# Patient Record
Sex: Female | Born: 1960 | Race: White | Hispanic: No | Smoking: Never smoker
Health system: Southern US, Community
[De-identification: ages and names within clinical notes are randomized; demographics above are authoritative.]

## PROBLEM LIST (undated history)

## (undated) DIAGNOSIS — R87610 Atypical squamous cells of undetermined significance on cytologic smear of cervix (ASC-US): Secondary | ICD-10-CM

## (undated) DIAGNOSIS — I10 Essential (primary) hypertension: Secondary | ICD-10-CM

## (undated) DIAGNOSIS — J45909 Unspecified asthma, uncomplicated: Secondary | ICD-10-CM

## (undated) DIAGNOSIS — R945 Abnormal results of liver function studies: Secondary | ICD-10-CM

## (undated) DIAGNOSIS — E669 Obesity, unspecified: Secondary | ICD-10-CM

## (undated) DIAGNOSIS — K3184 Gastroparesis: Secondary | ICD-10-CM

## (undated) DIAGNOSIS — E039 Hypothyroidism, unspecified: Secondary | ICD-10-CM

## (undated) DIAGNOSIS — K219 Gastro-esophageal reflux disease without esophagitis: Secondary | ICD-10-CM

## (undated) DIAGNOSIS — K7689 Other specified diseases of liver: Secondary | ICD-10-CM

## (undated) DIAGNOSIS — F329 Major depressive disorder, single episode, unspecified: Secondary | ICD-10-CM

## (undated) DIAGNOSIS — J309 Allergic rhinitis, unspecified: Secondary | ICD-10-CM

## (undated) DIAGNOSIS — F411 Generalized anxiety disorder: Secondary | ICD-10-CM

## (undated) DIAGNOSIS — L650 Telogen effluvium: Secondary | ICD-10-CM

## (undated) DIAGNOSIS — D509 Iron deficiency anemia, unspecified: Secondary | ICD-10-CM

## (undated) DIAGNOSIS — R42 Dizziness and giddiness: Secondary | ICD-10-CM

## (undated) DIAGNOSIS — R740 Nonspecific elevation of levels of transaminase and lactic acid dehydrogenase [LDH]: Secondary | ICD-10-CM

## (undated) DIAGNOSIS — S63509A Unspecified sprain of unspecified wrist, initial encounter: Secondary | ICD-10-CM

## (undated) HISTORY — DX: Gastroparesis: K31.84

## (undated) HISTORY — DX: Other specified diseases of liver: K76.89

## (undated) HISTORY — DX: Obesity, unspecified: E66.9

## (undated) HISTORY — DX: Major depressive disorder, single episode, unspecified: F32.9

## (undated) HISTORY — DX: Hypothyroidism, unspecified: E03.9

## (undated) HISTORY — PX: ANTERIOR FUSION CERVICAL SPINE: SUR626

## (undated) HISTORY — DX: Unspecified sprain of unspecified wrist, initial encounter: S63.509A

## (undated) HISTORY — DX: Telogen effluvium: L65.0

## (undated) HISTORY — PX: CATARACT EXTRACTION: SUR2

## (undated) HISTORY — PX: TUBAL LIGATION: SHX77

## (undated) HISTORY — DX: Unspecified asthma, uncomplicated: J45.909

## (undated) HISTORY — PX: KNEE ARTHROSCOPY: SHX127

## (undated) HISTORY — DX: Essential (primary) hypertension: I10

## (undated) HISTORY — PX: KNEE SURGERY: SHX244

## (undated) HISTORY — DX: Abnormal results of liver function studies: R94.5

## (undated) HISTORY — DX: Gastro-esophageal reflux disease without esophagitis: K21.9

## (undated) HISTORY — DX: Nonspecific elevation of levels of transaminase and lactic acid dehydrogenase (ldh): R74.0

## (undated) HISTORY — PX: POSTERIOR CERVICAL LAMINECTOMY: SHX2248

## (undated) HISTORY — DX: Allergic rhinitis, unspecified: J30.9

## (undated) HISTORY — DX: Iron deficiency anemia, unspecified: D50.9

## (undated) HISTORY — DX: Generalized anxiety disorder: F41.1

## (undated) HISTORY — DX: Atypical squamous cells of undetermined significance on cytologic smear of cervix (ASC-US): R87.610

## (undated) HISTORY — DX: Dizziness and giddiness: R42

---

## 1998-01-21 ENCOUNTER — Encounter: Payer: Self-pay | Admitting: Family Medicine

## 1998-01-21 LAB — CONVERTED CEMR LAB: Pap Smear: NORMAL

## 2005-03-04 ENCOUNTER — Emergency Department (HOSPITAL_COMMUNITY): Admission: EM | Admit: 2005-03-04 | Discharge: 2005-03-05 | Payer: Self-pay | Admitting: Emergency Medicine

## 2005-10-17 ENCOUNTER — Ambulatory Visit: Payer: Self-pay | Admitting: Internal Medicine

## 2005-11-22 ENCOUNTER — Ambulatory Visit: Payer: Self-pay | Admitting: Family Medicine

## 2006-01-22 ENCOUNTER — Ambulatory Visit: Payer: Self-pay | Admitting: Family Medicine

## 2006-02-04 ENCOUNTER — Ambulatory Visit: Payer: Self-pay | Admitting: Family Medicine

## 2006-02-04 LAB — CONVERTED CEMR LAB
AST: 29 units/L (ref 0–37)
Alkaline Phosphatase: 66 units/L (ref 39–117)
Calcium: 9.3 mg/dL (ref 8.4–10.5)
Creatinine, Ser: 1.2 mg/dL (ref 0.4–1.2)
Glucose, Bld: 100 mg/dL — ABNORMAL HIGH (ref 70–99)
HDL: 42 mg/dL (ref >39.0)
TSH: 1.63 microintl units/mL (ref 0.35–5.50)
Total Bilirubin: 1 mg/dL (ref 0.3–1.2)
Total Protein: 7 g/dL (ref 6.0–8.3)
VLDL: 15 mg/dL (ref 0–40)

## 2006-05-02 ENCOUNTER — Ambulatory Visit: Payer: Self-pay | Admitting: Family Medicine

## 2006-09-12 ENCOUNTER — Telehealth: Payer: Self-pay | Admitting: Family Medicine

## 2007-02-06 ENCOUNTER — Encounter: Payer: Self-pay | Admitting: Family Medicine

## 2007-02-06 DIAGNOSIS — E039 Hypothyroidism, unspecified: Secondary | ICD-10-CM

## 2007-02-06 DIAGNOSIS — J309 Allergic rhinitis, unspecified: Secondary | ICD-10-CM

## 2007-02-06 DIAGNOSIS — J45909 Unspecified asthma, uncomplicated: Secondary | ICD-10-CM

## 2007-02-06 DIAGNOSIS — F320 Major depressive disorder, single episode, mild: Secondary | ICD-10-CM | POA: Insufficient documentation

## 2007-02-06 DIAGNOSIS — F329 Major depressive disorder, single episode, unspecified: Secondary | ICD-10-CM

## 2007-02-06 DIAGNOSIS — E669 Obesity, unspecified: Secondary | ICD-10-CM

## 2007-02-06 DIAGNOSIS — K219 Gastro-esophageal reflux disease without esophagitis: Secondary | ICD-10-CM

## 2007-02-06 DIAGNOSIS — F3289 Other specified depressive episodes: Secondary | ICD-10-CM

## 2007-02-06 HISTORY — DX: Major depressive disorder, single episode, unspecified: F32.9

## 2007-02-06 HISTORY — DX: Allergic rhinitis, unspecified: J30.9

## 2007-02-06 HISTORY — DX: Gastro-esophageal reflux disease without esophagitis: K21.9

## 2007-02-06 HISTORY — DX: Unspecified asthma, uncomplicated: J45.909

## 2007-02-06 HISTORY — DX: Other specified depressive episodes: F32.89

## 2007-02-06 HISTORY — DX: Obesity, unspecified: E66.9

## 2007-02-06 HISTORY — DX: Hypothyroidism, unspecified: E03.9

## 2007-02-09 ENCOUNTER — Encounter (INDEPENDENT_AMBULATORY_CARE_PROVIDER_SITE_OTHER): Payer: Self-pay | Admitting: *Deleted

## 2007-02-09 ENCOUNTER — Ambulatory Visit: Payer: Self-pay | Admitting: Family Medicine

## 2007-02-10 DIAGNOSIS — R945 Abnormal results of liver function studies: Secondary | ICD-10-CM

## 2007-02-10 HISTORY — DX: Abnormal results of liver function studies: R94.5

## 2007-02-10 LAB — CONVERTED CEMR LAB
ALT: 52 units/L — ABNORMAL HIGH (ref 0–35)
CO2: 25 meq/L (ref 19–32)
Calcium: 9 mg/dL (ref 8.4–10.5)
GFR calc non Af Amer: 71 mL/min
HDL: 41.5 mg/dL (ref >39.0)
LDL Cholesterol: 91 mg/dL (ref 0–99)
Potassium: 4.1 meq/L (ref 3.5–5.1)
Sodium: 135 meq/L (ref 135–145)
Total CHOL/HDL Ratio: 3.7
Total Protein: 6.8 g/dL (ref 6.0–8.3)
Triglycerides: 109 mg/dL (ref 0–149)

## 2007-02-13 ENCOUNTER — Ambulatory Visit: Payer: Self-pay | Admitting: Family Medicine

## 2007-02-13 DIAGNOSIS — R42 Dizziness and giddiness: Secondary | ICD-10-CM

## 2007-02-13 HISTORY — DX: Dizziness and giddiness: R42

## 2007-02-25 ENCOUNTER — Ambulatory Visit: Payer: Self-pay | Admitting: Family Medicine

## 2007-03-04 LAB — CONVERTED CEMR LAB
ALT: 65 units/L — ABNORMAL HIGH (ref 0–35)
AST: 59 units/L — ABNORMAL HIGH (ref 0–37)
Alkaline Phosphatase: 66 units/L (ref 39–117)
Bilirubin, Direct: 0.1 mg/dL (ref 0.0–0.3)
Eosinophils Relative: 4.9 % (ref 0.0–5.0)
HCT: 36.6 % (ref 36.0–46.0)
Hemoglobin: 12.3 g/dL (ref 12.0–15.0)
Hgb A1c MFr Bld: 5.2 % (ref 4.6–6.0)
Lymphocytes Relative: 26.5 % (ref 12.0–46.0)
MCHC: 33.5 g/dL (ref 30.0–36.0)
MCV: 84.7 fL (ref 78.0–100.0)
Monocytes Relative: 5.5 % (ref 3.0–11.0)
Platelets: 234 10*3/uL (ref 150–400)
Total Bilirubin: 0.8 mg/dL (ref 0.3–1.2)

## 2007-03-09 ENCOUNTER — Encounter: Admission: RE | Admit: 2007-03-09 | Discharge: 2007-03-09 | Payer: Self-pay | Admitting: Family Medicine

## 2007-04-16 ENCOUNTER — Encounter: Payer: Self-pay | Admitting: Family Medicine

## 2007-04-16 ENCOUNTER — Ambulatory Visit: Payer: Self-pay | Admitting: Family Medicine

## 2007-04-16 ENCOUNTER — Other Ambulatory Visit: Admission: RE | Admit: 2007-04-16 | Discharge: 2007-04-16 | Payer: Self-pay | Admitting: Family Medicine

## 2007-04-16 DIAGNOSIS — I10 Essential (primary) hypertension: Secondary | ICD-10-CM

## 2007-04-16 HISTORY — DX: Essential (primary) hypertension: I10

## 2007-04-23 DIAGNOSIS — R87610 Atypical squamous cells of undetermined significance on cytologic smear of cervix (ASC-US): Secondary | ICD-10-CM | POA: Insufficient documentation

## 2007-04-23 HISTORY — DX: Atypical squamous cells of undetermined significance on cytologic smear of cervix (ASC-US): R87.610

## 2007-04-28 ENCOUNTER — Encounter: Admission: RE | Admit: 2007-04-28 | Discharge: 2007-04-28 | Payer: Self-pay | Admitting: Family Medicine

## 2007-05-19 ENCOUNTER — Ambulatory Visit: Payer: Self-pay | Admitting: Family Medicine

## 2007-05-19 DIAGNOSIS — R002 Palpitations: Secondary | ICD-10-CM | POA: Insufficient documentation

## 2007-05-19 DIAGNOSIS — R0789 Other chest pain: Secondary | ICD-10-CM | POA: Insufficient documentation

## 2007-05-19 DIAGNOSIS — R1011 Right upper quadrant pain: Secondary | ICD-10-CM | POA: Insufficient documentation

## 2007-06-02 ENCOUNTER — Ambulatory Visit: Payer: Self-pay | Admitting: Gastroenterology

## 2007-06-02 DIAGNOSIS — F411 Generalized anxiety disorder: Secondary | ICD-10-CM | POA: Insufficient documentation

## 2007-06-02 DIAGNOSIS — R7402 Elevation of levels of lactic acid dehydrogenase (LDH): Secondary | ICD-10-CM

## 2007-06-02 DIAGNOSIS — R7401 Elevation of levels of liver transaminase levels: Secondary | ICD-10-CM

## 2007-06-02 HISTORY — DX: Generalized anxiety disorder: F41.1

## 2007-06-02 HISTORY — DX: Elevation of levels of lactic acid dehydrogenase (LDH): R74.02

## 2007-06-02 HISTORY — DX: Elevation of levels of liver transaminase levels: R74.01

## 2007-06-02 LAB — CONVERTED CEMR LAB
A-1 Antitrypsin, Ser: 209 mg/dL — ABNORMAL HIGH (ref 83–200)
Anti Nuclear Antibody(ANA): NEGATIVE
Ceruloplasmin: 45 mg/dL (ref 21–63)

## 2007-06-03 ENCOUNTER — Ambulatory Visit: Payer: Self-pay | Admitting: Cardiology

## 2007-06-08 ENCOUNTER — Encounter: Payer: Self-pay | Admitting: Gastroenterology

## 2007-06-10 ENCOUNTER — Ambulatory Visit: Payer: Self-pay | Admitting: Gastroenterology

## 2007-06-10 LAB — CONVERTED CEMR LAB
AST: 92 units/L — ABNORMAL HIGH (ref 0–37)
Alkaline Phosphatase: 64 units/L (ref 39–117)
Iron: 31 ug/dL — ABNORMAL LOW (ref 42–145)
Saturation Ratios: 6.3 % — ABNORMAL LOW (ref 20.0–50.0)
Transferrin: 351.2 mg/dL (ref >=212.0)

## 2007-06-11 ENCOUNTER — Telehealth: Payer: Self-pay | Admitting: Family Medicine

## 2007-06-12 ENCOUNTER — Telehealth: Payer: Self-pay | Admitting: Family Medicine

## 2007-06-17 ENCOUNTER — Ambulatory Visit: Payer: Self-pay | Admitting: Family Medicine

## 2007-06-17 DIAGNOSIS — D509 Iron deficiency anemia, unspecified: Secondary | ICD-10-CM

## 2007-06-17 DIAGNOSIS — K7689 Other specified diseases of liver: Secondary | ICD-10-CM

## 2007-06-17 HISTORY — DX: Other specified diseases of liver: K76.89

## 2007-06-17 HISTORY — DX: Iron deficiency anemia, unspecified: D50.9

## 2007-06-25 ENCOUNTER — Encounter (INDEPENDENT_AMBULATORY_CARE_PROVIDER_SITE_OTHER): Payer: Self-pay | Admitting: *Deleted

## 2007-06-25 ENCOUNTER — Telehealth: Payer: Self-pay | Admitting: Family Medicine

## 2007-07-03 ENCOUNTER — Ambulatory Visit: Payer: Self-pay | Admitting: Cardiology

## 2007-07-30 ENCOUNTER — Ambulatory Visit: Payer: Self-pay | Admitting: Family Medicine

## 2007-09-01 ENCOUNTER — Ambulatory Visit: Payer: Self-pay | Admitting: Family Medicine

## 2007-09-08 ENCOUNTER — Encounter (INDEPENDENT_AMBULATORY_CARE_PROVIDER_SITE_OTHER): Payer: Self-pay | Admitting: *Deleted

## 2007-12-24 ENCOUNTER — Ambulatory Visit: Payer: Self-pay | Admitting: Family Medicine

## 2007-12-24 DIAGNOSIS — J069 Acute upper respiratory infection, unspecified: Secondary | ICD-10-CM | POA: Insufficient documentation

## 2007-12-28 ENCOUNTER — Telehealth (INDEPENDENT_AMBULATORY_CARE_PROVIDER_SITE_OTHER): Payer: Self-pay | Admitting: Internal Medicine

## 2008-02-22 ENCOUNTER — Ambulatory Visit: Payer: Self-pay | Admitting: Family Medicine

## 2008-03-08 ENCOUNTER — Encounter (INDEPENDENT_AMBULATORY_CARE_PROVIDER_SITE_OTHER): Payer: Self-pay | Admitting: *Deleted

## 2008-03-09 ENCOUNTER — Telehealth: Payer: Self-pay | Admitting: Family Medicine

## 2008-03-18 ENCOUNTER — Ambulatory Visit: Payer: Self-pay | Admitting: Internal Medicine

## 2008-03-18 DIAGNOSIS — I10 Essential (primary) hypertension: Secondary | ICD-10-CM | POA: Insufficient documentation

## 2008-03-18 HISTORY — DX: Essential (primary) hypertension: I10

## 2008-03-28 ENCOUNTER — Telehealth (INDEPENDENT_AMBULATORY_CARE_PROVIDER_SITE_OTHER): Payer: Self-pay

## 2008-03-28 ENCOUNTER — Telehealth: Payer: Self-pay | Admitting: Internal Medicine

## 2008-04-28 ENCOUNTER — Encounter: Admission: RE | Admit: 2008-04-28 | Discharge: 2008-04-28 | Payer: Self-pay | Admitting: Internal Medicine

## 2008-05-23 ENCOUNTER — Telehealth (INDEPENDENT_AMBULATORY_CARE_PROVIDER_SITE_OTHER): Payer: Self-pay

## 2008-05-23 ENCOUNTER — Ambulatory Visit: Payer: Self-pay | Admitting: Internal Medicine

## 2008-05-23 DIAGNOSIS — S63509A Unspecified sprain of unspecified wrist, initial encounter: Secondary | ICD-10-CM

## 2008-05-23 HISTORY — DX: Unspecified sprain of unspecified wrist, initial encounter: S63.509A

## 2008-06-06 ENCOUNTER — Ambulatory Visit: Payer: Self-pay | Admitting: Internal Medicine

## 2008-07-04 ENCOUNTER — Ambulatory Visit: Payer: Self-pay | Admitting: Internal Medicine

## 2008-09-21 ENCOUNTER — Telehealth: Payer: Self-pay | Admitting: Internal Medicine

## 2008-09-21 ENCOUNTER — Ambulatory Visit: Payer: Self-pay | Admitting: Internal Medicine

## 2008-09-21 DIAGNOSIS — R300 Dysuria: Secondary | ICD-10-CM | POA: Insufficient documentation

## 2008-09-23 ENCOUNTER — Telehealth: Payer: Self-pay | Admitting: Internal Medicine

## 2008-10-20 ENCOUNTER — Telehealth: Payer: Self-pay | Admitting: Family Medicine

## 2008-10-20 ENCOUNTER — Emergency Department (HOSPITAL_COMMUNITY): Admission: EM | Admit: 2008-10-20 | Discharge: 2008-10-21 | Payer: Self-pay | Admitting: Emergency Medicine

## 2008-10-21 ENCOUNTER — Ambulatory Visit: Payer: Self-pay | Admitting: Internal Medicine

## 2008-10-21 DIAGNOSIS — K3184 Gastroparesis: Secondary | ICD-10-CM | POA: Insufficient documentation

## 2008-10-21 DIAGNOSIS — R109 Unspecified abdominal pain: Secondary | ICD-10-CM | POA: Insufficient documentation

## 2008-10-21 HISTORY — DX: Gastroparesis: K31.84

## 2008-10-21 LAB — CONVERTED CEMR LAB
Bilirubin Urine: NEGATIVE
Glucose, Urine, Semiquant: NEGATIVE
Ketones, urine, test strip: NEGATIVE
Protein, U semiquant: NEGATIVE
WBC Urine, dipstick: NEGATIVE

## 2008-10-25 ENCOUNTER — Telehealth: Payer: Self-pay | Admitting: Internal Medicine

## 2008-10-27 ENCOUNTER — Telehealth: Payer: Self-pay | Admitting: Internal Medicine

## 2008-11-24 ENCOUNTER — Ambulatory Visit: Payer: Self-pay | Admitting: Internal Medicine

## 2008-12-01 ENCOUNTER — Ambulatory Visit: Payer: Self-pay | Admitting: Internal Medicine

## 2008-12-02 ENCOUNTER — Telehealth: Payer: Self-pay | Admitting: Family Medicine

## 2008-12-05 LAB — CONVERTED CEMR LAB
AST: 17 units/L (ref 0–37)
Albumin: 4.1 g/dL (ref 3.5–5.2)
Chloride: 106 meq/L (ref 96–112)
Eosinophils Absolute: 0.3 10*3/uL (ref 0.0–0.7)
Eosinophils Relative: 4.4 % (ref 0.0–5.0)
Hemoglobin: 10.8 g/dL — ABNORMAL LOW (ref 12.0–15.0)
Lymphs Abs: 1.4 10*3/uL (ref 0.7–4.0)
Monocytes Relative: 3.9 % (ref 3.0–12.0)
Neutro Abs: 4.4 10*3/uL (ref 1.4–7.7)
Neutrophils Relative %: 68.9 % (ref 43.0–77.0)
Platelets: 298 10*3/uL (ref 150.0–400.0)
RDW: 22.7 % — ABNORMAL HIGH (ref 11.5–14.6)
Total Bilirubin: 0.8 mg/dL (ref 0.3–1.2)
Total Protein: 6.7 g/dL (ref 6.0–8.3)
WBC: 6.3 10*3/uL (ref 4.5–10.5)

## 2008-12-06 ENCOUNTER — Telehealth: Payer: Self-pay | Admitting: Internal Medicine

## 2008-12-07 ENCOUNTER — Ambulatory Visit: Payer: Self-pay | Admitting: Family Medicine

## 2008-12-08 ENCOUNTER — Ambulatory Visit (HOSPITAL_COMMUNITY): Admission: RE | Admit: 2008-12-08 | Discharge: 2008-12-08 | Payer: Self-pay | Admitting: Internal Medicine

## 2009-01-26 ENCOUNTER — Ambulatory Visit: Payer: Self-pay | Admitting: Internal Medicine

## 2009-01-26 DIAGNOSIS — L650 Telogen effluvium: Secondary | ICD-10-CM

## 2009-01-26 HISTORY — DX: Telogen effluvium: L65.0

## 2009-03-06 ENCOUNTER — Ambulatory Visit: Payer: Self-pay | Admitting: Internal Medicine

## 2009-03-21 ENCOUNTER — Encounter: Payer: Self-pay | Admitting: Internal Medicine

## 2009-03-21 ENCOUNTER — Telehealth: Payer: Self-pay | Admitting: Internal Medicine

## 2009-03-21 DIAGNOSIS — M79609 Pain in unspecified limb: Secondary | ICD-10-CM | POA: Insufficient documentation

## 2009-03-22 ENCOUNTER — Ambulatory Visit: Payer: Self-pay | Admitting: Internal Medicine

## 2009-05-01 ENCOUNTER — Encounter: Admission: RE | Admit: 2009-05-01 | Discharge: 2009-05-01 | Payer: Self-pay | Admitting: Internal Medicine

## 2009-05-03 ENCOUNTER — Telehealth: Payer: Self-pay | Admitting: Internal Medicine

## 2009-08-10 ENCOUNTER — Ambulatory Visit: Payer: Self-pay | Admitting: Internal Medicine

## 2009-10-05 ENCOUNTER — Telehealth: Payer: Self-pay | Admitting: Internal Medicine

## 2010-01-16 ENCOUNTER — Ambulatory Visit
Admission: RE | Admit: 2010-01-16 | Discharge: 2010-01-16 | Payer: Self-pay | Source: Home / Self Care | Attending: Internal Medicine | Admitting: Internal Medicine

## 2010-02-11 ENCOUNTER — Encounter: Payer: Self-pay | Admitting: Family Medicine

## 2010-02-20 NOTE — Progress Notes (Signed)
Summary: nerves  Phone Note Call from Patient Call back at Home Phone [phone]   Caller: vm Summary of Call: Request med for nerves due to divorce, stress & problems with taxes.  [facility]. Initial call taken by: Shelbie Hutching, RN,  May 03, 2009 3:15 PM  Follow-up for Phone Call        Left message to call back & make ov with Dr. Raliegh Ip.   Follow-up by: Shelbie Hutching, RN,  May 03, 2009 4:33 PM  Additional Follow-up for Phone Call Additional follow up Details #1::        Phone Call Completed Additional Follow-up by: Shelbie Hutching, RN,  May 04, 2009 8:36 AM    New/Updated Medications: LORAZEPAM 0.5 MG TABS (LORAZEPAM) One by mouth two times a day prn Prescriptions: LORAZEPAM 0.5 MG TABS (LORAZEPAM) One by mouth two times a day prn  #50 x 2   Entered by:   Shelbie Hutching, RN   Authorized by:   Marletta Lor  MD   Signed by:   Shelbie Hutching, RN on 05/04/2009   Method used:   Telephoned to ...       [facility]  [phone]* (retail)       [address]       [place], [place]  [address]       Ph: [phone] or [phone]       Fax: [phone]   RxID:   [id number]  lorazepam 0.5  #50 one twice daily as needed  RF 2  Appended Document: nerves Called back to say she will get the Lorazepam & try it.  She will call back as needed.

## 2010-02-20 NOTE — Progress Notes (Signed)
Summary: refill  Phone Note Call from Patient   Caller: Patient Call For: Marletta Lor  MD Summary of Call: Pt would like a refill on Triamcinolone cream sent to [facility] [employer], [place]) [place]. [phone] Initial call taken by: Deanna Artis CMA,  October 05, 2009 10:29 AM  Follow-up for Phone Call        this was removed from med list 08/10/09 visit - please advise .  Follow-up by: Cay Schillings LPN,  October 06, 4095 12:15 PM  Additional Follow-up for Phone Call Additional follow up Details #1::        60 gm Additional Follow-up by: Marletta Lor  MD,  October 05, 2009 1:00 PM    Additional Follow-up for Phone Call Additional follow up Details #2::    added to med list , [facility] , pt aware. KIK Follow-up by: Cay Schillings LPN,  October 06, 3530 1:27 PM  New/Updated Medications: TRIAMCINOLONE ACETONIDE 0.1 % CREA (TRIAMCINOLONE ACETONIDE) apply to affected area bid Prescriptions: TRIAMCINOLONE ACETONIDE 0.1 % CREA (TRIAMCINOLONE ACETONIDE) apply to affected area bid  #60gm x 1   Entered by:   Cay Schillings LPN   Authorized by:   Marletta Lor  MD   Signed by:   Cay Schillings LPN on 99/24/2683   Method used:   Electronically to        [id number] (retail)       [place], [place], [place]  [address]       Ph: [phone]       Fax: [phone]   RxID:   [id number]

## 2010-02-20 NOTE — Miscellaneous (Signed)
Summary: xray order thumb  Clinical Lists Changes  Orders: Added new Test order of T-Hand Left 3 Views (73130TC) - Signed  Appended Document: xray order thumb pt aware xray ordered - gave hours and address of clinic - pt states will do tomorrow. KIK

## 2010-02-20 NOTE — Progress Notes (Signed)
Summary: wants xray ordered  Phone Note Call from Patient Call back at Home Phone [phone]   Caller: Patient--------voicemail Summary of Call: Was seen last month about her thumb. Still having problems. He had mentioned doing an xray. Please order. Initial call taken by: Despina Arias,  March 21, 2009 12:31 PM  New Problems: THUMB PAIN (ICD-729.5)   New Problems: THUMB PAIN (ICD-729.5) Xray Left thumb
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
## 2010-02-20 NOTE — Assessment & Plan Note (Signed)
Summary: jammed finger/njr   Vital Signs:  Patient profile:   50 year old female Weight:      177 pounds Temp:     98.7 degrees F oral BP sitting:   120 / 74  (right arm) Cuff size:   regular  Vitals Entered By: Cay Schillings LPN (March 07, 7671 2:36 PM) CC: jammed l thumb - swelling the AM notied, no bruising , not sure how she did it   Primary Care Provider:  Eliezer Lofts  CC:  jammed l thumb - swelling the AM notied, no bruising , and not sure how she did it.  History of Present Illness: 50 year old patient, who presents with a two-day history of pain involving the left thumb.  She awoke Saturday morning with pain involving the left first MCP joint.  One week prior, she sustained a cat bite over this site.  There is been no fever, chills, or constitutional complaints.  There has been minimal pain and swelling, but no significant redness she has treated hypertension, which has been stable  Allergies: No Known Drug Allergies  Physical Exam  General:  overweight-appearing.  no distress Msk:  small crusted lesion, consistent with a healing puncture wound over the dorsal aspect of the left first MCP joint; tenderness was noted about this joint.  There is no redness, but mild tenderness and soft tissue swelling   Impression & Recommendations:  Problem # 1:  HYPERTENSION (ICD-401.9)  Her updated medication list for this problem includes:    Hydrochlorothiazide 25 Mg Tabs (Hydrochlorothiazide) .[name] Kitchen... Take 1 tablet by mouth once a day  Problem # 2:  CAT BITE (ICD-E906.3) was treated for a possible infectious complication of the cat bite.  Will treat with 10 days of Augmentin.  She will call if there are any signs of clinical worsening  Complete Medication List: 1)  Alprazolam 0.25 Mg Tabs (Alprazolam) .[name] Kitchen.. 1-2 tab by mouth  once daily as needed anxiety 2)  Triamcinolone Acetonide 0.1 % Crea (Triamcinolone acetonide) .... Apply 1 a small amount to affected area twice a day  as needed 3)  Glucosamine Sulfate Powd (Glucosamine sulfate) .... Takes 2 tablet by mouth daily 4)  Hydrochlorothiazide 25 Mg Tabs (Hydrochlorothiazide) .... Take 1 tablet by mouth once a day 5)  Cinnamon 500 Mg Caps (Cinnamon) .... Once daily 6)  Kelp Tabs (Iodine (kelp)) .... Once daily 7)  Ventolin Hfa 108 (90 Base) Mcg/act Aers (Albuterol sulfate) .... 2 puffs every 4hrs as needed wheezing or chest tightness 8)  Advair Diskus 250-50 Mcg/dose Misc (Fluticasone-salmeterol) .... One inhalation twice a day. 9)  Singulair 10 Mg Tabs (Montelukast sodium) .... One tab by mouth once a day. 10)  Nexium 40 Mg Cpdr (Esomeprazole magnesium) .... Take 1 capsule by mouth once a day 11)  Bupropion Hcl 300 Mg Xr24h-tab (Bupropion hcl) .... One daily 12)  Tramadol Hcl 50 Mg Tabs (Tramadol hcl) .... One by mouth q 6 hours as needed pain. 13)  Ferrous Fumarate 325 Mg Tabs (Ferrous fumarate) .[name] Kitchen.. 1 by mouth daily 14)  Amoxicillin-pot Clavulanate 875-125 Mg Tabs (Amoxicillin-pot clavulanate) .... One twice daily  Patient Instructions: 1)  call if any worsening pain, swelling, or redness or if  you  develop fever 2)  Take your antibiotic as prescribed until ALL of it is gone, but stop if you develop a rash or swelling and contact our office as soon as possible. Prescriptions: AMOXICILLIN-POT CLAVULANATE 875-125 MG TABS (AMOXICILLIN-POT CLAVULANATE) one twice daily  #20 x 0  Entered and Authorized by:   Marletta Lor  MD   Signed by:   Marletta Lor  MD on 03/06/2009   Method used:   Print then Give to Patient   RxID:   [id number]

## 2010-02-22 NOTE — Assessment & Plan Note (Signed)
Summary: ?spider bite on foot/pts toe is black/cjr   Vital Signs:  Patient profile:   50 year old female Weight:      182 pounds Temp:     98.6 degrees F oral BP sitting:   116 / 80  (left arm) Cuff size:   regular  Vitals Entered By: Cay Schillings LPN (January 17, 5915 10:20 AM) CC: ??insect bite to (L) small toe area - infection? Is Patient Diabetic? No   Primary Care Provider:  Eliezer Lofts  CC:  ??insect bite to (L) small toe area - infection?.  History of Present Illness: 20 -year-old patient, who is seen today for evaluation of a wound involving her left fifth toe.  She was concerned about a possible insect or spider bite. It began as a small puncture-appearing wound.  there's been no fever or systemic complaints.  She does have treated hypertension, which has been stable and also a history of asthma. she also has some chronic eczema to this dermatitis involving the elbow areas primarily.  This does respond nicely to triamcinolone, but frequently recur  Allergies (verified): No Known Drug Allergies  Review of Systems       The patient complains of suspicious skin lesions.  The patient denies anorexia, fever, weight loss, weight gain, vision loss, decreased hearing, hoarseness, chest pain, syncope, dyspnea on exertion, peripheral edema, prolonged cough, headaches, hemoptysis, abdominal pain, melena, hematochezia, severe indigestion/heartburn, hematuria, incontinence, genital sores, muscle weakness, transient blindness, difficulty walking, depression, unusual weight change, abnormal bleeding, enlarged lymph nodes, angioedema, and breast masses.    Physical Exam  General:  Well-developed,well-nourished,in no acute distress; alert,appropriate and cooperative throughout examination; normal blood pressure Skin:  patient had a scaly, erythematous dermatitis involving the dorsal aspect of her left fifth toe.  There was some denuded skin in the interdigital area of the fourth and  fifth toe   Impression & Recommendations:  Problem # 1:  HYPERTENSION (ICD-401.9)  Her updated medication list for this problem includes:    Hydrochlorothiazide 25 Mg Tabs (Hydrochlorothiazide) .[name] Kitchen... Take 1 tablet by mouth once a day  Complete Medication List: 1)  Alprazolam 0.25 Mg Tabs (Alprazolam) .[name] Kitchen.. 1-2 tab by mouth  once daily as needed anxiety 2)  Glucosamine Sulfate Powd (Glucosamine sulfate) .... Takes 2 tablet by mouth daily 3)  Hydrochlorothiazide 25 Mg Tabs (Hydrochlorothiazide) .... Take 1 tablet by mouth once a day 4)  Ventolin Hfa 108 (90 Base) Mcg/act Aers (Albuterol sulfate) .... 2 puffs every 4hrs as needed wheezing or chest tightness 5)  Advair Diskus 250-50 Mcg/dose Misc (Fluticasone-salmeterol) .... One inhalation twice a day. 6)  Singulair 10 Mg Tabs (Montelukast sodium) .... One tab by mouth once a day. 7)  Nexium 40 Mg Cpdr (Esomeprazole magnesium) .... Take 1 capsule by mouth twice daily 8)  Bupropion Hcl 300 Mg Xr24h-tab (Bupropion hcl) .... One daily 9)  Tramadol Hcl 50 Mg Tabs (Tramadol hcl) .... One by mouth q 6 hours as needed pain. 10)  Ferrous Fumarate 325 Mg Tabs (Ferrous fumarate) .[name] Kitchen.. 1 by mouth daily 11)  Lorazepam 0.5 Mg Tabs (Lorazepam) .... One by mouth two times a day prn 12)  Betamethasone Dipropionate Aug 0.05 % Crea (Betamethasone dipropionate aug) .... Use twice daily 13)  Clotrimazole-betamethasone 1-0.05 % Crea (Clotrimazole-betamethasone) .... Apply twice daily  Patient Instructions: 1)  Please schedule a follow-up appointment as needed. 2)  Please schedule a follow-up appointment in 6 months for annual exam  Prescriptions: CLOTRIMAZOLE-BETAMETHASONE 1-0.05 % CREA (CLOTRIMAZOLE-BETAMETHASONE) apply twice  daily  #30 gm x 0   Entered and Authorized by:   Marletta Lor  MD   Signed by:   Marletta Lor  MD on 01/16/2010   Method used:   Print then Give to Patient   RxID:   [id number] BETAMETHASONE DIPROPIONATE AUG 0.05 %  CREA (BETAMETHASONE DIPROPIONATE AUG) use twice daily  #120 gm x 2   Entered and Authorized by:   Marletta Lor  MD   Signed by:   Marletta Lor  MD on 01/16/2010   Method used:   Print then Give to Patient   RxID:   [id number] BUPROPION HCL 300 MG XR24H-TAB (BUPROPION HCL) one daily  #90 Each x 2   Entered and Authorized by:   Marletta Lor  MD   Signed by:   Marletta Lor  MD on 01/16/2010   Method used:   Print then Give to Patient   RxID:   [id number] [place] 40 MG CPDR (ESOMEPRAZOLE MAGNESIUM) Take 1 capsule by mouth twice daily  #180 x 4   Entered and Authorized by:   Marletta Lor  MD   Signed by:   Marletta Lor  MD on 01/16/2010   Method used:   Print then Give to Patient   RxID:   [id number] SINGULAIR 10 MG TABS (MONTELUKAST SODIUM) one tab by mouth once a day.  #90 x 3   Entered and Authorized by:   Marletta Lor  MD   Signed by:   Marletta Lor  MD on 01/16/2010   Method used:   Print then Give to Patient   RxID:   [id number] ADVAIR DISKUS 250-50 MCG/DOSE MISC (FLUTICASONE-SALMETEROL) one inhalation twice a day.  #3 x 3   Entered and Authorized by:   Marletta Lor  MD   Signed by:   Marletta Lor  MD on 01/16/2010   Method used:   Print then Give to Patient   RxID:   [id number] VENTOLIN HFA 108 (90 BASE) MCG/ACT AERS (ALBUTEROL SULFATE) 2 puffs every 4hrs as needed wheezing or chest tightness  #3 x 3   Entered and Authorized by:   Marletta Lor  MD   Signed by:   Marletta Lor  MD on 01/16/2010   Method used:   Print then Give to Patient   RxID:   [id number] HYDROCHLOROTHIAZIDE 25 MG  TABS (HYDROCHLOROTHIAZIDE) Take 1 tablet by mouth once a day  #90 x 4   Entered and Authorized by:   Marletta Lor  MD   Signed by:   Marletta Lor  MD on 01/16/2010   Method used:   Print then Give to Patient   RxID:   [id number]

## 2010-03-28 ENCOUNTER — Encounter: Payer: Self-pay | Admitting: Internal Medicine

## 2010-03-29 ENCOUNTER — Ambulatory Visit (INDEPENDENT_AMBULATORY_CARE_PROVIDER_SITE_OTHER): Payer: Self-pay | Admitting: Internal Medicine

## 2010-03-29 ENCOUNTER — Encounter: Payer: Self-pay | Admitting: Internal Medicine

## 2010-03-29 VITALS — BP 110/70 | Temp 98.7°F | Wt 184.0 lb

## 2010-03-29 DIAGNOSIS — M549 Dorsalgia, unspecified: Secondary | ICD-10-CM

## 2010-03-29 DIAGNOSIS — F411 Generalized anxiety disorder: Secondary | ICD-10-CM

## 2010-03-29 DIAGNOSIS — I10 Essential (primary) hypertension: Secondary | ICD-10-CM

## 2010-03-29 DIAGNOSIS — J45909 Unspecified asthma, uncomplicated: Secondary | ICD-10-CM

## 2010-03-29 LAB — POCT URINALYSIS DIPSTICK
Ketones, UA: NEGATIVE
Spec Grav, UA: 1.02
pH, UA: 6

## 2010-03-29 MED ORDER — HYDROCODONE-ACETAMINOPHEN 5-500 MG PO TABS: 1.0000 | ORAL_TABLET | ORAL | Status: DC | PRN

## 2010-03-29 MED ORDER — CIPROFLOXACIN HCL 500 MG PO TABS: 500.0000 mg | ORAL_TABLET | Freq: Two times a day (BID) | ORAL | Status: DC

## 2010-03-29 MED ORDER — CIPROFLOXACIN HCL 500 MG PO TABS: 500.0000 mg | ORAL_TABLET | Freq: Two times a day (BID) | ORAL | Status: AC

## 2010-03-29 NOTE — Patient Instructions (Signed)
Most patients with low back pain will improve with time over the next two to 6 weeks.  Keep active but avoid any activities that cause pain.  Apply moist heat to the low back area several times daily.  Call or return to clinic prn if these symptoms worsen or fail to improve as anticipated.  Take your antibiotic as prescribed until ALL of it is gone, but stop if you develop a rash, swelling, or any side effects of the medication.  Contact our office as soon as possible if  there are side effects of the medication.

## 2010-03-29 NOTE — Progress Notes (Signed)
  Subjective:    Patient ID: Sandra Freeman, female    DOB: 12-31-1960, 50 y.o.   MRN: [id number]  HPI   50 year old patient who presents with a number of complaints. Her chief complaint is low back pain this has been a problem intermittently in the past. She also complains of concentrated foul-smelling urine and some occasional dysuria. Due to back pain she has been taking Aleve and has also developed some epigastric discomfort. She is now out of insurance and no longer is on Nexium. Denies any fever chills or flank pain. She has been under considerable situational stress. In addition she has been taking some Vicodin and tramadol.    Review of Systems  Constitutional: Negative.   HENT: Negative for hearing loss, congestion, sore throat, rhinorrhea, dental problem, sinus pressure and tinnitus.   Eyes: Negative for pain, discharge and visual disturbance.  Respiratory: Negative for cough and shortness of breath.   Cardiovascular: Negative for chest pain, palpitations and leg swelling.  Gastrointestinal: Positive for abdominal pain. Negative for nausea, vomiting, diarrhea, constipation, blood in stool and abdominal distention.  Genitourinary: Negative for dysuria, urgency, frequency, hematuria, flank pain, vaginal bleeding, vaginal discharge, difficulty urinating, vaginal pain and pelvic pain.  Musculoskeletal: Positive for back pain. Negative for joint swelling, arthralgias and gait problem.  Skin: Negative for rash.  Neurological: Negative for dizziness, syncope, speech difficulty, weakness, numbness and headaches.  Hematological: Negative for adenopathy.  Psychiatric/Behavioral: Negative for behavioral problems, dysphoric mood and agitation. The patient is not nervous/anxious.        Objective:   Physical Exam  Constitutional: She is oriented to person, place, and time. She appears well-developed and well-nourished. No distress.        Anxious no distress  HENT:  Head: Normocephalic.    Right Ear: External ear normal.  Left Ear: External ear normal.  Mouth/Throat: Oropharynx is clear and moist.  Eyes: Conjunctivae and EOM are normal. Pupils are equal, round, and reactive to light.  Neck: Normal range of motion. Neck supple. No thyromegaly present.  Cardiovascular: Normal rate, regular rhythm, normal heart sounds and intact distal pulses.   Pulmonary/Chest: Effort normal and breath sounds normal.  Abdominal: Soft. Bowel sounds are normal. She exhibits no mass. There is tenderness.        Mild epigastric tenderness no guarding or rebound  Musculoskeletal: Normal range of motion.        Slight tenderness noted in the right lumbar muscular area;  negative straight leg testing  Lymphadenopathy:    She has no cervical adenopathy.  Neurological: She is alert and oriented to person, place, and time.  Skin: Skin is warm and dry. No rash noted.  Psychiatric: She has a normal mood and affect. Her behavior is normal.          Assessment & Plan:   back pain- medicines refilled  Early UTI. We'll treat with short course of Cipro  Abdominal pain probably aggravated by anti-inflammatory drug use. Will place on PPI

## 2010-04-19 ENCOUNTER — Telehealth: Payer: Self-pay | Admitting: *Deleted

## 2010-04-19 NOTE — Telephone Encounter (Signed)
Pt was seen 3 weeks ago for a kidney infection, she thinks its back.  Scheduled appt for 04/20/10 at 8

## 2010-04-20 ENCOUNTER — Ambulatory Visit (INDEPENDENT_AMBULATORY_CARE_PROVIDER_SITE_OTHER): Payer: Self-pay | Admitting: Internal Medicine

## 2010-04-20 ENCOUNTER — Encounter: Payer: Self-pay | Admitting: Internal Medicine

## 2010-04-20 VITALS — BP 100/70 | HR 72 | Temp 98.1°F | Wt 185.0 lb

## 2010-04-20 DIAGNOSIS — I10 Essential (primary) hypertension: Secondary | ICD-10-CM

## 2010-04-20 DIAGNOSIS — M549 Dorsalgia, unspecified: Secondary | ICD-10-CM

## 2010-04-20 LAB — POCT URINALYSIS DIPSTICK
Glucose, UA: NEGATIVE
Ketones, UA: NEGATIVE
Protein, UA: NEGATIVE
pH, UA: 5

## 2010-04-20 MED ORDER — HYDROCODONE-ACETAMINOPHEN 5-500 MG PO TABS: 1.0000 | ORAL_TABLET | ORAL | Status: DC | PRN

## 2010-04-20 NOTE — Progress Notes (Signed)
  Subjective:    Patient ID: Sandra Freeman, female    DOB: 01-16-1961, 50 y.o.   MRN: [id number]  HPI 50 year old patient who was seen 3 weeks ago with lumbar pain. She also has some dysuria and urinalysis did confirm some pyuria. She was treated with Cipro for a UTI. She presently has no further dysuria but now has right flank discomfort. Pain waxes and wanes and at times becomes quite painful. No prior history of kidney stones. Urinalysis today revealed small amount of blood otherwise was normal. The patient has no health insurance    Review of Systems  Constitutional: Negative.   HENT: Negative for hearing loss, congestion, sore throat, rhinorrhea, dental problem, sinus pressure and tinnitus.   Eyes: Negative for pain, discharge and visual disturbance.  Respiratory: Negative for cough and shortness of breath.   Cardiovascular: Negative for chest pain, palpitations and leg swelling.  Gastrointestinal: Negative for nausea, vomiting, abdominal pain, diarrhea, constipation, blood in stool and abdominal distention.  Genitourinary: Positive for flank pain. Negative for dysuria, urgency, frequency, hematuria, vaginal bleeding, vaginal discharge, difficulty urinating, vaginal pain and pelvic pain.  Musculoskeletal: Positive for back pain. Negative for joint swelling, arthralgias and gait problem.  Skin: Negative for rash.  Neurological: Negative for dizziness, syncope, speech difficulty, weakness, numbness and headaches.  Hematological: Negative for adenopathy.  Psychiatric/Behavioral: Negative for behavioral problems, dysphoric mood and agitation. The patient is not nervous/anxious.        Objective:   Physical Exam  Constitutional: She appears well-developed and well-nourished. No distress.       Overweight no acute distress  Abdominal: Soft. Bowel sounds are normal.       Suggestion of some mild right CVA tenderness  Musculoskeletal: Normal range of motion. She exhibits no edema and no  tenderness.       No lumbar tenderness straight leg test was negative range of motion of the hip area intact          Assessment & Plan:  Right flank pain with hematuria. Suspect symptomatic nephrolithiasis. Options were discussed including KUB CT urogram urology referral. She wishes to force fluids and continue symptomatic treatment at least over the weekend due to cost considerations we will pursue these other options if she fails to improve or worsens. A new prescription for Vicodin dispense

## 2010-04-20 NOTE — Patient Instructions (Signed)
Force fluids Hold hydrochlorothiazide  Call if symptoms worsen or fail to improve for urological referral

## 2010-04-27 LAB — URINE MICROSCOPIC-ADD ON

## 2010-04-27 LAB — URINE CULTURE

## 2010-04-27 LAB — COMPREHENSIVE METABOLIC PANEL
AST: 15 U/L (ref 0–37)
Albumin: 4.1 g/dL (ref 3.5–5.2)
Alkaline Phosphatase: 59 U/L (ref 39–117)
BUN: 10 mg/dL (ref 6–23)
GFR calc Af Amer: >60 mL/min (ref >60)
Potassium: 3.6 mEq/L (ref 3.5–5.1)
Sodium: 135 mEq/L (ref 135–145)
Total Protein: 7.5 g/dL (ref 6.0–8.3)

## 2010-04-27 LAB — URINALYSIS, ROUTINE W REFLEX MICROSCOPIC
Glucose, UA: NEGATIVE mg/dL
Hgb urine dipstick: NEGATIVE
Protein, ur: 30 mg/dL — AB
Specific Gravity, Urine: 1.028 (ref 1.005–1.030)

## 2010-04-27 LAB — DIFFERENTIAL
Basophils Relative: 0 % (ref 0–1)
Monocytes Absolute: 0.5 10*3/uL (ref 0.1–1.0)
Monocytes Relative: 4 % (ref 3–12)
Neutro Abs: 9.3 10*3/uL — ABNORMAL HIGH (ref 1.7–7.7)

## 2010-04-27 LAB — CBC
Platelets: 241 10*3/uL (ref 150–400)
RDW: 20.8 % — ABNORMAL HIGH (ref 11.5–15.5)
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
## 2010-12-21 ENCOUNTER — Other Ambulatory Visit: Payer: Self-pay | Admitting: Internal Medicine

## 2011-03-04 ENCOUNTER — Other Ambulatory Visit: Payer: Self-pay | Admitting: Internal Medicine

## 2011-03-07 ENCOUNTER — Encounter: Payer: Self-pay | Admitting: Internal Medicine

## 2011-03-15 ENCOUNTER — Ambulatory Visit (INDEPENDENT_AMBULATORY_CARE_PROVIDER_SITE_OTHER): Payer: 59 | Admitting: Internal Medicine

## 2011-03-15 ENCOUNTER — Encounter: Payer: Self-pay | Admitting: Internal Medicine

## 2011-03-15 DIAGNOSIS — E039 Hypothyroidism, unspecified: Secondary | ICD-10-CM

## 2011-03-15 DIAGNOSIS — F329 Major depressive disorder, single episode, unspecified: Secondary | ICD-10-CM

## 2011-03-15 DIAGNOSIS — E669 Obesity, unspecified: Secondary | ICD-10-CM

## 2011-03-15 DIAGNOSIS — Z0000 Encounter for general adult medical examination without abnormal findings: Secondary | ICD-10-CM

## 2011-03-15 DIAGNOSIS — R300 Dysuria: Secondary | ICD-10-CM

## 2011-03-15 DIAGNOSIS — J45909 Unspecified asthma, uncomplicated: Secondary | ICD-10-CM

## 2011-03-15 DIAGNOSIS — R109 Unspecified abdominal pain: Secondary | ICD-10-CM

## 2011-03-15 LAB — CBC WITH DIFFERENTIAL/PLATELET
Basophils Absolute: 0.1 10*3/uL (ref 0.0–0.1)
Basophils Relative: 0.8 % (ref 0.0–3.0)
Eosinophils Relative: 3.3 % (ref 0.0–5.0)
HCT: 40.9 % (ref 36.0–46.0)
Hemoglobin: 13.6 g/dL (ref 12.0–15.0)
Lymphocytes Relative: 23.7 % (ref 12.0–46.0)
Lymphs Abs: 1.5 10*3/uL (ref 0.7–4.0)
Monocytes Relative: 5.3 % (ref 3.0–12.0)
Neutro Abs: 4.3 10*3/uL (ref 1.4–7.7)
RBC: 4.66 Mil/uL (ref 3.87–5.11)
RDW: 13.6 % (ref 11.5–14.6)
WBC: 6.4 10*3/uL (ref 4.5–10.5)

## 2011-03-15 LAB — POCT URINALYSIS DIPSTICK
Bilirubin, UA: NEGATIVE
Blood, UA: NEGATIVE
Glucose, UA: NEGATIVE
Ketones, UA: NEGATIVE
Nitrite, UA: NEGATIVE

## 2011-03-15 LAB — COMPREHENSIVE METABOLIC PANEL
BUN: 11 mg/dL (ref 6–23)
CO2: 27 mEq/L (ref 19–32)
Calcium: 10.2 mg/dL (ref 8.4–10.5)
Chloride: 104 mEq/L (ref 96–112)
Creatinine, Ser: 1 mg/dL (ref 0.4–1.2)
GFR: 60.25 mL/min (ref >60.00)
Glucose, Bld: 83 mg/dL (ref 70–99)
Total Bilirubin: 0.7 mg/dL (ref 0.3–1.2)

## 2011-03-15 LAB — LIPID PANEL
Cholesterol: 186 mg/dL (ref 0–200)
HDL: 53.7 mg/dL (ref >39.00)
Total CHOL/HDL Ratio: 3
Triglycerides: 102 mg/dL (ref 0.0–149.0)

## 2011-03-15 MED ORDER — BUPROPION HCL ER (XL) 300 MG PO TB24: 300.0000 mg | ORAL_TABLET | ORAL | Status: DC

## 2011-03-15 MED ORDER — ALBUTEROL SULFATE HFA 108 (90 BASE) MCG/ACT IN AERS: 2.0000 | INHALATION_SPRAY | RESPIRATORY_TRACT | Status: DC | PRN

## 2011-03-15 MED ORDER — ALPRAZOLAM 0.25 MG PO TABS: 0.2500 mg | ORAL_TABLET | Freq: Three times a day (TID) | ORAL | Status: DC | PRN

## 2011-03-15 NOTE — Patient Instructions (Signed)
Limit your sodium (Salt) intake    It is important that you exercise regularly, at least 20 minutes 3 to 4 times per week.  If you develop chest pain or shortness of breath seek  medical attention.  You need to lose weight.  Consider a lower calorie diet and regular exercise.  Return in 6 months for follow-up   

## 2011-03-15 NOTE — Progress Notes (Signed)
  Subjective:    Patient ID: Sandra Freeman, female    DOB: [birth year], 51 y.o.   MRN: [id number]  HPI   51 year old patient who has not been seen here in approximately one year she has multiple medical problems that includes mild hypertension. This has been controlled on diuretic therapy in the past but she has been off this medication. She states that she was on this medication as much for fluid retention as for blood pressure control. Her asthma has been stable off maintenance drugs. Complaints today include some bowel discomfort and also dark discolored urine. She has a strong family history of diabetes. She has a history of anxiety depression and Wellbutrin has been resumed. She feels this medication is helpful. She has gastroesophageal reflux disease but has done well off PPI therapy.  She was seen by gynecology a couple of years ago and did have an abnormal Pap.    Review of Systems  Constitutional: Negative.   HENT: Negative for hearing loss, congestion, sore throat, rhinorrhea, dental problem, sinus pressure and tinnitus.   Eyes: Negative for pain, discharge and visual disturbance.  Respiratory: Negative for cough and shortness of breath.   Cardiovascular: Negative for chest pain, palpitations and leg swelling.  Gastrointestinal: Positive for abdominal pain. Negative for nausea, vomiting, diarrhea, constipation, blood in stool and abdominal distention.  Genitourinary: Positive for dysuria. Negative for urgency, frequency, hematuria, flank pain, vaginal bleeding, vaginal discharge, difficulty urinating, vaginal pain and pelvic pain.  Musculoskeletal: Negative for joint swelling, arthralgias and gait problem.  Skin: Negative for rash.  Neurological: Negative for dizziness, syncope, speech difficulty, weakness, numbness and headaches.  Hematological: Negative for adenopathy.  Psychiatric/Behavioral: Negative for behavioral problems, dysphoric mood and agitation. The patient is not  nervous/anxious.        Objective:   Physical Exam  Constitutional: She is oriented to person, place, and time. She appears well-developed and well-nourished.       Mildly overweight. Normal blood pressure  HENT:  Head: Normocephalic.  Right Ear: External ear normal.  Left Ear: External ear normal.  Mouth/Throat: Oropharynx is clear and moist.  Eyes: Conjunctivae and EOM are normal. Pupils are equal, round, and reactive to light.  Neck: Normal range of motion. Neck supple. No thyromegaly present.  Cardiovascular: Normal rate, regular rhythm, normal heart sounds and intact distal pulses.   Pulmonary/Chest: Effort normal and breath sounds normal.  Abdominal: Soft. Bowel sounds are normal. She exhibits no mass. There is no tenderness.       Mild abdominal pain in both upper quadrants  Musculoskeletal: Normal range of motion.  Lymphadenopathy:    She has no cervical adenopathy.  Neurological: She is alert and oriented to person, place, and time.  Skin: Skin is warm and dry. No rash noted.  Psychiatric: She has a normal mood and affect. Her behavior is normal.          Assessment & Plan:   Preventive health exam. We'll check some updated lab. Medications were refilled She has asked to follow up with gynecology due to her abnormal Pap Hypertension stable off medications Gastroesophageal reflux disease stable off medication Asthma. Will resume albuterol rescue only  Laboratory  update  reviewed Recheck 6 months

## 2011-03-21 NOTE — Progress Notes (Signed)
This encounter was created in error - please disregard.

## 2011-04-05 ENCOUNTER — Other Ambulatory Visit: Payer: Self-pay | Admitting: *Deleted

## 2011-04-05 MED ORDER — CLOTRIMAZOLE-BETAMETHASONE 1-0.05 % EX CREA: TOPICAL_CREAM | Freq: Two times a day (BID) | CUTANEOUS | Status: DC

## 2011-04-05 NOTE — Telephone Encounter (Signed)
OK  30 gm

## 2011-04-05 NOTE — Telephone Encounter (Signed)
Please advise 

## 2011-04-05 NOTE — Telephone Encounter (Signed)
Pt would like to have Lotrisone called in for a rash on her elbow.  Dr. Raliegh Ip gave her this before,and it had helped a lot.

## 2011-06-30 ENCOUNTER — Other Ambulatory Visit: Payer: Self-pay | Admitting: Internal Medicine

## 2011-08-08 ENCOUNTER — Encounter: Payer: Self-pay | Admitting: Internal Medicine

## 2011-08-08 ENCOUNTER — Ambulatory Visit (INDEPENDENT_AMBULATORY_CARE_PROVIDER_SITE_OTHER)
Admission: RE | Admit: 2011-08-08 | Discharge: 2011-08-08 | Disposition: A | Discharge status: Home / Self Care | Payer: 59 | Source: Ambulatory Visit | Attending: Internal Medicine | Admitting: Internal Medicine

## 2011-08-08 ENCOUNTER — Ambulatory Visit (INDEPENDENT_AMBULATORY_CARE_PROVIDER_SITE_OTHER): Payer: 59 | Admitting: Internal Medicine

## 2011-08-08 VITALS — BP 122/64 | Temp 103.2°F | Wt 179.0 lb

## 2011-08-08 DIAGNOSIS — R091 Pleurisy: Secondary | ICD-10-CM

## 2011-08-08 DIAGNOSIS — R509 Fever, unspecified: Secondary | ICD-10-CM

## 2011-08-08 DIAGNOSIS — I10 Essential (primary) hypertension: Secondary | ICD-10-CM

## 2011-08-08 DIAGNOSIS — R109 Unspecified abdominal pain: Secondary | ICD-10-CM

## 2011-08-08 LAB — POCT URINALYSIS DIPSTICK
Bilirubin, UA: NEGATIVE
Glucose, UA: NEGATIVE
Nitrite, UA: NEGATIVE
Spec Grav, UA: 1.01
Urobilinogen, UA: 1

## 2011-08-08 MED ORDER — LIDOCAINE HCL 1 % IJ SOLN
1.0000 g | Freq: Once | INTRAMUSCULAR | Status: AC
  Administered 2011-08-08: 1 g via INTRAMUSCULAR

## 2011-08-08 MED ORDER — CIPROFLOXACIN HCL 500 MG PO TABS: 500.0000 mg | ORAL_TABLET | Freq: Two times a day (BID) | ORAL | Status: AC

## 2011-08-08 NOTE — Patient Instructions (Addendum)
Take your antibiotic as prescribed until ALL of it is gone, but stop if you develop a rash, swelling, or any side effects of the medication.  Contact our office as soon as possible if  there are side effects of the medication.  Take [phone]  mg of Tylenol every 6 hours as needed for pain relief or fever.  Avoid taking more than 3000 mg in a 24-hour period (  This may cause liver damage).  Call or return to clinic prn if these symptoms worsen or fail to improve as anticipated.

## 2011-08-08 NOTE — Progress Notes (Signed)
Subjective:    Patient ID: Sandra Freeman, female    DOB: July 08, 1960, 51 y.o.   MRN: [id number]  HPI  51 year old patient who presents with a four-day history of fever chills and left posterior pleurisy. Denies cough or any other URI symptoms. No shortness of breath. No GU symptoms. She does have a history of asthma which has been stable. She is treated hypertension. No pulmonary complaints. Associated symptoms include fatigue and some myalgias  Past Medical History  Diagnosis Date  . ALLERGIC RHINITIS 02/06/2007  . ANEMIA, IRON DEFICIENCY 06/17/2007  . ANXIETY DISORDER 06/02/2007  . ASCUS PAP 04/23/2007  . ASTHMA 02/06/2007  . DEPRESSION 02/06/2007  . DIZZINESS 02/13/2007  . Essential hypertension, benign 04/16/2007  . FATTY LIVER DISEASE 06/17/2007  . Gastroparesis 10/21/2008  . GERD 02/06/2007  . HYPERTENSION 03/18/2008  . HYPOTHYROIDISM 02/06/2007  . LIVER FUNCTION TESTS, ABNORMAL 02/10/2007  . NONSPEC ELEVATION OF LEVELS OF TRANSAMINASE/LDH 06/02/2007  . OBESITY 02/06/2007  . 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 WRIST SPRAIN, RIGHT 05/23/2008    History   Social History  . Marital Status: Divorced    Spouse Name: N/A    Number of Children: N/A  . Years of Education: N/A   Occupational History  . Not on file.   Social History Main Topics  . Smoking status: Never Smoker   . Smokeless tobacco: Never Used  . Alcohol Use: Not on file  . Drug Use: Not on file  . Sexually Active: Not on file   Other Topics Concern  . Not on file   Social History Narrative  . No narrative on file    Past Surgical History  Procedure Date  . Tubal ligation   . Knee surgery   . Cataract extraction   . Back surgery   . Knee arthroscopy     left    Family History  Problem Relation Age of Onset  . Cancer Mother     breast  . Heart disease Mother   . COPD Mother   . Diabetes Father   . Cancer Father     lung  . Diabetes Sister   . Diabetes Brother   . Diabetes Brother     No Known  Allergies  Current Outpatient Prescriptions on File Prior to Visit  Medication Sig Dispense Refill  . albuterol (VENTOLIN HFA) 108 (90 BASE) MCG/ACT inhaler Inhale 2 puffs into the lungs every 4 (four) hours as needed.  1 Inhaler  3  . ALPRAZolam (XANAX) 0.25 MG tablet TAKE ONE TO TWO TABLETS BY MOUTH EVERY DAY AS NEEDED  60 tablet  2    BP 122/64  Temp 103.2 F (39.6 C) (Oral)  Wt 179 lb (81.194 kg)     Review of Systems  Constitutional: Positive for fever, chills and fatigue.  HENT: Negative for hearing loss, congestion, sore throat, rhinorrhea, dental problem, sinus pressure and tinnitus.   Eyes: Negative for pain, discharge and visual disturbance.  Respiratory: Negative for cough and shortness of breath.   Cardiovascular: Positive for chest pain. Negative for palpitations and leg swelling.  Gastrointestinal: Negative for nausea, vomiting, abdominal pain, diarrhea, constipation, blood in stool and abdominal distention.  Genitourinary: Negative for dysuria, urgency, frequency, hematuria, flank pain, vaginal bleeding, vaginal discharge, difficulty urinating, vaginal pain and pelvic pain.  Musculoskeletal: Negative for joint swelling, arthralgias and gait problem.  Skin: Negative for rash.  Neurological: Negative for dizziness, syncope, speech difficulty, weakness, numbness and headaches.  Hematological: Negative for adenopathy.  Psychiatric/Behavioral:  Negative for behavioral problems, dysphoric mood and agitation. The patient is not nervous/anxious.        Objective:   Physical Exam  Constitutional: She is oriented to person, place, and time. She appears well-developed and well-nourished. No distress.       Appears unwell but in no acute distress. Blood pressure stable. Temperature 103.2 degrees  HENT:  Head: Normocephalic.  Right Ear: External ear normal.  Left Ear: External ear normal.  Mouth/Throat: Oropharynx is clear and moist.  Eyes: Conjunctivae and EOM are normal.  Pupils are equal, round, and reactive to light.  Neck: Normal range of motion. Neck supple. No thyromegaly present.  Cardiovascular: Normal rate, regular rhythm, normal heart sounds and intact distal pulses.   Pulmonary/Chest: Effort normal and breath sounds normal. No respiratory distress. She has no wheezes. She has no rales.       O2 saturation 96% Pulse rate 104  Abdominal: Soft. Bowel sounds are normal. She exhibits no mass. There is no tenderness.  Musculoskeletal: Normal range of motion. She exhibits no edema.       No edema or calf tenderness  Lymphadenopathy:    She has no cervical adenopathy.  Neurological: She is alert and oriented to person, place, and time.  Skin: Skin is warm and dry. No rash noted.  Psychiatric: She has a normal mood and affect. Her behavior is normal.          Assessment & Plan:   Fever chills pleurisy. Rule out pneumonia. We'll check a UA if this is normal will send for a chest x-ray. Will treat with Rocephin 1 g IM Will treat with Tylenol for fever control If pneumonia is confirmed we'll treat with azithromycin  Urinalysis did reveal moderate pyuria and hematuria. Will treat with Cipro. We'll also check a chest x-ray to exclude pneumonia. In view of suspected pyelonephritis we'll check a urine C&S

## 2011-08-09 ENCOUNTER — Telehealth: Payer: Self-pay

## 2011-08-09 MED ORDER — HYDROCODONE-ACETAMINOPHEN 5-500 MG PO TABS: 1.0000 | ORAL_TABLET | Freq: Four times a day (QID) | ORAL | Status: DC | PRN

## 2011-08-09 NOTE — Telephone Encounter (Signed)
Called in.

## 2011-08-09 NOTE — Telephone Encounter (Signed)
Generic Vicodin 5/500 #30 one every 4-6 hours as needed for pain

## 2011-08-09 NOTE — Telephone Encounter (Signed)
Ave cxr results - states fever better but still had pain in side - would like something for pain , to help her sleep - call to [facility] Please advise

## 2011-08-09 NOTE — Progress Notes (Signed)
Quick Note:  Spoke with pt- informed cxr normal - states fever better but still with pain in side. (see additional phone note) ______

## 2011-08-11 LAB — URINE CULTURE: Colony Count: >=100000

## 2011-08-14 ENCOUNTER — Telehealth: Payer: Self-pay | Admitting: Internal Medicine

## 2011-08-14 NOTE — Telephone Encounter (Signed)
Patient was calling to make sure she was still taking the correct antibiotic for her infection. Informed her that the test results that are finalized show that the antibiotic that she is taking should be the correct medication. Please call her if Cipro is not an appropriate medication per her test results from 08/08/11. Thanks.

## 2011-08-15 NOTE — Telephone Encounter (Signed)
Attempt to call pt- ans mach - LMTCB if questions - culture complete and yes cipro was/is the correct drug for her to be on for UTI - please complete rx

## 2011-09-27 ENCOUNTER — Other Ambulatory Visit: Payer: Self-pay | Admitting: Internal Medicine

## 2011-10-24 ENCOUNTER — Telehealth: Payer: Self-pay | Admitting: Internal Medicine

## 2011-10-24 NOTE — Telephone Encounter (Signed)
Caller: [name]/Patient; Patient Name: Sandra Freeman; PCP: Bluford Kaufmann [facility]); Best Callback Phone Number: [phone] Treated in the past for vaginal discharge after being on an antibiotic and she is having symptoms again: small amount of discharge with odor, no itching or irritation. Afebrile. No urinary sx. She has not been taking antibiotics. Requesting med be called in. Triage per Vaginal Discharge Protocol and advised to be checked within 3 days for "foul smelling or unusual vaginal discharge (such as change in color, odor, or amount)". Advised to try over the Counter Antifungal Cream like Metrogel and to call back if sx not improving or with any other concerns.

## 2011-12-24 ENCOUNTER — Encounter: Payer: Self-pay | Admitting: Internal Medicine

## 2011-12-24 ENCOUNTER — Ambulatory Visit (INDEPENDENT_AMBULATORY_CARE_PROVIDER_SITE_OTHER): Payer: 59 | Admitting: Internal Medicine

## 2011-12-24 VITALS — BP 140/76 | HR 100 | Temp 98.6°F | Resp 20 | Wt 193.0 lb

## 2011-12-24 DIAGNOSIS — J45909 Unspecified asthma, uncomplicated: Secondary | ICD-10-CM

## 2011-12-24 DIAGNOSIS — J069 Acute upper respiratory infection, unspecified: Secondary | ICD-10-CM

## 2011-12-24 NOTE — Progress Notes (Signed)
Subjective:    Patient ID: Sandra Freeman, female    DOB: Aug 22, 1960, 51 y.o.   MRN: [id number]  HPI  51 year old patient who has a history of asthma. She presents with a five-day history of sore throat sinus congestion and large and nonproductive cough. There has been no wheezing. No fever or chills. No chest pain or significant shortness of breath. Past Medical History  Diagnosis Date  . ALLERGIC RHINITIS 02/06/2007  . ANEMIA, IRON DEFICIENCY 06/17/2007  . ANXIETY DISORDER 06/02/2007  . ASCUS PAP 04/23/2007  . ASTHMA 02/06/2007  . DEPRESSION 02/06/2007  . DIZZINESS 02/13/2007  . Essential hypertension, benign 04/16/2007  . FATTY LIVER DISEASE 06/17/2007  . Gastroparesis 10/21/2008  . GERD 02/06/2007  . HYPERTENSION 03/18/2008  . HYPOTHYROIDISM 02/06/2007  . LIVER FUNCTION TESTS, ABNORMAL 02/10/2007  . NONSPEC ELEVATION OF LEVELS OF TRANSAMINASE/LDH 06/02/2007  . OBESITY 02/06/2007  . 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 WRIST SPRAIN, RIGHT 05/23/2008    History   Social History  . Marital Status: Divorced    Spouse Name: N/A    Number of Children: N/A  . Years of Education: N/A   Occupational History  . Not on file.   Social History Main Topics  . Smoking status: Never Smoker   . Smokeless tobacco: Never Used  . Alcohol Use: Not on file  . Drug Use: Not on file  . Sexually Active: Not on file   Other Topics Concern  . Not on file   Social History Narrative  . No narrative on file    Past Surgical History  Procedure Date  . Tubal ligation   . Knee surgery   . Cataract extraction   . Back surgery   . Knee arthroscopy     left    Family History  Problem Relation Age of Onset  . Cancer Mother     breast  . Heart disease Mother   . COPD Mother   . Diabetes Father   . Cancer Father     lung  . Diabetes Sister   . Diabetes Brother   . Diabetes Brother     No Known Allergies  Current Outpatient Prescriptions on File Prior to Visit  Medication Sig Dispense Refill  .  albuterol (VENTOLIN HFA) 108 (90 BASE) MCG/ACT inhaler Inhale 2 puffs into the lungs every 4 (four) hours as needed.  1 Inhaler  3  . ALPRAZolam (XANAX) 0.25 MG tablet TAKE ONE TO TWO TABLETS BY MOUTH EVERY DAY AS NEEDED  60 tablet  2  . Biotin 5000 MCG CAPS Take 1 capsule by mouth daily.      [name] Kitchen buPROPion (WELLBUTRIN XL) 300 MG 24 hr tablet Take 300 mg by mouth daily.      . clotrimazole-betamethasone (LOTRISONE) cream APPLY  TWICE DAILY TOPICALLY  30 g  5  . Copper Gluconate (COPPER CAPS) 2 MG CAPS Take 2 mg by mouth daily.      [name] Kitchen HYDROcodone-acetaminophen (VICODIN) 5-500 MG per tablet Take 1 tablet by mouth every 6 (six) hours as needed for pain.  30 tablet  0  . magnesium gluconate (MAGONATE) 500 MG tablet Take 500 mg by mouth daily.        BP 140/76  Pulse 100  Temp 98.6 F (37 C) (Oral)  Resp 20  Wt 193 lb (87.544 kg)  SpO2 97%  LMP 01/04/2010       Review of Systems  Constitutional: Negative.   HENT: Negative for hearing loss, congestion, sore throat,  rhinorrhea, dental problem, sinus pressure and tinnitus.   Eyes: Negative for pain, discharge and visual disturbance.  Respiratory: Negative for cough and shortness of breath.   Cardiovascular: Negative for chest pain, palpitations and leg swelling.  Gastrointestinal: Negative for nausea, vomiting, abdominal pain, diarrhea, constipation, blood in stool and abdominal distention.  Genitourinary: Negative for dysuria, urgency, frequency, hematuria, flank pain, vaginal bleeding, vaginal discharge, difficulty urinating, vaginal pain and pelvic pain.  Musculoskeletal: Negative for joint swelling, arthralgias and gait problem.  Skin: Negative for rash.  Neurological: Negative for dizziness, syncope, speech difficulty, weakness, numbness and headaches.  Hematological: Negative for adenopathy.  Psychiatric/Behavioral: Negative for behavioral problems, dysphoric mood and agitation. The patient is not nervous/anxious.         Objective:   Physical Exam  Constitutional: She is oriented to person, place, and time. She appears well-developed and well-nourished.  HENT:  Head: Normocephalic.  Right Ear: External ear normal.  Left Ear: External ear normal.  Mouth/Throat: Oropharynx is clear and moist.  Eyes: Conjunctivae normal and EOM are normal. Pupils are equal, round, and reactive to light.  Neck: Normal range of motion. Neck supple. No thyromegaly present.  Cardiovascular: Normal rate, regular rhythm, normal heart sounds and intact distal pulses.   Pulmonary/Chest: Effort normal and breath sounds normal.  Abdominal: Soft. Bowel sounds are normal. She exhibits no mass. There is no tenderness.  Musculoskeletal: Normal range of motion.  Lymphadenopathy:    She has no cervical adenopathy.  Neurological: She is alert and oriented to person, place, and time.  Skin: Skin is warm and dry. No rash noted.  Psychiatric: She has a normal mood and affect. Her behavior is normal.          Assessment & Plan:  Viral URI with cough Asthma stable  We'll treat symptomatically with the Mucinex DM as Cepacol lozengers;  she was told she could take Vicodin every 6 hours as needed for severe cough

## 2011-12-24 NOTE — Patient Instructions (Signed)
Acute bronchitis symptoms for less than 10 days are generally not helped by antibiotics.  Take over-the-counter expectorants and cough medications such as  Mucinex DM.  Call if there is no improvement in 5 to 7 days or if he developed worsening cough, fever, or new symptoms, such as shortness of breath or chest pain.

## 2012-01-27 ENCOUNTER — Encounter: Payer: Self-pay | Admitting: Internal Medicine

## 2012-01-27 ENCOUNTER — Ambulatory Visit (INDEPENDENT_AMBULATORY_CARE_PROVIDER_SITE_OTHER): Payer: 59 | Admitting: Internal Medicine

## 2012-01-27 VITALS — BP 130/78 | HR 77 | Temp 98.4°F | Resp 18 | Wt 190.0 lb

## 2012-01-27 DIAGNOSIS — I10 Essential (primary) hypertension: Secondary | ICD-10-CM

## 2012-01-27 DIAGNOSIS — I889 Nonspecific lymphadenitis, unspecified: Secondary | ICD-10-CM

## 2012-01-27 NOTE — Patient Instructions (Addendum)
Lymphadenopathy Lymphadenopathy means "disease of the lymph glands." But the term is usually used to describe swollen or enlarged lymph glands, also called lymph nodes. These are the bean-shaped organs found in many locations including the neck, underarm, and groin. Lymph glands are part of the immune system, which fights infections in your body. Lymphadenopathy can occur in just one area of the body, such as the neck, or it can be generalized, with lymph node enlargement in several areas. The nodes found in the neck are the most common sites of lymphadenopathy. CAUSES   When your immune system responds to germs (such as viruses or bacteria ), infection-fighting cells and fluid build up. This causes the glands to grow in size. This is usually not something to worry about. Sometimes, the glands themselves can become infected and inflamed. This is called lymphadenitis. Enlarged lymph nodes can be caused by many diseases:  Bacterial disease, such as strep throat or a skin infection.   Viral disease, such as a common cold.   Other germs, such as lyme disease, tuberculosis, or sexually transmitted diseases.   Cancers, such as lymphoma (cancer of the lymphatic system) or leukemia (cancer of the white blood cells).   Inflammatory diseases such as lupus or rheumatoid arthritis.   Reactions to medications.  Many of the diseases above are rare, but important. This is why you should see your caregiver if you have lymphadenopathy. SYMPTOMS    Swollen, enlarged lumps in the neck, back of the head or other locations.   Tenderness.   Warmth or redness of the skin over the lymph nodes.   Fever.  DIAGNOSIS  Enlarged lymph nodes are often near the source of infection. They can help healthcare providers diagnose your illness. For instance:    Swollen lymph nodes around the jaw might be caused by an infection in the mouth.   Enlarged glands in the neck often signal a throat infection.   Lymph nodes  that are swollen in more than one area often indicate an illness caused by a virus.  Your caregiver most likely will know what is causing your lymphadenopathy after listening to your history and examining you. Blood tests, x-rays or other tests may be needed. If the cause of the enlarged lymph node cannot be found, and it does not go away by itself, then a biopsy may be needed. Your caregiver will discuss this with you. TREATMENT   Treatment for your enlarged lymph nodes will depend on the cause. Many times the nodes will shrink to normal size by themselves, with no treatment. Antibiotics or other medicines may be needed for infection. Only take over-the-counter or prescription medicines for pain, discomfort or fever as directed by your caregiver. HOME CARE INSTRUCTIONS   Swollen lymph glands usually return to normal when the underlying medical condition goes away. If they persist, contact your health-care provider. He/she might prescribe antibiotics or other treatments, depending on the diagnosis. Take any medications exactly as prescribed. Keep any follow-up appointments made to check on the condition of your enlarged nodes.   SEEK MEDICAL CARE IF:    Swelling lasts for more than two weeks.   You have symptoms such as weight loss, night sweats, fatigue or fever that does not go away.   The lymph nodes are hard, seem fixed to the skin or are growing rapidly.   Skin over the lymph nodes is red and inflamed. This could mean there is an infection.  SEEK IMMEDIATE MEDICAL CARE IF:  Fluid starts leaking from the area of the enlarged lymph node.   You develop a fever of 102 F (38.9 C) or greater.   Severe pain develops (not necessarily at the site of a large lymph node).   You develop chest pain or shortness of breath.   You develop worsening abdominal pain.  MAKE SURE YOU:    Understand these instructions.   Will watch your condition.   Will get help right away if you are not doing  well or get worse.  Document Released: 10/17/2007 Document Revised: 04/01/2011 Document Reviewed: 10/17/2007 [facility] Patient Information 2013 [place].     Celebrex 200 mg twice daily

## 2012-01-27 NOTE — Progress Notes (Signed)
Subjective:    Patient ID: Sandra Freeman, female    DOB: [birth year], 52 y.o.   MRN: [id number]  HPI  52 year old patient describes the abrupt onset of swollen glands involving her right facial area while driving yesterday. Otherwise she has felt well she describes some mild nausea yesterday but in general no fever or flulike symptoms. She has treated hypertension which has been stable and a history of allergic rhinitis.  Past Medical History  Diagnosis Date  . ALLERGIC RHINITIS 02/06/2007  . ANEMIA, IRON DEFICIENCY 06/17/2007  . ANXIETY DISORDER 06/02/2007  . ASCUS PAP 04/23/2007  . ASTHMA 02/06/2007  . DEPRESSION 02/06/2007  . DIZZINESS 02/13/2007  . Essential hypertension, benign 04/16/2007  . FATTY LIVER DISEASE 06/17/2007  . Gastroparesis 10/21/2008  . GERD 02/06/2007  . HYPERTENSION 03/18/2008  . HYPOTHYROIDISM 02/06/2007  . LIVER FUNCTION TESTS, ABNORMAL 02/10/2007  . NONSPEC ELEVATION OF LEVELS OF TRANSAMINASE/LDH 06/02/2007  . OBESITY 02/06/2007  . 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 WRIST SPRAIN, RIGHT 05/23/2008    History   Social History  . Marital Status: Divorced    Spouse Name: N/A    Number of Children: N/A  . Years of Education: N/A   Occupational History  . Not on file.   Social History Main Topics  . Smoking status: Never Smoker   . Smokeless tobacco: Never Used  . Alcohol Use: Not on file  . Drug Use: Not on file  . Sexually Active: Not on file   Other Topics Concern  . Not on file   Social History Narrative  . No narrative on file    Past Surgical History  Procedure Date  . Tubal ligation   . Knee surgery   . Cataract extraction   . Back surgery   . Knee arthroscopy     left    Family History  Problem Relation Age of Onset  . Cancer Mother     breast  . Heart disease Mother   . COPD Mother   . Diabetes Father   . Cancer Father     lung  . Diabetes Sister   . Diabetes Brother   . Diabetes Brother     No Known Allergies  Current Outpatient  Prescriptions on File Prior to Visit  Medication Sig Dispense Refill  . albuterol (VENTOLIN HFA) 108 (90 BASE) MCG/ACT inhaler Inhale 2 puffs into the lungs every 4 (four) hours as needed.  1 Inhaler  3  . ALPRAZolam (XANAX) 0.25 MG tablet TAKE ONE TO TWO TABLETS BY MOUTH EVERY DAY AS NEEDED  60 tablet  2  . Biotin 5000 MCG CAPS Take 1 capsule by mouth daily.      [name] Kitchen buPROPion (WELLBUTRIN XL) 300 MG 24 hr tablet Take 300 mg by mouth daily.      . clotrimazole-betamethasone (LOTRISONE) cream APPLY  TWICE DAILY TOPICALLY  30 g  5  . Copper Gluconate (COPPER CAPS) 2 MG CAPS Take 2 mg by mouth daily.      . magnesium gluconate (MAGONATE) 500 MG tablet Take 500 mg by mouth daily.      [name] Kitchen HYDROcodone-acetaminophen (VICODIN) 5-500 MG per tablet Take 1 tablet by mouth every 6 (six) hours as needed for pain.  30 tablet  0    BP 130/78  Pulse 77  Temp 98.4 F (36.9 C) (Oral)  Resp 18  Wt 190 lb (86.183 kg)  SpO2 98%  LMP 01/04/2010       Review of Systems  Constitutional: Negative.  HENT: Positive for facial swelling. Negative for hearing loss, ear pain, congestion, sore throat, rhinorrhea, dental problem, voice change, sinus pressure and tinnitus.   Eyes: Negative for pain, discharge and visual disturbance.  Respiratory: Negative for cough and shortness of breath.   Cardiovascular: Negative for chest pain, palpitations and leg swelling.  Gastrointestinal: Positive for nausea. Negative for vomiting, abdominal pain, diarrhea, constipation, blood in stool and abdominal distention.  Genitourinary: Negative for dysuria, urgency, frequency, hematuria, flank pain, vaginal bleeding, vaginal discharge, difficulty urinating, vaginal pain and pelvic pain.  Musculoskeletal: Negative for joint swelling, arthralgias and gait problem.  Skin: Negative for rash.  Neurological: Negative for dizziness, syncope, speech difficulty, weakness, numbness and headaches.  Hematological: Negative for adenopathy.    Psychiatric/Behavioral: Negative for behavioral problems, dysphoric mood and agitation. The patient is not nervous/anxious.        Objective:   Physical Exam  Constitutional: She is oriented to person, place, and time. She appears well-developed and well-nourished. No distress.  HENT:  Head: Normocephalic.  Right Ear: External ear normal.  Left Ear: External ear normal.  Mouth/Throat: Oropharynx is clear and moist.  Eyes: Conjunctivae normal and EOM are normal. Pupils are equal, round, and reactive to light.  Neck: Normal range of motion. Neck supple. No thyromegaly present.       Patient had some small slightly tender  High  submandibular  lymphadenopathy there is some slight soft tissue swelling in the right preauricular area. Doubt  patient had parotid gland enlargement  Cardiovascular: Normal rate, regular rhythm, normal heart sounds and intact distal pulses.   Pulmonary/Chest: Effort normal and breath sounds normal.  Abdominal: Soft. Bowel sounds are normal. She exhibits no mass. There is no tenderness.  Musculoskeletal: Normal range of motion.  Lymphadenopathy:    She has no cervical adenopathy.  Neurological: She is alert and oriented to person, place, and time.  Skin: Skin is warm and dry. No rash noted.  Psychiatric: She has a normal mood and affect. Her behavior is normal.          Assessment & Plan:   Patient appears to have slightly tender inflammatory high cervical submandibular nodes. Tympanic membranes and oral pharynx clear. Will treat with anti-inflammatory drugs and follow closely clinically. She develops fever erythema will consider antibiotic therapy for possible cellulitis. Hypertension stable

## 2012-01-29 ENCOUNTER — Telehealth: Payer: Self-pay | Admitting: Internal Medicine

## 2012-01-29 MED ORDER — AMOXICILLIN-POT CLAVULANATE 875-125 MG PO TABS: 1.0000 | ORAL_TABLET | Freq: Two times a day (BID) | ORAL | Status: DC

## 2012-01-29 MED ORDER — HYDROCODONE-ACETAMINOPHEN 5-325 MG PO TABS: 1.0000 | ORAL_TABLET | Freq: Four times a day (QID) | ORAL | Status: DC | PRN

## 2012-01-29 NOTE — Telephone Encounter (Signed)
Left message on voicemail.Rx's called into pharmacy.

## 2012-01-29 NOTE — Addendum Note (Signed)
Addended by: Marian Sorrow on: 01/29/2012 12:27 PM   Modules accepted: Orders

## 2012-01-29 NOTE — Telephone Encounter (Signed)
Pt states she was in the office on 01/27/12 for swelling lymph nodes on right side.  Pt states she still has the swelling lymph nodes but now she has a fever (pt not taken temp but feels like she has a fever), ear pain and teeth pain (like when she had a sinus infection). Pt states the right side of her face is warm to touch and it is red.  Pt is requesting an antibiotic and pain medication.  Pt is taking Ibuprofen right now for the pain and it is helping some but not much.  Pt requests medications to be sent to the pharmacy on [facility] [address])

## 2012-01-29 NOTE — Telephone Encounter (Signed)
Augmentin generic 875 #20 one twice a day Vicodin 5  #30 one every 6 hours as needed for pain or fever

## 2012-01-30 NOTE — Telephone Encounter (Signed)
Left message on voicemail. Rx's called into pharmacy yesterday.

## 2012-01-30 NOTE — Telephone Encounter (Signed)
Pt returned your call. She is aware and will pick up rx. Thank you.

## 2012-04-02 ENCOUNTER — Ambulatory Visit (INDEPENDENT_AMBULATORY_CARE_PROVIDER_SITE_OTHER): Payer: 59 | Admitting: Internal Medicine

## 2012-04-02 ENCOUNTER — Encounter: Payer: Self-pay | Admitting: Internal Medicine

## 2012-04-02 ENCOUNTER — Telehealth: Payer: Self-pay | Admitting: Internal Medicine

## 2012-04-02 VITALS — BP 124/70 | Temp 98.3°F | Wt 187.0 lb

## 2012-04-02 DIAGNOSIS — R0789 Other chest pain: Secondary | ICD-10-CM

## 2012-04-02 DIAGNOSIS — R519 Headache, unspecified: Secondary | ICD-10-CM

## 2012-04-02 DIAGNOSIS — R51 Headache: Secondary | ICD-10-CM

## 2012-04-02 DIAGNOSIS — R079 Chest pain, unspecified: Secondary | ICD-10-CM

## 2012-04-02 MED ORDER — ONDANSETRON HCL 4 MG PO TABS: 4.0000 mg | ORAL_TABLET | Freq: Three times a day (TID) | ORAL | Status: DC | PRN

## 2012-04-02 NOTE — Telephone Encounter (Signed)
She had called regarding headache  When I called her back got her voice mail  Left message to call back if she still needed to speak to [place].

## 2012-04-02 NOTE — Telephone Encounter (Signed)
Patient Information:  Caller Name: Eldoris  Phone: [phone]  Patient: Sandra Freeman, Sandra Freeman  Gender: Female  DOB: 05-10-1960  Age: 52 Years  PCP: N/A  Pregnant: No  Office Follow Up:  Does the office need to follow up with this patient?: No  Instructions For The Office: N/A  RN Note:  Headache started about 2 days ago - with OTC helping some but not taking it away.  Woke up today about 04:00 with it very bad pounding making her nauseated with vomiting.  Took a pain pill but vomited it up.  States headache is better now but rates headache as 6-7/10- located from forehead  to top of head. Describes as a throbbing pain.  Triaged with care advice given. Appointment scheduled today with Dr. Shawna Orleans at 16:00.  Her PCP was booked for today  Caller demonstrated her understanding.  Symptoms  Reason For Call & Symptoms: Headache since 04/01/12.  Has taken something which helped but returned at 04:00 this morning with nausea and vomiting.  Reviewed Health History In EMR: Yes  Reviewed Medications In EMR: Yes  Reviewed Allergies In EMR: Yes  Reviewed Surgeries / Procedures: Yes  Date of Onset of Symptoms: 04/01/2012  Treatments Tried: pain relievers helped some  Treatments Tried Worked: Yes OB / GYN:  LMP: Unknown  Guideline(s) Used:  Headache  Disposition Per Guideline:   See Today or Tomorrow in Office  Reason For Disposition Reached:   Unexplained headache that is present > 24 hours  Advice Given:  Rest:   Lie down in a dark, quiet place and try to relax. Close your eyes and imagine your entire body relaxing.  Apply Cold to the Area:   Apply a cold wet washcloth or cold pack to the forehead for 20 minutes.  Stretching:   Stretch and massage any tight neck muscles.  Call Back If:  Headache lasts longer than 24 hours  You become worse.  Pain Medicines:  Acetaminophen (e.g., Tylenol):  Regular Strength Tylenol: Take 650 mg (two 325 mg pills) by mouth every 4-6 hours as needed. Each  Regular Strength Tylenol pill has 325 mg of acetaminophen.  Extra Strength Tylenol: Take 1,000 mg (two 500 mg pills) every 8 hours as needed. Each Extra Strength Tylenol pill has 500 mg of acetaminophen.  The most you should take each day is 3,000 mg (10 Regular Strength or 6 Extra Strength pills a day).  Ibuprofen (e.g., Motrin, Advil):  Take 400 mg (two 200 mg pills) by mouth every 6 hours.  Appointment Scheduled:  04/02/2012 16:00:00 Appointment Scheduled Provider:  Shawna Orleans, [facility] [place]) (Adults only)

## 2012-04-02 NOTE — Patient Instructions (Addendum)
Use ibuprofen 600 mg every 12 hrs as needed Please contact our office if your symptoms do not improve or gets worse.

## 2012-04-02 NOTE — Assessment & Plan Note (Signed)
Patient experiencing intermittent nonexertional chest pain. She reports history of anxiety attacks in the past. Her chest symptoms may be secondary to reactive anxiety from severe headache. EKG is reassuring.

## 2012-04-02 NOTE — Assessment & Plan Note (Addendum)
52 year old white female with new onset severe headache. She does not have any history of migraines. This is "worst headache of her life". Symptoms started 4 AM yesterday. She has associated nausea. Rule out intracranial lesion and also rule out aneurysm. Obtain MRI / MRA of brain.  Use ibuprofen 600 mg bid for now.  Use zofran 4 mg for nausea.  Follow up in 1 week.  Patient advised to call office if symptoms persist or worsen.

## 2012-04-02 NOTE — Progress Notes (Signed)
Subjective:    Patient ID: Sandra Freeman, female    DOB: January 06, 1961, 52 y.o.   MRN: [id number]  HPI  52 year old white female presents with severe new onset headache. Her symptoms started 48 hours ago. She woke up 4 AM with pounding headache. She describes severity as 12 out of 10. Later that day she took a Vicodin. It did not help her headache and she experienced nausea and vomiting. Headache is localized to top of head and behind her eyes. She denies any aggravating or alleviating factors. She's also been somewhat sensitive to light and noise but does not have any history of prior headaches or migraines.  She continues to have nausea. She has had no further episodes of vomiting. Her headache severity is now 7/10.  She also complains of chest tightness and intermittent left upper arm pain. This has occurred over last 3 or 4 days.  Review of Systems Negative for fever or chills, no recent illness.  No unusual stress    Past Medical History  Diagnosis Date  . ALLERGIC RHINITIS 02/06/2007  . ANEMIA, IRON DEFICIENCY 06/17/2007  . ANXIETY DISORDER 06/02/2007  . ASCUS PAP 04/23/2007  . ASTHMA 02/06/2007  . DEPRESSION 02/06/2007  . DIZZINESS 02/13/2007  . Essential hypertension, benign 04/16/2007  . FATTY LIVER DISEASE 06/17/2007  . Gastroparesis 10/21/2008  . GERD 02/06/2007  . HYPERTENSION 03/18/2008  . HYPOTHYROIDISM 02/06/2007  . LIVER FUNCTION TESTS, ABNORMAL 02/10/2007  . NONSPEC ELEVATION OF LEVELS OF TRANSAMINASE/LDH 06/02/2007  . OBESITY 02/06/2007  . 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 WRIST SPRAIN, RIGHT 05/23/2008    History   Social History  . Marital Status: Divorced    Spouse Name: N/A    Number of Children: N/A  . Years of Education: N/A   Occupational History  . Not on file.   Social History Main Topics  . Smoking status: Never Smoker   . Smokeless tobacco: Never Used  . Alcohol Use: Not on file  . Drug Use: Not on file  . Sexually Active: Not on file   Other Topics Concern   . Not on file   Social History Narrative  . No narrative on file    Past Surgical History  Procedure Laterality Date  . Tubal ligation    . Knee surgery    . Cataract extraction    . Back surgery    . Knee arthroscopy      left    Family History  Problem Relation Age of Onset  . Cancer Mother     breast  . Heart disease Mother   . COPD Mother   . Diabetes Father   . Cancer Father     lung  . Diabetes Sister   . Diabetes Brother   . Diabetes Brother     No Known Allergies  Current Outpatient Prescriptions on File Prior to Visit  Medication Sig Dispense Refill  . albuterol (VENTOLIN HFA) 108 (90 BASE) MCG/ACT inhaler Inhale 2 puffs into the lungs every 4 (four) hours as needed.  1 Inhaler  3  . ALPRAZolam (XANAX) 0.25 MG tablet TAKE ONE TO TWO TABLETS BY MOUTH EVERY DAY AS NEEDED  60 tablet  2  . amoxicillin-clavulanate (AUGMENTIN) 875-125 MG per tablet Take 1 tablet by mouth 2 (two) times daily.  20 tablet  0  . Biotin 5000 MCG CAPS Take 1 capsule by mouth daily.      [name] Kitchen buPROPion (WELLBUTRIN XL) 300 MG 24 hr tablet Take 300 mg by  mouth daily.      . clotrimazole-betamethasone (LOTRISONE) cream APPLY  TWICE DAILY TOPICALLY  30 g  5  . Copper Gluconate (COPPER CAPS) 2 MG CAPS Take 2 mg by mouth daily.      [name] Kitchen HYDROcodone-acetaminophen (NORCO/VICODIN) 5-325 MG per tablet Take 1 tablet by mouth every 6 (six) hours as needed for pain.  30 tablet  0  . HYDROcodone-acetaminophen (VICODIN) 5-500 MG per tablet Take 1 tablet by mouth every 6 (six) hours as needed for pain.  30 tablet  0  . magnesium gluconate (MAGONATE) 500 MG tablet Take 500 mg by mouth daily.       No current facility-administered medications on file prior to visit.    BP 124/70  Temp(Src) 98.3 F (36.8 C) (Oral)  Wt 187 lb (84.823 kg)  BMI 34.77 kg/m2  LMP 01/04/2010  EKG shows normal sinus rhythm at 63 beats per minute. No ST changes.   Objective:   Physical Exam  Constitutional: She is oriented  to person, place, and time. She appears well-developed and well-nourished.  HENT:  Head: Normocephalic and atraumatic.  Right Ear: External ear normal.  Left cerumen impaction  Eyes: EOM are normal. Pupils are equal, round, and reactive to light.  Bilateral iridectomy  Neck: Neck supple.  Negative for neck pain or stiffness  Cardiovascular: Normal rate, regular rhythm and normal heart sounds.   Pulmonary/Chest: Effort normal and breath sounds normal. She has no wheezes.  Lymphadenopathy:    She has no cervical adenopathy.  Neurological: She is alert and oriented to person, place, and time. She displays normal reflexes. No cranial nerve deficit. She exhibits normal muscle tone. Coordination normal.  Psychiatric: She has a normal mood and affect. Her behavior is normal.          Assessment & Plan:

## 2012-04-03 ENCOUNTER — Ambulatory Visit
Admission: RE | Admit: 2012-04-03 | Discharge: 2012-04-03 | Disposition: A | Discharge status: Home / Self Care | Payer: 59 | Source: Ambulatory Visit | Attending: Internal Medicine | Admitting: Internal Medicine

## 2012-04-03 ENCOUNTER — Ambulatory Visit: Payer: 59 | Admitting: Internal Medicine

## 2012-04-03 DIAGNOSIS — R519 Headache, unspecified: Secondary | ICD-10-CM

## 2012-04-21 ENCOUNTER — Other Ambulatory Visit: Payer: Self-pay | Admitting: Internal Medicine

## 2012-09-01 ENCOUNTER — Ambulatory Visit: Payer: 59 | Admitting: Internal Medicine

## 2012-09-01 ENCOUNTER — Telehealth: Payer: Self-pay | Admitting: Internal Medicine

## 2012-09-01 NOTE — Telephone Encounter (Signed)
Spoke to the pt.  She is doing better.  Does not feel 100% but is better.  She believes this to be a panic attack but described it as "different"  She has not taken the Xanax since she is doing better.  Currently in her recliner at home resting per CAN instructions.  Pt rescheduled her appt for Thursday morning at 11am.  Will call if sx worsen or will go to ED.

## 2012-09-01 NOTE — Telephone Encounter (Signed)
FYI

## 2012-09-01 NOTE — Telephone Encounter (Signed)
Noted  

## 2012-09-01 NOTE — Telephone Encounter (Signed)
Patient Information:  Caller Name: Tamiko  Phone: [phone]  Patient: Sandra Freeman, Sandra Freeman  Gender: Female  DOB: 23-Jan-1960  Age: 52 Years  PCP: Bluford Kaufmann [facility])  Pregnant: No  Office Follow Up:  Does the office need to follow up with this patient?: Yes  Instructions For The Office: Please follow up with patient today.  Patient had appointment but cancelled it for today-yet still calls for triage.  Severity of symptoms described last night is concerning although to day episodes have progressively ended and were much milder at this point.  RN Note:  Dizziness during the night was severe with bounding pulse that felt rapid.  Today, feels more like a panic attack, but with on and off dizziness and strong heart beat.  Is hydrated but dizziness is making her nauseated at times.  Checked blood pressure at local stores noting it as 154/82 and 144/81 with pulse at 86.  Not sure if panic attack or not.  Triaged with care advice given.  RN is concerned with the severity described and continuance even though it has significantly decreased at this point.  Currently denies symptoms and wants to go home and try a Xanax yet still called for triage.  Patient had an appointment at 16:00 today 08/12 with Dr. Raliegh Ip, but cancelled.  She can't really tell RN why.  RN is sending a note to office for a follow up call and work in today if possible based on patient's history, symptoms etc.  Symptoms  Reason For Call & Symptoms: Has not been resting well for last several nights.  Awoke during the night and felt that room dropped and felt dizzy.  Rested for about 45 minutes, but got up to go to the bathroom with onset of a severe episode.  Has had dizziness  off and on all day.  Reviewed Health History In EMR: Yes  Reviewed Medications In EMR: Yes  Reviewed Allergies In EMR: Yes  Reviewed Surgeries / Procedures: Yes  Date of Onset of Symptoms: 08/31/2012 OB / GYN:  LMP: Unknown  Guideline(s) Used:  Dizziness  Disposition Per Guideline:   See Today in Office  Reason For Disposition Reached:   Patient wants to be seen  Advice Given:  Some Causes of Temporary Dizziness:  Poor Fluid Intake - Not drinking enough fluids and being a little dehydrated is a common cause of temporary dizziness. This is always worse during hot weather.  Standing Up Suddenly - Standing up suddenly (especially getting out of bed) or prolonged standing in one place are common causes of temporary dizziness. Not drinking enough fluids always makes it worse. Certain medications can cause or increase this type of dizziness (e.g., blood pressure medications).  Heat Exposure - Hot weather, hot tubs, or too much sun exposure are common causes of temporary dizziness. Not drinking enough fluids always makes it worse.  Drink Fluids:  Drink several glasses of fruit juice, other clear fluids, or water. This will improve hydration and blood glucose. If you have a fever or have had heat exposure, make sure the fluids are cold.  Cool Off:  If the weather is hot, apply a cold compress to the forehead or take a cool shower or bath.  Rest for 1-2 Hours:  Lie down with feet elevated for 1 hour. This will improve blood flow and increase blood flow to the brain.  Call Back If:  Still feel dizzy after 2 hours of rest and fluids  Passes out (faints)  You become  worse.  RN Overrode Recommendation:  Make Appointment  Unable to make an appointment - forwarding a note to office staff.

## 2012-09-03 ENCOUNTER — Ambulatory Visit (INDEPENDENT_AMBULATORY_CARE_PROVIDER_SITE_OTHER): Payer: 59 | Admitting: Internal Medicine

## 2012-09-03 ENCOUNTER — Encounter: Payer: Self-pay | Admitting: Internal Medicine

## 2012-09-03 VITALS — BP 140/90 | HR 70 | Temp 97.9°F | Resp 20 | Wt 190.0 lb

## 2012-09-03 DIAGNOSIS — J309 Allergic rhinitis, unspecified: Secondary | ICD-10-CM

## 2012-09-03 DIAGNOSIS — R42 Dizziness and giddiness: Secondary | ICD-10-CM

## 2012-09-03 DIAGNOSIS — H8110 Benign paroxysmal vertigo, unspecified ear: Secondary | ICD-10-CM

## 2012-09-03 DIAGNOSIS — I10 Essential (primary) hypertension: Secondary | ICD-10-CM

## 2012-09-03 MED ORDER — MECLIZINE HCL 25 MG PO TABS: 25.0000 mg | ORAL_TABLET | Freq: Three times a day (TID) | ORAL | Status: DC | PRN

## 2012-09-03 NOTE — Patient Instructions (Addendum)
Benign Positional Vertigo Vertigo means you feel like you or your surroundings are moving when they are not. Benign positional vertigo is the most common form of vertigo. Benign means that the cause of your condition is not serious. Benign positional vertigo is more common in older adults. CAUSES  Benign positional vertigo is the result of an upset in the labyrinth system. This is an area in the middle ear that helps control your balance. This may be caused by a viral infection, head injury, or repetitive motion. However, often no specific cause is found. SYMPTOMS  Symptoms of benign positional vertigo occur when you move your head or eyes in different directions. Some of the symptoms may include:  Loss of balance and falls.  Vomiting.  Blurred vision.  Dizziness.  Nausea.  Involuntary eye movements (nystagmus). DIAGNOSIS  Benign positional vertigo is usually diagnosed by physical exam. If the specific cause of your benign positional vertigo is unknown, your caregiver may perform imaging tests, such as magnetic resonance imaging (MRI) or computed tomography (CT). TREATMENT  Your caregiver may recommend movements or procedures to correct the benign positional vertigo. Medicines such as meclizine, benzodiazepines, and medicines for nausea may be used to treat your symptoms. In rare cases, if your symptoms are caused by certain conditions that affect the inner ear, you may need surgery. HOME CARE INSTRUCTIONS   Follow your caregiver's instructions.  Move slowly. Do not make sudden body or head movements.  Avoid driving.  Avoid operating heavy machinery.  Avoid performing any tasks that would be dangerous to you or others during a vertigo episode.  Drink enough fluids to keep your urine clear or pale yellow. SEEK IMMEDIATE MEDICAL CARE IF:   You develop problems with walking, weakness, numbness, or using your arms, hands, or legs.  You have difficulty speaking.  You develop  severe headaches.  Your nausea or vomiting continues or gets worse.  You develop visual changes.  Your family or friends notice any behavioral changes.  Your condition gets worse.  You have a fever.  You develop a stiff neck or sensitivity to light. MAKE SURE YOU:   Understand these instructions.  Will watch your condition.  Will get help right away if you are not doing well or get worse. Document Released: 10/15/2005 Document Revised: 04/01/2011 Document Reviewed: 09/27/2010 [facility] Patient Information 2014 [place].

## 2012-09-03 NOTE — Progress Notes (Signed)
Subjective:    Patient ID: Sandra Freeman, female    DOB: 01-29-1960, 52 y.o.   MRN: [id number]  HPI  52 year old patient who has a history of treated hypertension allergic rhinitis and history of dizziness in the past. 2 days ago she awoke with significant vertigo which has improved. She still has a mild sense of disequilibrium and mild headache but no severe vertiginous symptoms. She has had some mild associated nausea.  BP Readings from Last 3 Encounters:  09/03/12 140/90  04/02/12 124/70  01/27/12 130/78   Past Medical History  Diagnosis Date  . ALLERGIC RHINITIS 02/06/2007  . ANEMIA, IRON DEFICIENCY 06/17/2007  . ANXIETY DISORDER 06/02/2007  . ASCUS PAP 04/23/2007  . ASTHMA 02/06/2007  . DEPRESSION 02/06/2007  . DIZZINESS 02/13/2007  . Essential hypertension, benign 04/16/2007  . FATTY LIVER DISEASE 06/17/2007  . Gastroparesis 10/21/2008  . GERD 02/06/2007  . HYPERTENSION 03/18/2008  . HYPOTHYROIDISM 02/06/2007  . LIVER FUNCTION TESTS, ABNORMAL 02/10/2007  . NONSPEC ELEVATION OF LEVELS OF TRANSAMINASE/LDH 06/02/2007  . OBESITY 02/06/2007  . 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 WRIST SPRAIN, RIGHT 05/23/2008    History   Social History  . Marital Status: Divorced    Spouse Name: N/A    Number of Children: N/A  . Years of Education: N/A   Occupational History  . Not on file.   Social History Main Topics  . Smoking status: Never Smoker   . Smokeless tobacco: Never Used  . Alcohol Use: Not on file  . Drug Use: Not on file  . Sexual Activity: Not on file   Other Topics Concern  . Not on file   Social History Narrative  . No narrative on file    Past Surgical History  Procedure Laterality Date  . Tubal ligation    . Knee surgery    . Cataract extraction    . Back surgery    . Knee arthroscopy      left    Family History  Problem Relation Age of Onset  . Cancer Mother     breast  . Heart disease Mother   . COPD Mother   . Diabetes Father   . Cancer Father     lung  .  Diabetes Sister   . Diabetes Brother   . Diabetes Brother     No Known Allergies  Current Outpatient Prescriptions on File Prior to Visit  Medication Sig Dispense Refill  . albuterol (VENTOLIN HFA) 108 (90 BASE) MCG/ACT inhaler Inhale 2 puffs into the lungs every 4 (four) hours as needed.  1 Inhaler  3  . ALPRAZolam (XANAX) 0.25 MG tablet TAKE ONE TO TWO TABLETS BY MOUTH EVERY DAY AS NEEDED  60 tablet  2  . clotrimazole-betamethasone (LOTRISONE) cream APPLY  TWICE DAILY TOPICALLY  30 g  5  . buPROPion (WELLBUTRIN XL) 300 MG 24 hr tablet TAKE ONE TABLET BY MOUTH IN THE MORNING  90 tablet  0   No current facility-administered medications on file prior to visit.    BP 140/90  Pulse 70  Temp(Src) 97.9 F (36.6 C) (Oral)  Resp 20  Wt 190 lb (86.183 kg)  BMI 35.32 kg/m2  SpO2 98%  LMP 01/04/2010    Review of Systems  Constitutional: Negative.   HENT: Negative for hearing loss, congestion, sore throat, rhinorrhea, dental problem, sinus pressure and tinnitus.   Eyes: Negative for pain, discharge and visual disturbance.  Respiratory: Negative for cough and shortness of breath.   Cardiovascular:  Negative for chest pain, palpitations and leg swelling.  Gastrointestinal: Negative for nausea, vomiting, abdominal pain, diarrhea, constipation, blood in stool and abdominal distention.  Genitourinary: Negative for dysuria, urgency, frequency, hematuria, flank pain, vaginal bleeding, vaginal discharge, difficulty urinating, vaginal pain and pelvic pain.  Musculoskeletal: Negative for joint swelling, arthralgias and gait problem.  Skin: Negative for rash.  Neurological: Positive for light-headedness. Negative for dizziness, syncope, speech difficulty, weakness, numbness and headaches.  Hematological: Negative for adenopathy.  Psychiatric/Behavioral: Negative for behavioral problems, dysphoric mood and agitation. The patient is not nervous/anxious.        Objective:   Physical Exam   Constitutional: She is oriented to person, place, and time. She appears well-developed and well-nourished.  HENT:  Head: Normocephalic.  Right Ear: External ear normal.  Left Ear: External ear normal.  Mouth/Throat: Oropharynx is clear and moist.  Eyes: Conjunctivae and EOM are normal. Pupils are equal, round, and reactive to light.  Neck: Normal range of motion. Neck supple. No thyromegaly present.  Cardiovascular: Normal rate, regular rhythm, normal heart sounds and intact distal pulses.   Pulmonary/Chest: Effort normal and breath sounds normal.  Abdominal: Soft. Bowel sounds are normal. She exhibits no mass. There is no tenderness.  Musculoskeletal: Normal range of motion.  Lymphadenopathy:    She has no cervical adenopathy.  Neurological: She is alert and oriented to person, place, and time. No cranial nerve deficit. Coordination normal.  Skin: Skin is warm and dry. No rash noted.  Psychiatric: She has a normal mood and affect. Her behavior is normal.          Assessment & Plan:   Benign paroxysmal vertigo. Will treat with meclizine. Information concerning repositioning maneuvers dispensed. Will call if unimproved. Hypertension stable

## 2012-10-06 ENCOUNTER — Other Ambulatory Visit: Payer: Self-pay | Admitting: Internal Medicine

## 2012-10-08 ENCOUNTER — Other Ambulatory Visit: Payer: Self-pay | Admitting: *Deleted

## 2012-10-08 MED ORDER — BUPROPION HCL ER (XL) 300 MG PO TB24: ORAL_TABLET | ORAL | Status: DC

## 2012-11-26 ENCOUNTER — Other Ambulatory Visit: Payer: Self-pay

## 2013-03-16 ENCOUNTER — Emergency Department (HOSPITAL_COMMUNITY): Payer: PRIVATE HEALTH INSURANCE

## 2013-03-16 ENCOUNTER — Emergency Department (HOSPITAL_COMMUNITY)
Admission: EM | Admit: 2013-03-16 | Discharge: 2013-03-16 | Disposition: A | Discharge status: Home / Self Care | Payer: PRIVATE HEALTH INSURANCE | Attending: Emergency Medicine | Admitting: Emergency Medicine

## 2013-03-16 ENCOUNTER — Encounter (HOSPITAL_COMMUNITY): Payer: Self-pay | Admitting: Emergency Medicine

## 2013-03-16 DIAGNOSIS — R519 Headache, unspecified: Secondary | ICD-10-CM

## 2013-03-16 DIAGNOSIS — Z862 Personal history of diseases of the blood and blood-forming organs and certain disorders involving the immune mechanism: Secondary | ICD-10-CM | POA: Insufficient documentation

## 2013-03-16 DIAGNOSIS — S0990XA Unspecified injury of head, initial encounter: Secondary | ICD-10-CM | POA: Insufficient documentation

## 2013-03-16 DIAGNOSIS — M545 Low back pain, unspecified: Secondary | ICD-10-CM

## 2013-03-16 DIAGNOSIS — Z79899 Other long term (current) drug therapy: Secondary | ICD-10-CM | POA: Insufficient documentation

## 2013-03-16 DIAGNOSIS — I10 Essential (primary) hypertension: Secondary | ICD-10-CM | POA: Insufficient documentation

## 2013-03-16 DIAGNOSIS — R51 Headache: Secondary | ICD-10-CM

## 2013-03-16 DIAGNOSIS — J45909 Unspecified asthma, uncomplicated: Secondary | ICD-10-CM | POA: Insufficient documentation

## 2013-03-16 DIAGNOSIS — Z8719 Personal history of other diseases of the digestive system: Secondary | ICD-10-CM | POA: Insufficient documentation

## 2013-03-16 DIAGNOSIS — E669 Obesity, unspecified: Secondary | ICD-10-CM | POA: Insufficient documentation

## 2013-03-16 DIAGNOSIS — Z872 Personal history of diseases of the skin and subcutaneous tissue: Secondary | ICD-10-CM | POA: Insufficient documentation

## 2013-03-16 DIAGNOSIS — Z8659 Personal history of other mental and behavioral disorders: Secondary | ICD-10-CM | POA: Insufficient documentation

## 2013-03-16 DIAGNOSIS — Y9389 Activity, other specified: Secondary | ICD-10-CM | POA: Insufficient documentation

## 2013-03-16 DIAGNOSIS — Y92410 Unspecified street and highway as the place of occurrence of the external cause: Secondary | ICD-10-CM | POA: Insufficient documentation

## 2013-03-16 MED ORDER — NAPROXEN 500 MG PO TABS: 500.0000 mg | ORAL_TABLET | Freq: Two times a day (BID) | ORAL | Status: DC

## 2013-03-16 MED ORDER — KETOROLAC TROMETHAMINE 60 MG/2ML IM SOLN
60.0000 mg | Freq: Once | INTRAMUSCULAR | Status: AC
  Administered 2013-03-16: 60 mg via INTRAMUSCULAR
  Filled 2013-03-16: qty 2

## 2013-03-16 MED ORDER — METHOCARBAMOL 500 MG PO TABS: 500.0000 mg | ORAL_TABLET | Freq: Two times a day (BID) | ORAL | Status: DC
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
## 2013-03-16 NOTE — Discharge Instructions (Signed)
Take medications as directed. Follow up with your doctor in 1 week. Return to ED should you develop worsening Headache, Nausea, Vomiting, numbness/tingling, weakness or difficulty urinating.

## 2013-03-19 NOTE — ED Provider Notes (Signed)
See prior note   Janice Norrie, MD 03/19/13 1311

## 2013-03-25 ENCOUNTER — Encounter: Payer: Self-pay | Admitting: Family Medicine

## 2013-03-25 ENCOUNTER — Ambulatory Visit (INDEPENDENT_AMBULATORY_CARE_PROVIDER_SITE_OTHER): Payer: Self-pay | Admitting: Family Medicine

## 2013-03-25 VITALS — BP 124/80 | HR 57 | Ht 61.5 in | Wt 188.0 lb

## 2013-03-25 DIAGNOSIS — M545 Low back pain, unspecified: Secondary | ICD-10-CM

## 2013-03-25 MED ORDER — HYDROCODONE-ACETAMINOPHEN 5-325 MG PO TABS: 1.0000 | ORAL_TABLET | Freq: Four times a day (QID) | ORAL | Status: DC | PRN

## 2013-03-25 NOTE — Progress Notes (Signed)
Pre visit review using our clinic review tool, if applicable. No additional management support is needed unless otherwise documented below in the visit note. 
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
## 2013-03-30 ENCOUNTER — Ambulatory Visit: Payer: PRIVATE HEALTH INSURANCE | Attending: Family Medicine | Admitting: Physical Therapy

## 2013-03-30 DIAGNOSIS — M545 Low back pain, unspecified: Secondary | ICD-10-CM | POA: Insufficient documentation

## 2013-03-30 DIAGNOSIS — IMO0001 Reserved for inherently not codable concepts without codable children: Secondary | ICD-10-CM | POA: Insufficient documentation

## 2013-03-30 DIAGNOSIS — M6281 Muscle weakness (generalized): Secondary | ICD-10-CM | POA: Insufficient documentation

## 2013-03-30 DIAGNOSIS — M546 Pain in thoracic spine: Secondary | ICD-10-CM | POA: Insufficient documentation

## 2013-03-30 DIAGNOSIS — R5381 Other malaise: Secondary | ICD-10-CM | POA: Insufficient documentation

## 2013-04-05 ENCOUNTER — Ambulatory Visit: Payer: PRIVATE HEALTH INSURANCE | Admitting: Rehabilitation

## 2013-04-07 ENCOUNTER — Encounter: Payer: Self-pay | Admitting: Rehabilitation

## 2013-04-14 ENCOUNTER — Ambulatory Visit: Payer: No Typology Code available for payment source | Admitting: Physical Therapy

## 2013-04-15 ENCOUNTER — Encounter: Payer: Self-pay | Admitting: Rehabilitation

## 2013-04-17 ENCOUNTER — Other Ambulatory Visit: Payer: Self-pay | Admitting: Internal Medicine

## 2013-04-19 ENCOUNTER — Other Ambulatory Visit: Payer: Self-pay | Admitting: Internal Medicine

## 2013-04-20 ENCOUNTER — Encounter: Payer: Self-pay | Admitting: Rehabilitation

## 2013-04-22 ENCOUNTER — Encounter: Payer: Self-pay | Admitting: Rehabilitation

## 2013-05-18 ENCOUNTER — Other Ambulatory Visit: Payer: Self-pay | Admitting: Internal Medicine

## 2013-07-15 ENCOUNTER — Other Ambulatory Visit: Payer: Self-pay | Admitting: Internal Medicine

## 2013-10-12 ENCOUNTER — Telehealth: Payer: Self-pay | Admitting: Internal Medicine

## 2013-10-12 NOTE — Telephone Encounter (Signed)
Patient Information:  Caller Name: [name]  Phone: [phone]  Patient: Sandra Freeman, Sandra Freeman  Gender: Female  DOB: 07/31/1960  Age: 53 Years  PCP: Bluford Kaufmann (Family Practice > 70yr old)  Pregnant: No  Office Follow Up:  Does the office need to follow up with this patient?: No  Instructions For The Office: N/A  RN Note:  Pt already had an appointment scheduled for 10/13/2013 at 08:00 with Dr. KBurnice Logan  Symptoms  Reason For Call & Symptoms: Onset 1 week ago of fever, then fever blister on lip and 4 boil-type cyst on abdomen at umbilicus.  One is approximately the size of a quarter and intermittently painful to touch (rates 4?/10).  Reviewed Health History In EMR: Yes  Reviewed Medications In EMR: Yes  Reviewed Allergies In EMR: Yes  Reviewed Surgeries / Procedures: Yes  Date of Onset of Symptoms: 10/05/2013  Treatments Tried: Epsom salts and witch hazel daily with bath Neosporin with bandage change TID Ibuprofen 400 mg at 06:30  Treatments Tried Worked: No  Any Fever: Yes  Fever Taken: Tactile  Fever Time Of Reading: 12:05:00  Fever Last Reading: N/A OB / GYN:  LMP: Unknown  Guideline(s) Used:  Skin Lesion - Moles or Growths  Disposition Per Guideline:   Go to Office Now  Reason For Disposition Reached:   Fever and bump is tender to touch  Advice Given:  Monthly Skin Self-examination:  Some doctors recommend that you perform a skin self-examination once a month.  Stand in front of a mirror. Examine every inch of your skin for new moles or changes in old ones.  Skin Health:  Avoid sun exposure. Use sunscreen or wear protective clothing: long-sleeve shirts.  Stay in the shade during the middle of the day. Wear a wide-brimmed hat to keep the sun off your face and neck.  Do not go to tanning salons. Tanning booths also cause skin damage.  Patient Refused Recommendation:  Patient Refused Appt, Patient Requests Appt At Later Date  Has appointment for 10/13/2013  at 8:00 due to work.

## 2013-10-12 NOTE — Telephone Encounter (Signed)
noted 

## 2013-10-13 ENCOUNTER — Ambulatory Visit (INDEPENDENT_AMBULATORY_CARE_PROVIDER_SITE_OTHER): Payer: PRIVATE HEALTH INSURANCE | Admitting: Internal Medicine

## 2013-10-13 ENCOUNTER — Encounter: Payer: Self-pay | Admitting: Internal Medicine

## 2013-10-13 VITALS — BP 150/90 | HR 90 | Temp 98.9°F | Resp 20 | Ht 61.5 in | Wt 180.0 lb

## 2013-10-13 DIAGNOSIS — L723 Sebaceous cyst: Secondary | ICD-10-CM

## 2013-10-13 DIAGNOSIS — E039 Hypothyroidism, unspecified: Secondary | ICD-10-CM

## 2013-10-13 DIAGNOSIS — L089 Local infection of the skin and subcutaneous tissue, unspecified: Secondary | ICD-10-CM

## 2013-10-13 DIAGNOSIS — I10 Essential (primary) hypertension: Secondary | ICD-10-CM

## 2013-10-13 MED ORDER — AMOXICILLIN-POT CLAVULANATE 875-125 MG PO TABS: 1.0000 | ORAL_TABLET | Freq: Two times a day (BID) | ORAL | Status: DC

## 2013-10-13 NOTE — Progress Notes (Signed)
Pre visit review using our clinic review tool, if applicable. No additional management support is needed unless otherwise documented below in the visit note. 

## 2013-10-13 NOTE — Progress Notes (Signed)
Subjective:    Patient ID: Sandra Freeman, female    DOB: [birth year], 53 y.o.   MRN: [id number]  HPI 53 year old patient, who presents complaining of a painful nodule involving the right mid abdominal wall.  This has become more red, painful, and has been draining spontaneously.  She has a history of hypertension in the past.  Presently untreated.  She has mild asthma and a history of anxiety disorder.  Past Medical History  Diagnosis Date  . ALLERGIC RHINITIS 02/06/2007  . ANEMIA, IRON DEFICIENCY 06/17/2007  . ANXIETY DISORDER 06/02/2007  . ASCUS PAP 04/23/2007  . ASTHMA 02/06/2007  . DEPRESSION 02/06/2007  . DIZZINESS 02/13/2007  . Essential hypertension, benign 04/16/2007  . FATTY LIVER DISEASE 06/17/2007  . Gastroparesis 10/21/2008  . GERD 02/06/2007  . HYPERTENSION 03/18/2008  . HYPOTHYROIDISM 02/06/2007  . LIVER FUNCTION TESTS, ABNORMAL 02/10/2007  . NONSPEC ELEVATION OF LEVELS OF TRANSAMINASE/LDH 06/02/2007  . OBESITY 02/06/2007  . 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 WRIST SPRAIN, RIGHT 05/23/2008    History   Social History  . Marital Status: Divorced    Spouse Name: N/A    Number of Children: N/A  . Years of Education: N/A   Occupational History  . Not on file.   Social History Main Topics  . Smoking status: Never Smoker   . Smokeless tobacco: Never Used  . Alcohol Use: Yes     Comment: social   . Drug Use: No  . Sexual Activity: Not on file   Other Topics Concern  . Not on file   Social History Narrative  . No narrative on file    Past Surgical History  Procedure Laterality Date  . Tubal ligation    . Knee surgery    . Cataract extraction    . Posterior cervical laminectomy    . Knee arthroscopy      left  . Anterior fusion cervical spine      Family History  Problem Relation Age of Onset  . Cancer Mother     breast  . Heart disease Mother   . COPD Mother   . Diabetes Father   . Cancer Father     lung  . Diabetes Sister   . Diabetes Brother   .  Diabetes Brother     No Known Allergies  Current Outpatient Prescriptions on File Prior to Visit  Medication Sig Dispense Refill  . albuterol (PROVENTIL HFA;VENTOLIN HFA) 108 (90 BASE) MCG/ACT inhaler Inhale 2 puffs into the lungs every 4 (four) hours as needed (wheezing).      . ALPRAZolam (XANAX) 0.25 MG tablet TAKE ONE TO TWO TABLETS BY MOUTH ONCE DAILY AS NEEDED  60 tablet  0  . clotrimazole-betamethasone (LOTRISONE) cream APPLY  TWICE DAILY TOPICALLY  30 g  2  . ibuprofen (ADVIL,MOTRIN) 200 MG tablet Take 400 mg by mouth every 6 (six) hours as needed (pain).       No current facility-administered medications on file prior to visit.    BP 150/90  Pulse 90  Temp(Src) 98.9 F (37.2 C) (Oral)  Resp 20  Ht 5' 1.5" (1.562 m)  Wt 180 lb (81.647 kg)  BMI 33.46 kg/m2  SpO2 98%  LMP 01/04/2010      Review of Systems  Constitutional: Negative.   HENT: Negative for congestion, dental problem, hearing loss, rhinorrhea, sinus pressure, sore throat and tinnitus.   Eyes: Negative for pain, discharge and visual disturbance.  Respiratory: Negative for cough and shortness of breath.  Cardiovascular: Negative for chest pain, palpitations and leg swelling.  Gastrointestinal: Negative for nausea, vomiting, abdominal pain, diarrhea, constipation, blood in stool and abdominal distention.  Genitourinary: Negative for dysuria, urgency, frequency, hematuria, flank pain, vaginal bleeding, vaginal discharge, difficulty urinating, vaginal pain and pelvic pain.  Musculoskeletal: Negative for arthralgias, gait problem and joint swelling.  Skin: Positive for wound. Negative for rash.  Neurological: Negative for dizziness, syncope, speech difficulty, weakness, numbness and headaches.  Hematological: Negative for adenopathy.  Psychiatric/Behavioral: Negative for behavioral problems, dysphoric mood and agitation. The patient is not nervous/anxious.        Objective:   Physical Exam  Constitutional:  She appears well-developed and well-nourished. No distress.  Repeat blood pressure 130/80  Skin:  2 x 3 cm, erythematous nodule, right mid abdominal wall with spontaneous drainage of exudate          Assessment & Plan:   Furuncle , right mid abdominal wall.  I&D was suggested, but patient declines;  she wishes to try antibiotic therapy initially.  Since the wound is improving and spontaneously draining, will place on Augmentin and observe.  Patient is aware that this likely will require I&D if no improvement

## 2013-10-13 NOTE — Patient Instructions (Signed)
Call or return to clinic prn if these symptoms worsen or fail to improve as anticipated. Epidermal Cyst An epidermal cyst is sometimes called a sebaceous cyst, epidermal inclusion cyst, or infundibular cyst. These cysts usually contain a substance that looks "pasty" or "cheesy" and may have a bad smell. This substance is a protein called keratin. Epidermal cysts are usually found on the face, neck, or trunk. They may also occur in the vaginal area or other parts of the genitalia of both men and women. Epidermal cysts are usually small, painless, slow-growing bumps or lumps that move freely under the skin. It is important not to try to pop them. This may cause an infection and lead to tenderness and swelling. CAUSES  Epidermal cysts may be caused by a deep penetrating injury to the skin or a plugged hair follicle, often associated with acne. SYMPTOMS  Epidermal cysts can become inflamed and cause:  Redness.  Tenderness.  Increased temperature of the skin over the bumps or lumps.  Grayish-white, bad smelling material that drains from the bump or lump. DIAGNOSIS  Epidermal cysts are easily diagnosed by your caregiver during an exam. Rarely, a tissue sample (biopsy) may be taken to rule out other conditions that may resemble epidermal cysts. TREATMENT   Epidermal cysts often get better and disappear on their own. They are rarely ever cancerous.  If a cyst becomes infected, it may become inflamed and tender. This may require opening and draining the cyst. Treatment with antibiotics may be necessary. When the infection is gone, the cyst may be removed with minor surgery.  Small, inflamed cysts can often be treated with antibiotics or by injecting steroid medicines.  Sometimes, epidermal cysts become large and bothersome. If this happens, surgical removal in your caregiver's office may be necessary. HOME CARE INSTRUCTIONS  Only take over-the-counter or prescription medicines as directed by your  caregiver.  Take your antibiotics as directed. Finish them even if you start to feel better. SEEK MEDICAL CARE IF:   Your cyst becomes tender, red, or swollen.  Your condition is not improving or is getting worse.  You have any other questions or concerns. MAKE SURE YOU:  Understand these instructions.  Will watch your condition.  Will get help right away if you are not doing well or get worse. Document Released: 12/09/2003 Document Revised: 04/01/2011 Document Reviewed: 07/16/2010 [employer] Patient Information 2015 [place], [place]. This information is not intended to replace advice given to you by your health care provider. Make sure you discuss any questions you have with your health care provider.

## 2013-10-18 ENCOUNTER — Other Ambulatory Visit: Payer: Self-pay | Admitting: Internal Medicine

## 2013-11-23 ENCOUNTER — Telehealth: Payer: Self-pay | Admitting: Internal Medicine

## 2013-11-23 NOTE — Telephone Encounter (Signed)
[facility] [place], [place] - [address] Is requesting re-fill on ALPRAZolam (XANAX) 0.25 MG tablet

## 2013-11-24 MED ORDER — ALPRAZOLAM 0.25 MG PO TABS: ORAL_TABLET | ORAL | Status: DC

## 2013-11-24 NOTE — Telephone Encounter (Signed)
Rx called in to pharmacy. 

## 2014-06-15 ENCOUNTER — Telehealth: Payer: Self-pay | Admitting: Family Medicine

## 2014-06-15 DIAGNOSIS — Z1239 Encounter for other screening for malignant neoplasm of breast: Secondary | ICD-10-CM

## 2014-06-15 NOTE — Telephone Encounter (Signed)
Spoke to the pt.  She has agreed to schedule a mammogram.  Needs it after June 1st and after 5 PM.

## 2014-06-21 ENCOUNTER — Encounter: Payer: Self-pay | Admitting: Internal Medicine

## 2014-06-21 NOTE — Telephone Encounter (Signed)
Please call pt and schedule Mammogram.

## 2014-06-21 NOTE — Telephone Encounter (Signed)
We don't schedule mammogram appointments.

## 2014-06-28 ENCOUNTER — Other Ambulatory Visit: Payer: Self-pay | Admitting: Internal Medicine

## 2014-06-28 ENCOUNTER — Encounter: Payer: Self-pay | Admitting: Internal Medicine

## 2014-06-28 ENCOUNTER — Ambulatory Visit (INDEPENDENT_AMBULATORY_CARE_PROVIDER_SITE_OTHER): Payer: Self-pay | Admitting: Internal Medicine

## 2014-06-28 VITALS — BP 110/70 | HR 66 | Temp 98.4°F | Resp 20 | Ht 61.5 in | Wt 195.0 lb

## 2014-06-28 DIAGNOSIS — E039 Hypothyroidism, unspecified: Secondary | ICD-10-CM

## 2014-06-28 DIAGNOSIS — I10 Essential (primary) hypertension: Secondary | ICD-10-CM

## 2014-06-28 LAB — COMPREHENSIVE METABOLIC PANEL
ALT: 22 U/L (ref 0–35)
AST: 18 U/L (ref 0–37)
Albumin: 4.1 g/dL (ref 3.5–5.2)
Alkaline Phosphatase: 64 U/L (ref 39–117)
BUN: 13 mg/dL (ref 6–23)
CALCIUM: 9.6 mg/dL (ref 8.4–10.5)
CO2: 29 meq/L (ref 19–32)
Chloride: 107 mEq/L (ref 96–112)
Creatinine, Ser: 0.86 mg/dL (ref 0.40–1.20)
GFR: 72.47 mL/min (ref >60.00)
GLUCOSE: 97 mg/dL (ref 70–99)
Potassium: 4.5 mEq/L (ref 3.5–5.1)
Sodium: 143 mEq/L (ref 135–145)
TOTAL PROTEIN: 7.1 g/dL (ref 6.0–8.3)
Total Bilirubin: 0.5 mg/dL (ref 0.2–1.2)

## 2014-06-28 LAB — CBC WITH DIFFERENTIAL/PLATELET
BASOS ABS: 0 10*3/uL (ref 0.0–0.1)
BASOS PCT: 0.6 % (ref 0.0–3.0)
EOS ABS: 0.3 10*3/uL (ref 0.0–0.7)
Eosinophils Relative: 5 % (ref 0.0–5.0)
HEMATOCRIT: 41.3 % (ref 36.0–46.0)
Hemoglobin: 14.1 g/dL (ref 12.0–15.0)
LYMPHS ABS: 1.7 10*3/uL (ref 0.7–4.0)
Lymphocytes Relative: 29.1 % (ref 12.0–46.0)
MCHC: 34 g/dL (ref 30.0–36.0)
MCV: 88.6 fl (ref 78.0–100.0)
MONO ABS: 0.4 10*3/uL (ref 0.1–1.0)
Monocytes Relative: 6.8 % (ref 3.0–12.0)
Neutro Abs: 3.5 10*3/uL (ref 1.4–7.7)
Neutrophils Relative %: 58.5 % (ref 43.0–77.0)
PLATELETS: 169 10*3/uL (ref 150.0–400.0)
RBC: 4.66 Mil/uL (ref 3.87–5.11)
RDW: 12.9 % (ref 11.5–15.5)
WBC: 6 10*3/uL (ref 4.0–10.5)

## 2014-06-28 LAB — TSH: TSH: 1.87 u[IU]/mL (ref 0.35–4.50)

## 2014-06-28 LAB — LIPID PANEL
Cholesterol: 178 mg/dL (ref 0–200)
HDL: 54.9 mg/dL (ref >39.00)
LDL CALC: 100 mg/dL — AB (ref 0–99)
NONHDL: 123.1
Total CHOL/HDL Ratio: 3
Triglycerides: 114 mg/dL (ref 0.0–149.0)
VLDL: 22.8 mg/dL (ref 0.0–40.0)

## 2014-06-28 MED ORDER — BUPROPION HCL ER (XL) 300 MG PO TB24: 300.0000 mg | ORAL_TABLET | Freq: Every day | ORAL | Status: DC

## 2014-06-28 MED ORDER — ALPRAZOLAM 0.25 MG PO TABS: ORAL_TABLET | ORAL | Status: DC

## 2014-06-28 MED ORDER — PHENTERMINE HCL 30 MG PO CAPS: 30.0000 mg | ORAL_CAPSULE | ORAL | Status: DC

## 2014-06-28 NOTE — Telephone Encounter (Signed)
Please see message and advise 

## 2014-06-28 NOTE — Progress Notes (Signed)
   Subjective:    Patient ID: Sandra Freeman, female    DOB: [birth year], 54 y.o.   MRN: [id number]  HPI  54 year old patient who is seen today for follow-up.  She has a history of anxiety, depression and has done quite well.  She has not been seen in over 6 months.  She has history of hypertension but has been controlled off medications for some time.  She has asthma, allergic rhinitis which has been stable.  She has mild obesity and asked about phentermine She does have a history of GERD and gastroparesis.  She is having some mild reflux symptoms Complaints today include intermittent fatigue, occasional swelling of the hands and feet and easy bruisability.  She also describes some occasional cramping mainly in her toes and feet.  Wt Readings from Last 3 Encounters:  06/28/14 195 lb (88.451 kg)  10/13/13 180 lb (81.647 kg)  03/25/13 188 lb (85.276 kg)    Review of Systems  Constitutional: Positive for fatigue.  HENT: Negative for congestion, dental problem, hearing loss, rhinorrhea, sinus pressure, sore throat and tinnitus.   Eyes: Negative for pain, discharge and visual disturbance.  Respiratory: Negative for cough and shortness of breath.   Cardiovascular: Negative for chest pain, palpitations and leg swelling.  Gastrointestinal: Negative for nausea, vomiting, abdominal pain, diarrhea, constipation, blood in stool and abdominal distention.  Genitourinary: Negative for dysuria, urgency, frequency, hematuria, flank pain, vaginal bleeding, vaginal discharge, difficulty urinating, vaginal pain and pelvic pain.  Musculoskeletal: Positive for myalgias. Negative for joint swelling, arthralgias and gait problem.  Skin: Negative for rash.  Neurological: Negative for dizziness, syncope, speech difficulty, weakness, numbness and headaches.  Hematological: Negative for adenopathy.  Psychiatric/Behavioral: Negative for behavioral problems, dysphoric mood and agitation. The patient is not  nervous/anxious.        Objective:   Physical Exam  Constitutional: She is oriented to person, place, and time. She appears well-developed and well-nourished.  HENT:  Head: Normocephalic.  Right Ear: External ear normal.  Left Ear: External ear normal.  Mouth/Throat: Oropharynx is clear and moist.  Eyes: Conjunctivae and EOM are normal. Pupils are equal, round, and reactive to light.  Neck: Normal range of motion. Neck supple. No thyromegaly present.  Cardiovascular: Normal rate, regular rhythm, normal heart sounds and intact distal pulses.   Pulmonary/Chest: Effort normal and breath sounds normal.  Abdominal: Soft. Bowel sounds are normal. She exhibits no mass. There is no tenderness.  Musculoskeletal: Normal range of motion.  Lymphadenopathy:    She has no cervical adenopathy.  Neurological: She is alert and oriented to person, place, and time.  Reflexes brisk  Skin: Skin is warm and dry. No rash noted.  A few scattered fading bruises over her arms  Psychiatric: She has a normal mood and affect. Her behavior is normal.          Assessment & Plan:   Obesity.  Weight loss encouraged History of hypertension.  Remains normotensive off medications Anxiety, depression.  Medications updated History of LFT abnormalities.  Will recheck

## 2014-06-28 NOTE — Progress Notes (Signed)
Pre visit review using our clinic review tool, if applicable. No additional management support is needed unless otherwise documented below in the visit note. 

## 2014-06-28 NOTE — Patient Instructions (Signed)
Limit your sodium (Salt) intake    It is important that you exercise regularly, at least 20 minutes 3 to 4 times per week.  If you develop chest pain or shortness of breath seek  medical attention.  You need to lose weight.  Consider a lower calorie diet and regular exercise.  Return in one year for follow-up

## 2014-06-30 NOTE — Telephone Encounter (Signed)
Sandra Freeman, pt is aware that this would not be taken care of till Monday.

## 2014-07-01 ENCOUNTER — Ambulatory Visit: Payer: PRIVATE HEALTH INSURANCE

## 2014-07-01 NOTE — Telephone Encounter (Signed)
PA has been submitted.

## 2014-07-04 NOTE — Telephone Encounter (Signed)
PA was faxed on 07/01/14.  I haven't received a response back.

## 2014-07-05 NOTE — Telephone Encounter (Signed)
PA has been approved and faxed back to pharmacy.  Patient can follow up with her pharmacy.

## 2014-07-06 ENCOUNTER — Ambulatory Visit
Admission: RE | Admit: 2014-07-06 | Discharge: 2014-07-06 | Disposition: A | Discharge status: Home / Self Care | Payer: BLUE CROSS/BLUE SHIELD | Source: Ambulatory Visit | Attending: Internal Medicine | Admitting: Internal Medicine

## 2014-07-06 DIAGNOSIS — Z1239 Encounter for other screening for malignant neoplasm of breast: Secondary | ICD-10-CM

## 2014-07-28 ENCOUNTER — Encounter: Payer: Self-pay | Admitting: Gastroenterology

## 2014-08-26 ENCOUNTER — Other Ambulatory Visit: Payer: Self-pay | Admitting: Internal Medicine

## 2014-08-26 NOTE — Telephone Encounter (Signed)
ok 

## 2014-09-28 ENCOUNTER — Other Ambulatory Visit: Payer: Self-pay | Admitting: Internal Medicine

## 2014-11-08 ENCOUNTER — Other Ambulatory Visit: Payer: Self-pay | Admitting: Internal Medicine

## 2015-01-12 ENCOUNTER — Other Ambulatory Visit: Payer: Self-pay

## 2015-01-12 NOTE — Telephone Encounter (Signed)
Rx request for phentermine 30 mg capsule- Take 1 capsule by mouth in the morning #90.  Pharmacy:  [facility]

## 2015-01-13 MED ORDER — PHENTERMINE HCL 30 MG PO CAPS: 30.0000 mg | ORAL_CAPSULE | Freq: Every morning | ORAL | Status: DC

## 2015-02-06 ENCOUNTER — Encounter: Payer: Self-pay | Admitting: Internal Medicine

## 2015-02-06 ENCOUNTER — Other Ambulatory Visit: Payer: Self-pay | Admitting: Internal Medicine

## 2015-02-06 MED ORDER — ALBUTEROL SULFATE HFA 108 (90 BASE) MCG/ACT IN AERS: 2.0000 | INHALATION_SPRAY | RESPIRATORY_TRACT | Status: DC | PRN

## 2015-02-06 MED ORDER — FLUTICASONE-SALMETEROL 250-50 MCG/DOSE IN AEPB: 1.0000 | INHALATION_SPRAY | Freq: Two times a day (BID) | RESPIRATORY_TRACT | Status: DC

## 2015-02-06 MED ORDER — PHENTERMINE HCL 30 MG PO CAPS: 30.0000 mg | ORAL_CAPSULE | Freq: Every morning | ORAL | Status: DC

## 2015-02-06 NOTE — Addendum Note (Signed)
Addended by: Marian Sorrow on: 02/06/2015 02:20 PM   Modules accepted: Orders

## 2015-05-18 ENCOUNTER — Other Ambulatory Visit: Payer: Self-pay

## 2015-05-18 MED ORDER — ALPRAZOLAM 0.25 MG PO TABS: ORAL_TABLET | ORAL | Status: DC

## 2015-06-27 ENCOUNTER — Emergency Department
Admission: EM | Admit: 2015-06-27 | Discharge: 2015-06-27 | Disposition: A | Discharge status: Home / Self Care | Payer: BLUE CROSS/BLUE SHIELD | Attending: Emergency Medicine | Admitting: Emergency Medicine

## 2015-06-27 ENCOUNTER — Emergency Department: Payer: BLUE CROSS/BLUE SHIELD

## 2015-06-27 DIAGNOSIS — F329 Major depressive disorder, single episode, unspecified: Secondary | ICD-10-CM | POA: Insufficient documentation

## 2015-06-27 DIAGNOSIS — S60222A Contusion of left hand, initial encounter: Secondary | ICD-10-CM | POA: Diagnosis not present

## 2015-06-27 DIAGNOSIS — Y9389 Activity, other specified: Secondary | ICD-10-CM | POA: Insufficient documentation

## 2015-06-27 DIAGNOSIS — M79642 Pain in left hand: Secondary | ICD-10-CM | POA: Diagnosis not present

## 2015-06-27 DIAGNOSIS — J45909 Unspecified asthma, uncomplicated: Secondary | ICD-10-CM | POA: Diagnosis not present

## 2015-06-27 DIAGNOSIS — Z8719 Personal history of other diseases of the digestive system: Secondary | ICD-10-CM | POA: Insufficient documentation

## 2015-06-27 DIAGNOSIS — S6992XA Unspecified injury of left wrist, hand and finger(s), initial encounter: Secondary | ICD-10-CM | POA: Diagnosis not present

## 2015-06-27 DIAGNOSIS — Y999 Unspecified external cause status: Secondary | ICD-10-CM | POA: Insufficient documentation

## 2015-06-27 DIAGNOSIS — I10 Essential (primary) hypertension: Secondary | ICD-10-CM | POA: Diagnosis not present

## 2015-06-27 DIAGNOSIS — W228XXA Striking against or struck by other objects, initial encounter: Secondary | ICD-10-CM | POA: Diagnosis not present

## 2015-06-27 DIAGNOSIS — Z79899 Other long term (current) drug therapy: Secondary | ICD-10-CM | POA: Diagnosis not present

## 2015-06-27 DIAGNOSIS — E039 Hypothyroidism, unspecified: Secondary | ICD-10-CM | POA: Insufficient documentation

## 2015-06-27 DIAGNOSIS — Y929 Unspecified place or not applicable: Secondary | ICD-10-CM | POA: Insufficient documentation

## 2015-06-27 MED ORDER — NAPROXEN 500 MG PO TABS: 500.0000 mg | ORAL_TABLET | Freq: Two times a day (BID) | ORAL | Status: DC

## 2015-06-27 NOTE — Discharge Instructions (Signed)
Hand Contusion A hand contusion is a deep bruise on your hand area. Contusions are the result of an injury that caused bleeding under the skin. The contusion may turn blue, purple, or yellow. Minor injuries will give you a painless contusion, but more severe contusions may stay painful and swollen for a few weeks. CAUSES  A contusion is usually caused by a blow, trauma, or direct force to an area of the body. SYMPTOMS   Swelling and redness of the injured area.  Discoloration of the injured area.  Tenderness and soreness of the injured area.  Pain. DIAGNOSIS  The diagnosis can be made by taking a history and performing a physical exam. An X-ray, CT scan, or MRI may be needed to determine if there were any associated injuries, such as broken bones (fractures). TREATMENT  Often, the best treatment for a hand contusion is resting, elevating, icing, and applying cold compresses to the injured area. Over-the-counter medicines may also be recommended for pain control. HOME CARE INSTRUCTIONS   Put ice on the injured area.  Put ice in a plastic bag.  Place a towel between your skin and the bag.  Leave the ice on for 15-20 minutes, 03-04 times a day.  Only take over-the-counter or prescription medicines as directed by your caregiver. Your caregiver may recommend avoiding anti-inflammatory medicines (aspirin, ibuprofen, and naproxen) for 48 hours because these medicines may increase bruising.  If told, use an elastic wrap as directed. This can help reduce swelling. You may remove the wrap for sleeping, showering, and bathing. If your fingers become numb, cold, or blue, take the wrap off and reapply it more loosely.  Elevate your hand with pillows to reduce swelling.  Avoid overusing your hand if it is painful. SEEK IMMEDIATE MEDICAL CARE IF:   You have increased redness, swelling, or pain in your hand.  Your swelling or pain is not relieved with medicines.  You have loss of feeling in  your hand or are unable to move your fingers.  Your hand turns cold or blue.  You have pain when you move your fingers.  Your hand becomes warm to the touch.  Your contusion does not improve in 2 days. MAKE SURE YOU:   Understand these instructions.  Will watch your condition.  Will get help right away if you are not doing well or get worse.   This information is not intended to replace advice given to you by your health care provider. Make sure you discuss any questions you have with your health care provider.   Document Released: 06/29/2001 Document Revised: 10/02/2011 Document Reviewed: 07/01/2011 Elsevier Interactive Patient Education 2016 Elsevier [employer]  Cryotherapy Cryotherapy is when you put ice on your injury. Ice helps lessen pain and puffiness (swelling) after an injury. Ice works the best when you start using it in the first 24 to 48 hours after an injury. HOME CARE  Put a dry or damp towel between the ice pack and your skin.  You may press gently on the ice pack.  Leave the ice on for no more than 10 to 20 minutes at a time.  Check your skin after 5 minutes to make sure your skin is okay.  Rest at least 20 minutes between ice pack uses.  Stop using ice when your skin loses feeling (numbness).  Do not use ice on someone who cannot tell you when it hurts. This includes small children and people with memory problems (dementia). GET HELP RIGHT AWAY IF:  You have white spots on your skin.  Your skin turns blue or pale.  Your skin feels waxy or hard.  Your puffiness gets worse. MAKE SURE YOU:   Understand these instructions.  Will watch your condition.  Will get help right away if you are not doing well or get worse.   This information is not intended to replace advice given to you by your health care provider. Make sure you discuss any questions you have with your health care provider.   Document Released: 06/26/2007 Document Revised: 04/01/2011  Document Reviewed: 08/30/2010 Elsevier Interactive Patient Education [employer].

## 2015-06-27 NOTE — ED Notes (Signed)
Pt in with co left hand pain after hitting it against a bike.

## 2015-06-27 NOTE — ED Provider Notes (Signed)
[facility] Emergency Department Provider Note  ____________________________________________  Time seen: Approximately 10:16 PM  I have reviewed the triage vital signs and the nursing notes.   HISTORY  Chief Complaint Hand Pain    HPI Sandra Freeman is a 55 y.o. female, NAD, who presents to the emergency department with 2 hour history of left hand pain. She was disassembling an exercise bicycle so she could return it to [facility] around 8:30pm tonight when she hit the back of her hand, between the thumb and index finger, on a piece of the bike. The area began to swell immediately and she felt tingling in all of her fingers. She states that she now has slight numbness in the tip her left thumb and index finger. Her thumb, index, and middle finger also feel "stiff". She denies any broken skin. Has not taken anything    Past Medical History  Diagnosis Date  . ALLERGIC RHINITIS 02/06/2007  . ANEMIA, IRON DEFICIENCY 06/17/2007  . ANXIETY DISORDER 06/02/2007  . ASCUS PAP 04/23/2007  . ASTHMA 02/06/2007  . DEPRESSION 02/06/2007  . DIZZINESS 02/13/2007  . Essential hypertension, benign 04/16/2007  . FATTY LIVER DISEASE 06/17/2007  . Gastroparesis 10/21/2008  . GERD 02/06/2007  . HYPERTENSION 03/18/2008  . HYPOTHYROIDISM 02/06/2007  . LIVER FUNCTION TESTS, ABNORMAL 02/10/2007  . NONSPEC ELEVATION OF LEVELS OF TRANSAMINASE/LDH 06/02/2007  . OBESITY 02/06/2007  . 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 WRIST SPRAIN, RIGHT 05/23/2008    Patient Active Problem List   Diagnosis Date Noted  . TELOGEN EFFLUVIUM 01/26/2009  . GASTROPARESIS 10/21/2008  . Essential hypertension 03/18/2008  . ANEMIA, IRON DEFICIENCY 06/17/2007  . FATTY LIVER DISEASE 06/17/2007  . ANXIETY DISORDER 06/02/2007  . ASCUS PAP 04/23/2007  . LIVER FUNCTION TESTS, ABNORMAL 02/10/2007  . OBESITY 02/06/2007  . DEPRESSION 02/06/2007  . ALLERGIC RHINITIS 02/06/2007  . ASTHMA 02/06/2007  . GERD 02/06/2007    Past  Surgical History  Procedure Laterality Date  . Tubal ligation    . Knee surgery    . Cataract extraction    . Posterior cervical laminectomy    . Knee arthroscopy      left  . Anterior fusion cervical spine      Current Outpatient Rx  Name  Route  Sig  Dispense  Refill  . albuterol (PROVENTIL HFA;VENTOLIN HFA) 108 (90 Base) MCG/ACT inhaler   Inhalation   Inhale 2 puffs into the lungs every 4 (four) hours as needed (wheezing).   1 Inhaler   2   . ALPRAZolam (XANAX) 0.25 MG tablet      TAKE ONE TO TWO TABLETS BY MOUTH ONCE DAILY AS NEEDED   60 tablet   2   . buPROPion (WELLBUTRIN XL) 300 MG 24 hr tablet   Oral   Take 1 tablet (300 mg total) by mouth daily.   90 tablet   4   . clotrimazole-betamethasone (LOTRISONE) cream      APPLY  CREAM TOPICALLY TWICE DAILY   30 g   2   . Fluticasone-Salmeterol (ADVAIR DISKUS) 250-50 MCG/DOSE AEPB   Inhalation   Inhale 1 puff into the lungs every 12 (twelve) hours.   60 each   1   . ibuprofen (ADVIL,MOTRIN) 200 MG tablet   Oral   Take 400 mg by mouth every 6 (six) hours as needed (pain).         . naproxen (NAPROSYN) 500 MG tablet   Oral   Take 1 tablet (500 mg total)  by mouth 2 (two) times daily with a meal.   14 tablet   0   . phentermine 30 MG capsule   Oral   Take 1 capsule (30 mg total) by mouth every morning.   90 capsule   0     Needs Physical, No further refills till seen.     Allergies Review of patient's allergies indicates no known allergies.  Family History  Problem Relation Age of Onset  . Cancer Mother     breast  . Heart disease Mother   . COPD Mother   . Diabetes Father   . Cancer Father     lung  . Diabetes Sister   . Diabetes Brother   . Diabetes Brother     Social History Social History  Substance Use Topics  . Smoking status: Never Smoker   . Smokeless tobacco: Never Used  . Alcohol Use: Yes     Comment: social      Review of Systems  Constitutional: No  fever/chills Cardiovascular: No chest pain. Respiratory: No shortness of breath.  Gastrointestinal: No abdominal pain.  No nausea, vomiting. Musculoskeletal: Positive for pain of left hand.  Skin: Positive for swelling of back of left hand. Negative for rash. Neurological: Positive tingling in left hand.  Negative for headaches, focal weakness or numbness.  10-point ROS otherwise negative.  ____________________________________________   PHYSICAL EXAM:  VITAL SIGNS: ED Triage Vitals  Enc Vitals Group     BP 06/27/15 2105 147/89 mmHg     Pulse Rate 06/27/15 2104 75     Resp 06/27/15 2104 16     Temp 06/27/15 2104 98.1 F (36.7 C)     Temp Source 06/27/15 2104 Oral     SpO2 06/27/15 2104 98 %     Weight 06/27/15 2104 152 lb (68.947 kg)     Height 06/27/15 2104 5' (1.524 m)     Head Cir --      Peak Flow --      Pain Score 06/27/15 2108 7     Pain Loc --      Pain Edu? --      Excl. in [place]? --      Constitutional: Alert and oriented. Well appearing and in no acute distress. Eyes: Conjunctivae are normal.  Head: Atraumatic. Cardiovascular:Good peripheral circulation with 2+ pulses in the left upper extremity.  Capillary refill is brisk in all digits of the left upper extremity.  Respiratory: Normal respiratory effort without tachypnea or retractions.  Musculoskeletal: Tenderness to palpation of the dorsum of the left hand, left thumb, and left index finger. Limited flexion of thumb, index, and middle fingers of left hand due to pain but can achieve full range of motion. Full active and passive ROM at left wrist.  Neurologic:  Sensation to light touch grossly normal in the left upper extremity. Normal speech and language. No gross focal neurologic deficits are appreciated.  Skin: Soft tissue swelling and bruising between the carpal bones of the left thumb and left index finger on the dorsum of the left hand. Skin is warm, dry and intact. No rash noted. Psychiatric: Mood and affect  are normal. Speech and behavior are normal. Patient exhibits appropriate insight and judgement.   ____________________________________________   LABS  None  ____________________________________________  EKG  None ____________________________________________  RADIOLOGY I have personally viewed and evaluated these images (plain radiographs) as part of my medical decision making, as well as reviewing the written report by the radiologist.  Dg  Hand Complete Left  06/27/2015  CLINICAL DATA:  Left hand pain at the index MCP and thumb after injury on exercise bike. Initial encounter. EXAM: LEFT HAND - COMPLETE 3+ VIEW COMPARISON:  03/22/2009 FINDINGS: There is no evidence of fracture or dislocation. No opaque foreign body. Chronic benign lucent appearance of the thumb distal phalanx along the IP joint. IMPRESSION: Negative for fracture. Electronically Signed   By: Monte Fantasia M.D.   On: 06/27/2015 21:30    ____________________________________________    PROCEDURES  Procedure(s) performed: None   Medications - No data to display   ____________________________________________   INITIAL IMPRESSION / ASSESSMENT AND PLAN / ED COURSE  Pertinent imaging results that were available during my care of the patient were reviewed by me and considered in my medical decision making (see chart for details).  Patient's diagnosis is consistent with contusion of left hand. Patient will be discharged home with prescriptions for naproxen to take as directed. Patient advised to apply ice to the affected area 20 minutes 3-4 times daily and keep it elevated when not using. Patient is to follow up with her primary care provider or [facility] if symptoms persist past this treatment course. Patient is given ED precautions to return to the ED for any worsening or new symptoms.    ____________________________________________  FINAL CLINICAL IMPRESSION(S) / ED DIAGNOSES  Final diagnoses:   Hand contusion, left, initial encounter      NEW MEDICATIONS STARTED DURING THIS VISIT:  Discharge Medication List as of 06/27/2015 10:31 PM    START taking these medications   Details  naproxen (NAPROSYN) 500 MG tablet Take 1 tablet (500 mg total) by mouth 2 (two) times daily with a meal., Starting 06/27/2015, Until Discontinued, [place], PA-C 06/27/15 2307  Nance Pear, MD 07/01/15 [id number]

## 2015-06-29 ENCOUNTER — Encounter: Payer: Self-pay | Admitting: Family Medicine

## 2015-06-29 ENCOUNTER — Ambulatory Visit (INDEPENDENT_AMBULATORY_CARE_PROVIDER_SITE_OTHER): Payer: BLUE CROSS/BLUE SHIELD | Admitting: Family Medicine

## 2015-06-29 ENCOUNTER — Encounter: Payer: Self-pay | Admitting: *Deleted

## 2015-06-29 DIAGNOSIS — S60222D Contusion of left hand, subsequent encounter: Secondary | ICD-10-CM | POA: Diagnosis not present

## 2015-06-29 NOTE — Progress Notes (Signed)
Subjective:    Patient ID: Sandra Freeman, female    DOB: [birth year], 55 y.o.   MRN: [id number]  HPI  Sandra Freeman 55 year old female who presents today for an ED follow up for a hand contusion that occurred on 06/27/2015.  ED notes are as follows:   She was disassembling an exercise bicycle so she could return it to [facility] around 8:30pm tonight when she hit the back of her hand, between the thumb and index finger, on a piece of the bike. The area began to swell immediately and she felt tingling in all of her fingers. She states that she now has slight numbness in the tip her left thumb and index finger. Her thumb, index, and middle finger also feel "stiff". She denies any broken skin. Has not taken anything   Today, pain and swelling have improved per patient with ice and elevation. She reports that she has not taken naproxen 500 mg BID yet due to not filling the medication due to not picking it up yet at the pharmacy. Associated symptom of mild tingling is noted in hand and fingers.   Review of images notes negative for fracture.   Review of Systems  Constitutional: Negative for fever and chills.  Respiratory: Negative for cough and shortness of breath.   Cardiovascular: Negative for chest pain and palpitations.  Gastrointestinal: Negative for nausea, vomiting, abdominal pain and diarrhea.  Skin:       Bruising on left hand  Neurological: Negative for dizziness and headaches.   Past Medical History  Diagnosis Date  . ALLERGIC RHINITIS 02/06/2007  . ANEMIA, IRON DEFICIENCY 06/17/2007  . ANXIETY DISORDER 06/02/2007  . ASCUS PAP 04/23/2007  . ASTHMA 02/06/2007  . DEPRESSION 02/06/2007  . DIZZINESS 02/13/2007  . Essential hypertension, benign 04/16/2007  . FATTY LIVER DISEASE 06/17/2007  . Gastroparesis 10/21/2008  . GERD 02/06/2007  . HYPERTENSION 03/18/2008  . HYPOTHYROIDISM 02/06/2007  . LIVER FUNCTION TESTS, ABNORMAL 02/10/2007  . NONSPEC ELEVATION OF LEVELS OF TRANSAMINASE/LDH 06/02/2007  .  OBESITY 02/06/2007  . 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 WRIST SPRAIN, RIGHT 05/23/2008     Social History   Social History  . Marital Status: Divorced    Spouse Name: N/A  . Number of Children: N/A  . Years of Education: N/A   Occupational History  . Not on file.   Social History Main Topics  . Smoking status: Never Smoker   . Smokeless tobacco: Never Used  . Alcohol Use: Yes     Comment: social   . Drug Use: No  . Sexual Activity: Not on file   Other Topics Concern  . Not on file   Social History Narrative    Past Surgical History  Procedure Laterality Date  . Tubal ligation    . Knee surgery    . Cataract extraction    . Posterior cervical laminectomy    . Knee arthroscopy      left  . Anterior fusion cervical spine      Family History  Problem Relation Age of Onset  . Cancer Mother     breast  . Heart disease Mother   . COPD Mother   . Diabetes Father   . Cancer Father     lung  . Diabetes Sister   . Diabetes Brother   . Diabetes Brother     No Known Allergies  Current Outpatient Prescriptions on File Prior to Visit  Medication Sig Dispense Refill  . albuterol (PROVENTIL  HFA;VENTOLIN HFA) 108 (90 Base) MCG/ACT inhaler Inhale 2 puffs into the lungs every 4 (four) hours as needed (wheezing). 1 Inhaler 2  . ALPRAZolam (XANAX) 0.25 MG tablet TAKE ONE TO TWO TABLETS BY MOUTH ONCE DAILY AS NEEDED 60 tablet 2  . buPROPion (WELLBUTRIN XL) 300 MG 24 hr tablet Take 1 tablet (300 mg total) by mouth daily. 90 tablet 4  . clotrimazole-betamethasone (LOTRISONE) cream APPLY  CREAM TOPICALLY TWICE DAILY 30 g 2  . Fluticasone-Salmeterol (ADVAIR DISKUS) 250-50 MCG/DOSE AEPB Inhale 1 puff into the lungs every 12 (twelve) hours. 60 each 1  . ibuprofen (ADVIL,MOTRIN) 200 MG tablet Take 400 mg by mouth every 6 (six) hours as needed (pain).    . naproxen (NAPROSYN) 500 MG tablet Take 1 tablet (500 mg total) by mouth 2 (two) times daily with a meal. 14 tablet 0  . phentermine 30  MG capsule Take 1 capsule (30 mg total) by mouth every morning. 90 capsule 0   No current facility-administered medications on file prior to visit.    BP 112/74 mmHg  Pulse 78  Temp(Src) 98.4 F (36.9 C) (Oral)  Ht 5' (1.524 m)  Wt 168 lb 4 oz (76.318 kg)  BMI 32.86 kg/m2  SpO2 99%  LMP 01/04/2010      Objective:   Physical Exam  Constitutional: She is oriented to person, place, and time.  Cardiovascular: Normal rate and regular rhythm.   Capillary refill is brisk  Pulmonary/Chest: Effort normal and breath sounds normal. She has no wheezes. She has no rales.  Musculoskeletal:  Minimal soft tissue swelling and bruising between the carpal bones of the left thumb and left index finger on the dorsum of the left hand. Skin is warm, dry and intact. No rash noted. See photo below  Neurological: She is alert and oriented to person, place, and time.  Sensation is normal to light touch in left hand.  Skin: Skin is warm and dry.          Assessment & Plan:  1. Contusion of left hand, subsequent encounter Contusion is improving per patient report and she is able to move her fingers without difficulty. Sensation is WNL and capillary refill is brisk. Advised patient to begin naproxen 500 mg BID as directed by her ED provider. Further advised her to RTC for further evaluation and follow up if she does not improve with treatment. Patient voiced understanding and agreed with plan.  Delano Metz, FNP-C

## 2015-06-29 NOTE — Progress Notes (Signed)
Pre visit review using our clinic review tool, if applicable. No additional management support is needed unless otherwise documented below in the visit note. 

## 2015-06-29 NOTE — Patient Instructions (Signed)
Start naproxen as directed by ED provider. Follow up if area does not continue to improve with treatment with you provider.  Contusion A contusion is a deep bruise. Contusions are the result of a blunt injury to tissues and muscle fibers under the skin. The injury causes bleeding under the skin. The skin overlying the contusion may turn blue, purple, or yellow. Minor injuries will give you a painless contusion, but more severe contusions may stay painful and swollen for a few weeks.  CAUSES  This condition is usually caused by a blow, trauma, or direct force to an area of the body. SYMPTOMS  Symptoms of this condition include:  Swelling of the injured area.  Pain and tenderness in the injured area.  Discoloration. The area may have redness and then turn blue, purple, or yellow. DIAGNOSIS  This condition is diagnosed based on a physical exam and medical history. An X-ray, CT scan, or MRI may be needed to determine if there are any associated injuries, such as broken bones (fractures). TREATMENT  Specific treatment for this condition depends on what area of the body was injured. In general, the best treatment for a contusion is resting, icing, applying pressure to (compression), and elevating the injured area. This is often called the RICE strategy. Over-the-counter anti-inflammatory medicines may also be recommended for pain control.  HOME CARE INSTRUCTIONS   Rest the injured area.  If directed, apply ice to the injured area:  Put ice in a plastic bag.  Place a towel between your skin and the bag.  Leave the ice on for 20 minutes, 2-3 times per day.  If directed, apply light compression to the injured area using an elastic bandage. Make sure the bandage is not wrapped too tightly. Remove and reapply the bandage as directed by your health care provider.  If possible, raise (elevate) the injured area above the level of your heart while you are sitting or lying down.  Take  over-the-counter and prescription medicines only as told by your health care provider. SEEK MEDICAL CARE IF:  Your symptoms do not improve after several days of treatment.  Your symptoms get worse.  You have difficulty moving the injured area. SEEK IMMEDIATE MEDICAL CARE IF:   You have severe pain.  You have numbness in a hand or foot.  Your hand or foot turns pale or cold.   This information is not intended to replace advice given to you by your health care provider. Make sure you discuss any questions you have with your health care provider.   Document Released: 10/17/2004 Document Revised: 09/28/2014 Document Reviewed: 05/25/2014 Elsevier Interactive Patient Education [employer].

## 2015-06-30 ENCOUNTER — Other Ambulatory Visit: Payer: Self-pay | Admitting: *Deleted

## 2015-06-30 MED ORDER — BUPROPION HCL ER (XL) 300 MG PO TB24: 300.0000 mg | ORAL_TABLET | Freq: Every day | ORAL | Status: DC

## 2015-07-09 DIAGNOSIS — T148 Other injury of unspecified body region: Secondary | ICD-10-CM | POA: Diagnosis not present

## 2015-07-09 DIAGNOSIS — R52 Pain, unspecified: Secondary | ICD-10-CM | POA: Diagnosis not present

## 2015-07-09 DIAGNOSIS — M5489 Other dorsalgia: Secondary | ICD-10-CM | POA: Diagnosis not present

## 2015-07-11 ENCOUNTER — Ambulatory Visit: Payer: BLUE CROSS/BLUE SHIELD | Admitting: Internal Medicine

## 2015-07-11 DIAGNOSIS — Z0289 Encounter for other administrative examinations: Secondary | ICD-10-CM

## 2015-07-14 ENCOUNTER — Encounter: Payer: Self-pay | Admitting: Internal Medicine

## 2015-07-14 ENCOUNTER — Ambulatory Visit (INDEPENDENT_AMBULATORY_CARE_PROVIDER_SITE_OTHER): Payer: BLUE CROSS/BLUE SHIELD | Admitting: Internal Medicine

## 2015-07-14 VITALS — BP 130/84 | HR 87 | Temp 98.7°F | Resp 20 | Ht 60.0 in | Wt 172.0 lb

## 2015-07-14 DIAGNOSIS — E669 Obesity, unspecified: Secondary | ICD-10-CM

## 2015-07-14 DIAGNOSIS — I10 Essential (primary) hypertension: Secondary | ICD-10-CM

## 2015-07-14 DIAGNOSIS — S161XXD Strain of muscle, fascia and tendon at neck level, subsequent encounter: Secondary | ICD-10-CM

## 2015-07-14 DIAGNOSIS — J3089 Other allergic rhinitis: Secondary | ICD-10-CM | POA: Diagnosis not present

## 2015-07-14 MED ORDER — CLOTRIMAZOLE-BETAMETHASONE 1-0.05 % EX CREA: TOPICAL_CREAM | CUTANEOUS | Status: DC

## 2015-07-14 MED ORDER — BUPROPION HCL ER (XL) 300 MG PO TB24: 300.0000 mg | ORAL_TABLET | Freq: Every day | ORAL | Status: DC

## 2015-07-14 MED ORDER — ALBUTEROL SULFATE HFA 108 (90 BASE) MCG/ACT IN AERS: 2.0000 | INHALATION_SPRAY | RESPIRATORY_TRACT | Status: DC | PRN

## 2015-07-14 MED ORDER — PHENTERMINE HCL 30 MG PO CAPS: 30.0000 mg | ORAL_CAPSULE | Freq: Every morning | ORAL | Status: DC

## 2015-07-14 MED ORDER — ALPRAZOLAM 0.25 MG PO TABS: ORAL_TABLET | ORAL | Status: DC

## 2015-07-14 NOTE — Patient Instructions (Addendum)
You  may move around, but avoid painful motions and activities.   Call or return to clinic prn if these symptoms worsen or fail to improve as anticipated.   Return in 6 months for follow-up   Cervical Sprain A cervical sprain is when the tissues (ligaments) that hold the neck bones in place stretch or tear. HOME CARE   Put ice on the injured area.  Put ice in a plastic bag.  Place a towel between your skin and the bag.  Leave the ice on for 15-20 minutes, 3-4 times a day.  You may have been given a collar to wear. This collar keeps your neck from moving while you heal.  Do not take the collar off unless told by your doctor.  If you have long hair, keep it outside of the collar.  Ask your doctor before changing the position of your collar. You may need to change its position over time to make it more comfortable.  If you are allowed to take off the collar for cleaning or bathing, follow your doctor's instructions on how to do it safely.  Keep your collar clean by wiping it with mild soap and water. Dry it completely. If the collar has removable pads, remove them every 1-2 days to hand wash them with soap and water. Allow them to air dry. They should be dry before you wear them in the collar.  Do not drive while wearing the collar.  Only take medicine as told by your doctor.  Keep all doctor visits as told.  Keep all physical therapy visits as told.  Adjust your work station so that you have good posture while you work.  Avoid positions and activities that make your problems worse.  Warm up and stretch before being active. GET HELP IF:  Your pain is not controlled with medicine.  You cannot take less pain medicine over time as planned.  Your activity level does not improve as expected. GET HELP RIGHT AWAY IF:   You are bleeding.  Your stomach is upset.  You have an allergic reaction to your medicine.  You develop new problems that you cannot explain.  You  lose feeling (become numb) or you cannot move any part of your body (paralysis).  You have tingling or weakness in any part of your body.  Your symptoms get worse. Symptoms include:  Pain, soreness, stiffness, puffiness (swelling), or a burning feeling in your neck.  Pain when your neck is touched.  Shoulder or upper back pain.  Limited ability to move your neck.  Headache.  Dizziness.  Your hands or arms feel week, lose feeling, or tingle.  Muscle spasms.  Difficulty swallowing or chewing. MAKE SURE YOU:   Understand these instructions.  Will watch your condition.  Will get help right away if you are not doing well or get worse.   This information is not intended to replace advice given to you by your health care provider. Make sure you discuss any questions you have with your health care provider.   Document Released: 06/26/2007 Document Revised: 09/09/2012 Document Reviewed: 07/15/2012 Elsevier Interactive Patient Education [employer].
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
## 2015-07-14 NOTE — Progress Notes (Signed)
Pre visit review using our clinic review tool, if applicable. No additional management support is needed unless otherwise documented below in the visit note. 

## 2015-07-18 ENCOUNTER — Encounter: Payer: Self-pay | Admitting: Internal Medicine

## 2015-07-19 ENCOUNTER — Other Ambulatory Visit: Payer: Self-pay | Admitting: Internal Medicine

## 2015-07-19 MED ORDER — CYCLOBENZAPRINE HCL 10 MG PO TABS: 10.0000 mg | ORAL_TABLET | Freq: Three times a day (TID) | ORAL | Status: DC

## 2015-07-19 MED ORDER — HYDROCODONE-ACETAMINOPHEN 5-325 MG PO TABS: 1.0000 | ORAL_TABLET | Freq: Four times a day (QID) | ORAL | Status: DC | PRN

## 2015-07-26 ENCOUNTER — Encounter: Payer: Self-pay | Admitting: Internal Medicine

## 2015-07-31 DIAGNOSIS — Z7689 Persons encountering health services in other specified circumstances: Secondary | ICD-10-CM

## 2015-08-03 NOTE — Telephone Encounter (Signed)
Dr. Raliegh Ip, please advise if okay to write extended excuse for work. See message.

## 2015-08-07 DIAGNOSIS — Z7689 Persons encountering health services in other specified circumstances: Secondary | ICD-10-CM

## 2015-08-14 ENCOUNTER — Ambulatory Visit (INDEPENDENT_AMBULATORY_CARE_PROVIDER_SITE_OTHER): Payer: BLUE CROSS/BLUE SHIELD | Admitting: Internal Medicine

## 2015-08-14 ENCOUNTER — Encounter: Payer: Self-pay | Admitting: Internal Medicine

## 2015-08-14 VITALS — BP 120/80 | HR 85 | Temp 98.4°F | Ht 60.0 in | Wt 170.0 lb

## 2015-08-14 DIAGNOSIS — R945 Abnormal results of liver function studies: Secondary | ICD-10-CM | POA: Diagnosis not present

## 2015-08-14 DIAGNOSIS — J4520 Mild intermittent asthma, uncomplicated: Secondary | ICD-10-CM

## 2015-08-14 DIAGNOSIS — I10 Essential (primary) hypertension: Secondary | ICD-10-CM
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
## 2015-08-14 NOTE — Progress Notes (Signed)
Pre visit review using our clinic review tool, if applicable. No additional management support is needed unless otherwise documented below in the visit note. 

## 2015-08-14 NOTE — Patient Instructions (Signed)
Limit your sodium (Salt) intake  Please check your blood pressure on a regular basis.  If it is consistently greater than 150/90, please make an office appointment.  Return in 6 months for follow-up  Call or return to clinic prn if these symptoms worsen or fail to improve as anticipated.

## 2015-08-21 ENCOUNTER — Other Ambulatory Visit: Payer: Self-pay | Admitting: Internal Medicine

## 2015-08-21 DIAGNOSIS — Z1231 Encounter for screening mammogram for malignant neoplasm of breast: Secondary | ICD-10-CM

## 2015-08-29 ENCOUNTER — Ambulatory Visit
Admission: RE | Admit: 2015-08-29 | Discharge: 2015-08-29 | Disposition: A | Discharge status: Home / Self Care | Payer: BLUE CROSS/BLUE SHIELD | Source: Ambulatory Visit | Attending: Internal Medicine | Admitting: Internal Medicine

## 2015-08-29 DIAGNOSIS — Z1231 Encounter for screening mammogram for malignant neoplasm of breast: Secondary | ICD-10-CM | POA: Diagnosis not present

## 2015-08-30 ENCOUNTER — Encounter: Payer: Self-pay | Admitting: Internal Medicine

## 2015-09-12 DIAGNOSIS — S335XXA Sprain of ligaments of lumbar spine, initial encounter: Secondary | ICD-10-CM | POA: Diagnosis not present

## 2015-09-15 ENCOUNTER — Other Ambulatory Visit: Payer: Self-pay | Admitting: Internal Medicine

## 2015-09-18 ENCOUNTER — Other Ambulatory Visit: Payer: Self-pay | Admitting: Sports Medicine

## 2015-09-18 DIAGNOSIS — M545 Low back pain: Secondary | ICD-10-CM

## 2015-09-20 ENCOUNTER — Other Ambulatory Visit: Payer: Self-pay | Admitting: Internal Medicine

## 2015-09-20 NOTE — Telephone Encounter (Signed)
Pt requesting refill on Hydrocodone, last refill 6/28, # 30. Please advise.

## 2015-09-20 NOTE — Telephone Encounter (Signed)
Okay for refill?  

## 2015-09-21 MED ORDER — HYDROCODONE-ACETAMINOPHEN 5-325 MG PO TABS: 1.0000 | ORAL_TABLET | Freq: Four times a day (QID) | ORAL | 0 refills | Status: DC | PRN

## 2015-09-21 NOTE — Telephone Encounter (Signed)
Left message on voicemail  Rx ready for pickup will be at the front desk.[name] Kitchen Rx printed and signed by Dr. Sarajane Jews.

## 2015-09-24 ENCOUNTER — Other Ambulatory Visit: Payer: BLUE CROSS/BLUE SHIELD

## 2015-10-03 ENCOUNTER — Ambulatory Visit
Admission: RE | Admit: 2015-10-03 | Discharge: 2015-10-03 | Disposition: A | Discharge status: Home / Self Care | Payer: BLUE CROSS/BLUE SHIELD | Source: Ambulatory Visit | Attending: Sports Medicine | Admitting: Sports Medicine

## 2015-10-03 DIAGNOSIS — M4806 Spinal stenosis, lumbar region: Secondary | ICD-10-CM | POA: Diagnosis not present

## 2015-10-03 DIAGNOSIS — M545 Low back pain: Secondary | ICD-10-CM

## 2015-10-05 DIAGNOSIS — M4806 Spinal stenosis, lumbar region: Secondary | ICD-10-CM | POA: Diagnosis not present

## 2015-10-09 DIAGNOSIS — F4323 Adjustment disorder with mixed anxiety and depressed mood: Secondary | ICD-10-CM | POA: Diagnosis not present

## 2015-10-19 DIAGNOSIS — M545 Low back pain: Secondary | ICD-10-CM | POA: Diagnosis not present

## 2015-10-19 DIAGNOSIS — M4806 Spinal stenosis, lumbar region: Secondary | ICD-10-CM | POA: Diagnosis not present

## 2015-10-19 DIAGNOSIS — M5416 Radiculopathy, lumbar region: Secondary | ICD-10-CM | POA: Diagnosis not present

## 2015-10-23 DIAGNOSIS — F4323 Adjustment disorder with mixed anxiety and depressed mood: Secondary | ICD-10-CM | POA: Diagnosis not present

## 2015-10-25 DIAGNOSIS — M5126 Other intervertebral disc displacement, lumbar region: Secondary | ICD-10-CM | POA: Diagnosis not present

## 2015-10-25 DIAGNOSIS — M79604 Pain in right leg: Secondary | ICD-10-CM | POA: Diagnosis not present

## 2015-10-25 DIAGNOSIS — M545 Low back pain: Secondary | ICD-10-CM | POA: Diagnosis not present

## 2015-10-25 DIAGNOSIS — M5416 Radiculopathy, lumbar region: Secondary | ICD-10-CM | POA: Diagnosis not present

## 2015-10-31 ENCOUNTER — Other Ambulatory Visit: Payer: Self-pay | Admitting: Internal Medicine

## 2015-10-31 DIAGNOSIS — F4323 Adjustment disorder with mixed anxiety and depressed mood: Secondary | ICD-10-CM | POA: Diagnosis not present

## 2015-11-13 DIAGNOSIS — F4323 Adjustment disorder with mixed anxiety and depressed mood: Secondary | ICD-10-CM | POA: Diagnosis not present

## 2015-11-15 DIAGNOSIS — M5126 Other intervertebral disc displacement, lumbar region: Secondary | ICD-10-CM | POA: Diagnosis not present

## 2015-11-27 DIAGNOSIS — F4323 Adjustment disorder with mixed anxiety and depressed mood: Secondary | ICD-10-CM | POA: Diagnosis not present

## 2015-12-05 ENCOUNTER — Encounter: Payer: Self-pay | Admitting: Neurology

## 2015-12-05 ENCOUNTER — Ambulatory Visit (INDEPENDENT_AMBULATORY_CARE_PROVIDER_SITE_OTHER): Payer: BLUE CROSS/BLUE SHIELD | Admitting: Neurology

## 2015-12-05 DIAGNOSIS — R202 Paresthesia of skin: Secondary | ICD-10-CM | POA: Insufficient documentation
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
## 2015-12-20 ENCOUNTER — Encounter: Payer: Self-pay | Admitting: Internal Medicine

## 2015-12-20 MED ORDER — PREDNISONE 20 MG PO TABS: 20.0000 mg | ORAL_TABLET | Freq: Two times a day (BID) | ORAL | 0 refills | Status: DC

## 2015-12-20 MED ORDER — BENZONATATE 100 MG PO CAPS: 100.0000 mg | ORAL_CAPSULE | Freq: Three times a day (TID) | ORAL | 0 refills | Status: DC | PRN

## 2015-12-20 NOTE — Telephone Encounter (Signed)
Discussed pt with Dr.K, verbal orders given for Prednisone 20 mg BID # 12, Tessalon Capsules 100 mg TID PRN # 20.  Spoke to pt, told her Dr.K said he will prescribe Prednisone 20 mg BID and Tessaon capsules or OTC medication for cough whichever you would like? Pt verbalized understanding and said send Rx for Tessalon capsules. Told her okay both Rx's sent to pharmacy. Pt verbalized understanding.

## 2015-12-27 ENCOUNTER — Encounter: Payer: Self-pay | Admitting: Internal Medicine

## 2016-02-02 ENCOUNTER — Encounter: Payer: BLUE CROSS/BLUE SHIELD | Admitting: Neurology

## 2016-02-13 ENCOUNTER — Encounter: Payer: Self-pay | Admitting: Internal Medicine

## 2016-02-13 ENCOUNTER — Encounter: Payer: Self-pay | Admitting: Family Medicine

## 2016-02-21 DIAGNOSIS — F4323 Adjustment disorder with mixed anxiety and depressed mood: Secondary | ICD-10-CM | POA: Diagnosis not present

## 2016-03-08 ENCOUNTER — Ambulatory Visit (INDEPENDENT_AMBULATORY_CARE_PROVIDER_SITE_OTHER): Payer: BLUE CROSS/BLUE SHIELD | Admitting: Internal Medicine

## 2016-03-08 ENCOUNTER — Encounter: Payer: Self-pay | Admitting: Internal Medicine

## 2016-03-08 VITALS — BP 150/88 | HR 79 | Temp 98.3°F | Ht 60.0 in | Wt 174.4 lb

## 2016-03-08 DIAGNOSIS — F411 Generalized anxiety disorder: Secondary | ICD-10-CM

## 2016-03-08 DIAGNOSIS — I10 Essential (primary) hypertension: Secondary | ICD-10-CM

## 2016-03-08 MED ORDER — HYDROCHLOROTHIAZIDE 25 MG PO TABS: 25.0000 mg | ORAL_TABLET | Freq: Every day | ORAL | 3 refills | Status: DC

## 2016-03-08 NOTE — Progress Notes (Signed)
Pre visit review using our clinic review tool, if applicable. No additional management support is needed unless otherwise documented below in the visit note. 

## 2016-03-08 NOTE — Patient Instructions (Addendum)
Limit your sodium (Salt) intake  Please check your blood pressure on a regular basis.  If it is consistently greater than 150/90, please make an office appointment.    It is important that you exercise regularly, at least 20 minutes 3 to 4 times per week.  If you develop chest pain or shortness of breath seek  medical attention.  You need to lose weight.  Consider a lower calorie diet and regular exercise.  DASH Eating Plan DASH stands for "Dietary Approaches to Stop Hypertension." The DASH eating plan is a healthy eating plan that has been shown to reduce high blood pressure (hypertension). Additional health benefits may include reducing the risk of type 2 diabetes mellitus, heart disease, and stroke. The DASH eating plan may also help with weight loss. What do I need to know about the DASH eating plan? For the DASH eating plan, you will follow these general guidelines:  Choose foods with less than 150 milligrams of sodium per serving (as listed on the food label).  Use salt-free seasonings or herbs instead of table salt or sea salt.  Check with your health care provider or pharmacist before using salt substitutes.  Eat lower-sodium products. These are often labeled as "low-sodium" or "no salt added."  Eat fresh foods. Avoid eating a lot of canned foods.  Eat more vegetables, fruits, and low-fat dairy products.  Choose whole grains. Look for the word "whole" as the first word in the ingredient list.  Choose fish and skinless chicken or [place] more often than red meat. Limit fish, poultry, and meat to 6 oz (170 g) each day.  Limit sweets, desserts, sugars, and sugary drinks.  Choose heart-healthy fats.  Eat more home-cooked food and less restaurant, buffet, and fast food.  Limit fried foods.  Do not fry foods. Cook foods using methods such as baking, boiling, grilling, and broiling instead.  When eating at a restaurant, ask that your food be prepared with less salt, or no salt  if possible. What foods can I eat? Seek help from a dietitian for individual calorie needs. Grains  Whole grain or whole wheat bread. Brown rice. Whole grain or whole wheat pasta. Quinoa, bulgur, and whole grain cereals. Low-sodium cereals. Corn or whole wheat flour tortillas. Whole grain cornbread. Whole grain crackers. Low-sodium crackers. Vegetables  Fresh or frozen vegetables (raw, steamed, roasted, or grilled). Low-sodium or reduced-sodium tomato and vegetable juices. Low-sodium or reduced-sodium tomato sauce and paste. Low-sodium or reduced-sodium canned vegetables. Fruits  All fresh, canned (in natural juice), or frozen fruits. Meat and Other Protein Products  Ground beef (85% or leaner), grass-fed beef, or beef trimmed of fat. Skinless chicken or [place]. Ground chicken or [place]. Pork trimmed of fat. All fish and seafood. Eggs. Dried beans, peas, or lentils. Unsalted nuts and seeds. Unsalted canned beans. Dairy  Low-fat dairy products, such as skim or 1% milk, 2% or reduced-fat cheeses, low-fat ricotta or cottage cheese, or plain low-fat yogurt. Low-sodium or reduced-sodium cheeses. Fats and Oils  Tub margarines without trans fats. Light or reduced-fat mayonnaise and salad dressings (reduced sodium). Avocado. Safflower, olive, or canola oils. Natural peanut or almond butter. Other  Unsalted popcorn and pretzels. The items listed above may not be a complete list of recommended foods or beverages. Contact your dietitian for more options.  What foods are not recommended? Grains  White bread. White pasta. White rice. Refined cornbread. Bagels and croissants. Crackers that contain trans fat. Vegetables  Creamed or fried vegetables. Vegetables in a  cheese sauce. Regular canned vegetables. Regular canned tomato sauce and paste. Regular tomato and vegetable juices. Fruits  Canned fruit in light or heavy syrup. Fruit juice. Meat and Other Protein Products  Fatty cuts of meat. Ribs, chicken  wings, bacon, sausage, bologna, salami, chitterlings, fatback, hot dogs, bratwurst, and packaged luncheon meats. Salted nuts and seeds. Canned beans with salt. Dairy  Whole or 2% milk, cream, half-and-half, and cream cheese. Whole-fat or sweetened yogurt. Full-fat cheeses or blue cheese. Nondairy creamers and whipped toppings. Processed cheese, cheese spreads, or cheese curds. Condiments  Onion and garlic salt, seasoned salt, table salt, and sea salt. Canned and packaged gravies. Worcestershire sauce. Tartar sauce. Barbecue sauce. Teriyaki sauce. Soy sauce, including reduced sodium. Steak sauce. Fish sauce. Oyster sauce. Cocktail sauce. Horseradish. Ketchup and mustard. Meat flavorings and tenderizers. Bouillon cubes. Hot sauce. Tabasco sauce. Marinades. Taco seasonings. Relishes. Fats and Oils  Butter, stick margarine, lard, shortening, ghee, and bacon fat. Coconut, palm kernel, or palm oils. Regular salad dressings. Other  Pickles and olives. Salted popcorn and pretzels. The items listed above may not be a complete list of foods and beverages to avoid. Contact your dietitian for more information.  Where can I find more information? National Heart, Lung, and Blood Institute: travelstabloid.com This information is not intended to replace advice given to you by your health care provider. Make sure you discuss any questions you have with your health care provider. Document Released: 12/27/2010 Document Revised: 06/15/2015 Document Reviewed: 11/11/2012 Elsevier Interactive Patient Education  2017 [employer].

## 2016-03-08 NOTE — Progress Notes (Signed)
Subjective:    Patient ID: Sandra Freeman, female    DOB: 1960-05-29, 56 y.o.   MRN: [id number]  HPI  56 year old patient who has a history of essential hypertension treated with diuretic therapy in the past.  More recently she has been monitored off therapy.  She is been under considerable stress and blood pressure readings have been consistently elevated.  Home monitoring reveals systolic readings between 160 and 235 and diastolic readings from 36-14.  She was seen in neurology in November with a blood pressure 140/80.  Blood pressure on arrival here 150 over 88  Past Medical History:  Diagnosis Date  . ALLERGIC RHINITIS 02/06/2007  . ANEMIA, IRON DEFICIENCY 06/17/2007  . ANXIETY DISORDER 06/02/2007  . ASCUS PAP 04/23/2007  . ASTHMA 02/06/2007  . DEPRESSION 02/06/2007  . DIZZINESS 02/13/2007  . Essential hypertension, benign 04/16/2007  . FATTY LIVER DISEASE 06/17/2007  . Gastroparesis 10/21/2008  . GERD 02/06/2007  . HYPERTENSION 03/18/2008  . HYPOTHYROIDISM 02/06/2007  . LIVER FUNCTION TESTS, ABNORMAL 02/10/2007  . NONSPEC ELEVATION OF LEVELS OF TRANSAMINASE/LDH 06/02/2007  . OBESITY 02/06/2007  . 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 WRIST SPRAIN, RIGHT 05/23/2008     Social History   Social History  . Marital status: Divorced    Spouse name: N/A  . Number of children: 1  . Years of education: 2 years of college   Occupational History  . Occupational hygienist    Social History Main Topics  . Smoking status: Never Smoker  . Smokeless tobacco: Never Used  . Alcohol use Yes     Comment: social   . Drug use: No  . Sexual activity: Not on file   Other Topics Concern  . Not on file   Social History Narrative   Lives at home with finace.   Right-handed.   2-2.5 cups caffeine daily.    Past Surgical History:  Procedure Laterality Date  . ANTERIOR FUSION CERVICAL SPINE    . CATARACT EXTRACTION    . KNEE ARTHROSCOPY     left  . KNEE SURGERY    . POSTERIOR CERVICAL LAMINECTOMY    . TUBAL  LIGATION      Family History  Problem Relation Age of Onset  . Cancer Mother     breast  . Heart disease Mother   . COPD Mother   . Diabetes Father   . Cancer Father     lung  . Diabetes Sister   . Diabetes Brother   . Diabetes Brother     No Known Allergies  Current Outpatient Prescriptions on File Prior to Visit  Medication Sig Dispense Refill  . albuterol (PROVENTIL HFA;VENTOLIN HFA) 108 (90 Base) MCG/ACT inhaler Inhale 2 puffs into the lungs every 4 (four) hours as needed (wheezing). 1 Inhaler 5  . ALPRAZolam (XANAX) 0.25 MG tablet TAKE ONE TO TWO TABLETS BY MOUTH ONCE DAILY AS NEEDED 60 tablet 2  . benzonatate (TESSALON) 100 MG capsule Take 1 capsule (100 mg total) by mouth 3 (three) times daily as needed for cough. 20 capsule 0  . buPROPion (WELLBUTRIN XL) 300 MG 24 hr tablet Take 1 tablet (300 mg total) by mouth daily. 90 tablet 1  . clotrimazole-betamethasone (LOTRISONE) cream APPLY  CREAM TOPICALLY TWICE DAILY 30 g 2  . cyclobenzaprine (FLEXERIL) 10 MG tablet Take 1 tablet (10 mg total) by mouth 3 (three) times daily. 30 tablet 1  . Fluticasone-Salmeterol (ADVAIR DISKUS) 250-50 MCG/DOSE AEPB Inhale 1 puff into the lungs every 12 (  twelve) hours. 60 each 1  . HYDROcodone-acetaminophen (NORCO/VICODIN) 5-325 MG tablet Take 1 tablet by mouth every 6 (six) hours as needed for moderate pain. 30 tablet 0  . ibuprofen (ADVIL,MOTRIN) 200 MG tablet Take 400 mg by mouth every 6 (six) hours as needed (pain).    . phentermine 30 MG capsule TAKE ONE CAPSULE BY MOUTH ONCE DAILY IN THE MORNING 90 capsule 0   No current facility-administered medications on file prior to visit.     BP (!) 150/88 (BP Location: Left Arm, Patient Position: Sitting, Cuff Size: Normal)   Pulse 79   Temp 98.3 F (36.8 C) (Oral)   Ht 5' (1.524 m)   Wt 174 lb 6.4 oz (79.1 kg)   LMP 01/04/2010   SpO2 99%   BMI 34.06 kg/m     Review of Systems  Constitutional: Negative.   HENT: Negative for congestion,  dental problem, hearing loss, rhinorrhea, sinus pressure, sore throat and tinnitus.   Eyes: Negative for pain, discharge and visual disturbance.  Respiratory: Negative for cough and shortness of breath.   Cardiovascular: Negative for chest pain, palpitations and leg swelling.  Gastrointestinal: Negative for abdominal distention, abdominal pain, blood in stool, constipation, diarrhea, nausea and vomiting.  Genitourinary: Negative for difficulty urinating, dysuria, flank pain, frequency, hematuria, pelvic pain, urgency, vaginal bleeding, vaginal discharge and vaginal pain.  Musculoskeletal: Negative for arthralgias, gait problem and joint swelling.  Skin: Negative for rash.  Neurological: Negative for dizziness, syncope, speech difficulty, weakness, numbness and headaches.  Hematological: Negative for adenopathy.  Psychiatric/Behavioral: Positive for sleep disturbance. Negative for agitation, behavioral problems and dysphoric mood. The patient is nervous/anxious.        Objective:   Physical Exam  Constitutional: She is oriented to person, place, and time. She appears well-developed and well-nourished.  Blood pressure 150/90  HENT:  Head: Normocephalic.  Right Ear: External ear normal.  Left Ear: External ear normal.  Mouth/Throat: Oropharynx is clear and moist.  Eyes: Conjunctivae and EOM are normal. Pupils are equal, round, and reactive to light.  Neck: Normal range of motion. Neck supple. No thyromegaly present.  Cardiovascular: Normal rate, regular rhythm, normal heart sounds and intact distal pulses.   Pulmonary/Chest: Effort normal and breath sounds normal.  Abdominal: Soft. Bowel sounds are normal. She exhibits no mass. There is no tenderness.  Musculoskeletal: Normal range of motion.  Lymphadenopathy:    She has no cervical adenopathy.  Neurological: She is alert and oriented to person, place, and time.  Skin: Skin is warm and dry. No rash noted.  Psychiatric: She has a normal  mood and affect. Her behavior is normal.          Assessment & Plan:   Mild hypertension. Situational stress  We'll place on a DASH diet.  Nonpharmacologic measures discussed.  Will place on hydrochlorothiazide 25 mg daily Home blood pressure monitoring.  Encouraged Follow-up 3 months or as needed  Continue counseling with behavioral health  Nyoka Cowden

## 2016-03-13 DIAGNOSIS — F4323 Adjustment disorder with mixed anxiety and depressed mood: Secondary | ICD-10-CM | POA: Diagnosis not present

## 2016-03-20 DIAGNOSIS — F4323 Adjustment disorder with mixed anxiety and depressed mood: Secondary | ICD-10-CM | POA: Diagnosis not present

## 2016-03-21 ENCOUNTER — Other Ambulatory Visit: Payer: Self-pay | Admitting: Internal Medicine

## 2016-03-21 ENCOUNTER — Encounter: Payer: Self-pay | Admitting: Internal Medicine

## 2016-03-21 MED ORDER — DIAZEPAM 5 MG PO TABS: 5.0000 mg | ORAL_TABLET | Freq: Two times a day (BID) | ORAL | 0 refills | Status: DC | PRN

## 2016-04-03 DIAGNOSIS — F4323 Adjustment disorder with mixed anxiety and depressed mood: Secondary | ICD-10-CM | POA: Diagnosis not present

## 2016-04-04 ENCOUNTER — Other Ambulatory Visit: Payer: Self-pay | Admitting: Internal Medicine

## 2016-04-24 DIAGNOSIS — F4323 Adjustment disorder with mixed anxiety and depressed mood: Secondary | ICD-10-CM | POA: Diagnosis not present

## 2016-05-08 DIAGNOSIS — F4323 Adjustment disorder with mixed anxiety and depressed mood: Secondary | ICD-10-CM | POA: Diagnosis not present

## 2016-05-22 DIAGNOSIS — F4323 Adjustment disorder with mixed anxiety and depressed mood: Secondary | ICD-10-CM | POA: Diagnosis not present

## 2016-05-23 ENCOUNTER — Encounter: Payer: Self-pay | Admitting: Internal Medicine

## 2016-05-24 ENCOUNTER — Other Ambulatory Visit: Payer: Self-pay | Admitting: Internal Medicine

## 2016-05-24 MED ORDER — ESCITALOPRAM OXALATE 10 MG PO TABS: 10.0000 mg | ORAL_TABLET | Freq: Every day | ORAL | 2 refills | Status: DC

## 2016-06-19 DIAGNOSIS — F4323 Adjustment disorder with mixed anxiety and depressed mood: Secondary | ICD-10-CM | POA: Diagnosis not present

## 2016-07-02 ENCOUNTER — Other Ambulatory Visit: Payer: Self-pay | Admitting: Internal Medicine

## 2016-07-09 ENCOUNTER — Other Ambulatory Visit: Payer: Self-pay | Admitting: Internal Medicine

## 2016-07-12 ENCOUNTER — Other Ambulatory Visit: Payer: Self-pay | Admitting: Internal Medicine

## 2016-08-02 ENCOUNTER — Other Ambulatory Visit: Payer: Self-pay | Admitting: Internal Medicine

## 2016-08-02 DIAGNOSIS — Z1231 Encounter for screening mammogram for malignant neoplasm of breast: Secondary | ICD-10-CM

## 2016-08-09 ENCOUNTER — Encounter: Payer: Self-pay | Admitting: Internal Medicine

## 2016-08-09 ENCOUNTER — Ambulatory Visit (INDEPENDENT_AMBULATORY_CARE_PROVIDER_SITE_OTHER): Payer: BLUE CROSS/BLUE SHIELD | Admitting: Internal Medicine

## 2016-08-09 VITALS — BP 142/82 | HR 90 | Temp 98.6°F | Ht 60.0 in | Wt 173.8 lb

## 2016-08-09 DIAGNOSIS — H00014 Hordeolum externum left upper eyelid: Secondary | ICD-10-CM

## 2016-08-09 DIAGNOSIS — R300 Dysuria: Secondary | ICD-10-CM

## 2016-08-09 DIAGNOSIS — I10 Essential (primary) hypertension: Secondary | ICD-10-CM

## 2016-08-09 LAB — POC URINALSYSI DIPSTICK (AUTOMATED)
Bilirubin, UA: NEGATIVE
GLUCOSE UA: NEGATIVE
Ketones, UA: NEGATIVE
Leukocytes, UA: NEGATIVE
Nitrite, UA: NEGATIVE
PH UA: 6 (ref 5.0–8.0)
Protein, UA: NEGATIVE
RBC UA: NEGATIVE
SPEC GRAV UA: 1.02 (ref 1.010–1.025)
UROBILINOGEN UA: 0.2 U/dL

## 2016-08-09 MED ORDER — SULFACETAMIDE SODIUM 10 % OP SOLN: 2.0000 [drp] | OPHTHALMIC | 0 refills | Status: DC

## 2016-08-09 NOTE — Patient Instructions (Addendum)

## 2016-08-09 NOTE — Progress Notes (Signed)
Subjective:    Patient ID: Sandra Freeman, female    DOB: [birth year], 58 y.o.   MRN: [id number]  HPI  56 year old patient who has essential hypertension.  She presents with a 2 to three-week history of a painful nodule involving the left upper medial eyelid. She has a history of anxiety, depression, which has improved.  She continues to be followed by a Social worker. She complains of some dark urine with increased odor.  The urinalysis reviewed today.  She does have a history of frequent UTIs  Past Medical History:  Diagnosis Date  . ALLERGIC RHINITIS 02/06/2007  . ANEMIA, IRON DEFICIENCY 06/17/2007  . ANXIETY DISORDER 06/02/2007  . ASCUS PAP 04/23/2007  . ASTHMA 02/06/2007  . DEPRESSION 02/06/2007  . DIZZINESS 02/13/2007  . Essential hypertension, benign 04/16/2007  . FATTY LIVER DISEASE 06/17/2007  . Gastroparesis 10/21/2008  . GERD 02/06/2007  . HYPERTENSION 03/18/2008  . HYPOTHYROIDISM 02/06/2007  . LIVER FUNCTION TESTS, ABNORMAL 02/10/2007  . NONSPEC ELEVATION OF LEVELS OF TRANSAMINASE/LDH 06/02/2007  . OBESITY 02/06/2007  . 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 WRIST SPRAIN, RIGHT 05/23/2008     Social History   Social History  . Marital status: Divorced    Spouse name: N/A  . Number of children: 1  . Years of education: 2 years of college   Occupational History  . Occupational hygienist    Social History Main Topics  . Smoking status: Never Smoker  . Smokeless tobacco: Never Used  . Alcohol use Yes     Comment: social   . Drug use: No  . Sexual activity: Not on file   Other Topics Concern  . Not on file   Social History Narrative   Lives at home with finace.   Right-handed.   2-2.5 cups caffeine daily.    Past Surgical History:  Procedure Laterality Date  . ANTERIOR FUSION CERVICAL SPINE    . CATARACT EXTRACTION    . KNEE ARTHROSCOPY     left  . KNEE SURGERY    . POSTERIOR CERVICAL LAMINECTOMY    . TUBAL LIGATION      Family History  Problem Relation Age of Onset  .  Cancer Mother        breast  . Heart disease Mother   . COPD Mother   . Diabetes Father   . Cancer Father        lung  . Diabetes Sister   . Diabetes Brother   . Diabetes Brother     No Known Allergies  Current Outpatient Prescriptions on File Prior to Visit  Medication Sig Dispense Refill  . albuterol (PROVENTIL HFA;VENTOLIN HFA) 108 (90 Base) MCG/ACT inhaler Inhale 2 puffs into the lungs every 4 (four) hours as needed (wheezing). 1 Inhaler 5  . buPROPion (WELLBUTRIN XL) 300 MG 24 hr tablet Take 1 tablet (300 mg total) by mouth daily. 90 tablet 1  . buPROPion (WELLBUTRIN XL) 300 MG 24 hr tablet TAKE ONE TABLET BY MOUTH ONCE DAILY 90 tablet 1  . clotrimazole-betamethasone (LOTRISONE) cream APPLY  CREAM TOPICALLY TWICE DAILY 30 g 2  . diazepam (VALIUM) 5 MG tablet TAKE 1 TABLET BY MOUTH EVERY 12 HOURS AS NEEDED FOR ANXIETY 60 tablet 0  . escitalopram (LEXAPRO) 10 MG tablet Take 1 tablet (10 mg total) by mouth daily. 60 tablet 2  . Fluticasone-Salmeterol (ADVAIR DISKUS) 250-50 MCG/DOSE AEPB Inhale 1 puff into the lungs every 12 (twelve) hours. 60 each 1  . hydrochlorothiazide (HYDRODIURIL) 25 MG tablet  Take 1 tablet (25 mg total) by mouth daily. 90 tablet 3  . ibuprofen (ADVIL,MOTRIN) 200 MG tablet Take 400 mg by mouth every 6 (six) hours as needed (pain).     No current facility-administered medications on file prior to visit.     BP (!) 142/82 (BP Location: Left Arm, Patient Position: Sitting, Cuff Size: Normal)   Pulse 90   Temp 98.6 F (37 C) (Oral)   Ht 5' (1.524 m)   Wt 173 lb 12.8 oz (78.8 kg)   LMP 01/04/2010   SpO2 98%   BMI 33.94 kg/m     Review of Systems  Constitutional: Negative.   HENT: Negative for congestion, dental problem, hearing loss, rhinorrhea, sinus pressure, sore throat and tinnitus.   Eyes: Positive for pain and redness. Negative for discharge and visual disturbance.  Respiratory: Negative for cough and shortness of breath.   Cardiovascular:  Negative for chest pain, palpitations and leg swelling.  Gastrointestinal: Negative for abdominal distention, abdominal pain, blood in stool, constipation, diarrhea, nausea and vomiting.  Genitourinary: Negative for difficulty urinating, dysuria, flank pain, frequency, hematuria, pelvic pain, urgency, vaginal bleeding, vaginal discharge and vaginal pain.  Musculoskeletal: Negative for arthralgias, gait problem and joint swelling.  Skin: Negative for rash.  Neurological: Negative for dizziness, syncope, speech difficulty, weakness, numbness and headaches.  Hematological: Negative for adenopathy.  Psychiatric/Behavioral: Negative for agitation, behavioral problems and dysphoric mood. The patient is nervous/anxious.        Objective:   Physical Exam  Constitutional: She appears well-developed and well-nourished. No distress.  Eyes:  Small stye palpable involving the left upper medial lid Conjunctivae slightly injected          Assessment & Plan:   Stye left upper lid.  Will treat with antibiotic eyedrops and warm compresses.  Patient information  dispensed Anxiety disorder, improved.  Follow-up.  [facility]

## 2016-08-20 ENCOUNTER — Encounter: Payer: Self-pay | Admitting: Internal Medicine

## 2016-08-27 ENCOUNTER — Encounter: Payer: Self-pay | Admitting: Internal Medicine

## 2016-08-27 ENCOUNTER — Ambulatory Visit (INDEPENDENT_AMBULATORY_CARE_PROVIDER_SITE_OTHER): Payer: BLUE CROSS/BLUE SHIELD | Admitting: Internal Medicine

## 2016-08-27 VITALS — BP 118/72 | HR 94 | Temp 98.5°F | Ht 60.0 in | Wt 176.2 lb

## 2016-08-27 DIAGNOSIS — I10 Essential (primary) hypertension: Secondary | ICD-10-CM | POA: Diagnosis not present

## 2016-08-27 DIAGNOSIS — J4520 Mild intermittent asthma, uncomplicated: Secondary | ICD-10-CM | POA: Diagnosis not present

## 2016-08-27 MED ORDER — BENZONATATE 200 MG PO CAPS: 200.0000 mg | ORAL_CAPSULE | Freq: Two times a day (BID) | ORAL | 0 refills | Status: DC | PRN

## 2016-08-27 NOTE — Progress Notes (Signed)
Subjective:    Patient ID: Sandra Freeman, female    DOB: 02/19/1960, 56 y.o.   MRN: [id number]  HPI  56 year old patient who has a history of mild uncomplicated asthma.  The past few days she has developed increasing cough and congestion, but no wheezing.  She has been using some albuterol rescue medications. She has been on maintenance Advair in the past but has not required any recently.  She was seen earlier first I involving the left upper medial lid that continues to slowly improve  Past Medical History:  Diagnosis Date  . ALLERGIC RHINITIS 02/06/2007  . ANEMIA, IRON DEFICIENCY 06/17/2007  . ANXIETY DISORDER 06/02/2007  . ASCUS PAP 04/23/2007  . ASTHMA 02/06/2007  . DEPRESSION 02/06/2007  . DIZZINESS 02/13/2007  . Essential hypertension, benign 04/16/2007  . FATTY LIVER DISEASE 06/17/2007  . Gastroparesis 10/21/2008  . GERD 02/06/2007  . HYPERTENSION 03/18/2008  . HYPOTHYROIDISM 02/06/2007  . LIVER FUNCTION TESTS, ABNORMAL 02/10/2007  . NONSPEC ELEVATION OF LEVELS OF TRANSAMINASE/LDH 06/02/2007  . OBESITY 02/06/2007  . 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 WRIST SPRAIN, RIGHT 05/23/2008     Social History   Social History  . Marital status: Divorced    Spouse name: N/A  . Number of children: 1  . Years of education: 2 years of college   Occupational History  . Occupational hygienist    Social History Main Topics  . Smoking status: Never Smoker  . Smokeless tobacco: Never Used  . Alcohol use Yes     Comment: social   . Drug use: No  . Sexual activity: Not on file   Other Topics Concern  . Not on file   Social History Narrative   Lives at home with finace.   Right-handed.   2-2.5 cups caffeine daily.    Past Surgical History:  Procedure Laterality Date  . ANTERIOR FUSION CERVICAL SPINE    . CATARACT EXTRACTION    . KNEE ARTHROSCOPY     left  . KNEE SURGERY    . POSTERIOR CERVICAL LAMINECTOMY    . TUBAL LIGATION      Family History  Problem Relation Age of Onset  . Cancer  Mother        breast  . Heart disease Mother   . COPD Mother   . Diabetes Father   . Cancer Father        lung  . Diabetes Sister   . Diabetes Brother   . Diabetes Brother     No Known Allergies  Current Outpatient Prescriptions on File Prior to Visit  Medication Sig Dispense Refill  . albuterol (PROVENTIL HFA;VENTOLIN HFA) 108 (90 Base) MCG/ACT inhaler Inhale 2 puffs into the lungs every 4 (four) hours as needed (wheezing). 1 Inhaler 5  . buPROPion (WELLBUTRIN XL) 300 MG 24 hr tablet Take 1 tablet (300 mg total) by mouth daily. 90 tablet 1  . clotrimazole-betamethasone (LOTRISONE) cream APPLY  CREAM TOPICALLY TWICE DAILY 30 g 2  . diazepam (VALIUM) 5 MG tablet TAKE 1 TABLET BY MOUTH EVERY 12 HOURS AS NEEDED FOR ANXIETY 60 tablet 0  . escitalopram (LEXAPRO) 10 MG tablet Take 1 tablet (10 mg total) by mouth daily. 60 tablet 2  . Fluticasone-Salmeterol (ADVAIR DISKUS) 250-50 MCG/DOSE AEPB Inhale 1 puff into the lungs every 12 (twelve) hours. 60 each 1  . hydrochlorothiazide (HYDRODIURIL) 25 MG tablet Take 1 tablet (25 mg total) by mouth daily. 90 tablet 3  . ibuprofen (ADVIL,MOTRIN) 200 MG tablet Take  400 mg by mouth every 6 (six) hours as needed (pain).     No current facility-administered medications on file prior to visit.     BP 118/72 (BP Location: Left Arm, Patient Position: Sitting, Cuff Size: Normal)   Pulse 94   Temp 98.5 F (36.9 C) (Oral)   Ht 5' (1.524 m)   Wt 176 lb 3.2 oz (79.9 kg)   LMP 01/04/2010   SpO2 98%   BMI 34.41 kg/m     Review of Systems  Constitutional: Positive for fatigue.  HENT: Positive for congestion, postnasal drip and sore throat. Negative for dental problem, hearing loss, rhinorrhea, sinus pressure and tinnitus.   Eyes: Negative for pain, discharge and visual disturbance.  Respiratory: Positive for cough and shortness of breath.   Cardiovascular: Negative for chest pain, palpitations and leg swelling.  Gastrointestinal: Negative for  abdominal distention, abdominal pain, blood in stool, constipation, diarrhea, nausea and vomiting.  Genitourinary: Negative for difficulty urinating, dysuria, flank pain, frequency, hematuria, pelvic pain, urgency, vaginal bleeding, vaginal discharge and vaginal pain.  Musculoskeletal: Negative for arthralgias, gait problem and joint swelling.  Skin: Negative for rash.  Neurological: Negative for dizziness, syncope, speech difficulty, weakness, numbness and headaches.  Hematological: Negative for adenopathy.  Psychiatric/Behavioral: Negative for agitation, behavioral problems and dysphoric mood. The patient is not nervous/anxious.        Objective:   Physical Exam  Constitutional: She is oriented to person, place, and time. She appears well-developed and well-nourished. No distress.  Afebrile Blood pressure well controlled O2 saturation 98% Frequent paroxysms of coughing  HENT:  Head: Normocephalic.  Right Ear: External ear normal.  Left Ear: External ear normal.  Mouth/Throat: Oropharynx is clear and moist.  The left upper lid appeared normal Palpation revealed a very tiny, perhaps 2 mm nodule medially  Eyes: Pupils are equal, round, and reactive to light. Conjunctivae and EOM are normal.  Neck: Normal range of motion. Neck supple. No thyromegaly present.  Cardiovascular: Normal rate, regular rhythm, normal heart sounds and intact distal pulses.   Pulmonary/Chest: Effort normal and breath sounds normal. She has no wheezes.  Abdominal: Soft. Bowel sounds are normal. She exhibits no mass. There is no tenderness.  Musculoskeletal: Normal range of motion.  Lymphadenopathy:    She has no cervical adenopathy.  Neurological: She is alert and oriented to person, place, and time.  Skin: Skin is warm and dry. No rash noted.  Psychiatric: She has a normal mood and affect. Her behavior is normal.          Assessment & Plan:   Viral URI with cough Asthma Essential  hypertension Resolving stye, left upper lid  Will treat symptomatically Samples of Advair, dispensed  Nyoka Cowden

## 2016-08-27 NOTE — Patient Instructions (Addendum)
Acute bronchitis symptoms for less than 10 days are generally not helped by antibiotics.  Take over-the-counter expectorants and cough medications such as  Mucinex DM.  Call if there is no improvement in 5 to 7 days or if  you develop worsening cough, fever, or new symptoms, such as shortness of breath or chest pain.  Hydrate and Humidify  Drink enough water to keep your urine clear or pale yellow. Staying hydrated will help to thin your mucus.  Use a cool mist humidifier to keep the humidity level in your home above 50%.  Inhale steam for 10-15 minutes, 3-4 times a day or as told by your health care provider. You can do this in the bathroom while a hot shower is running.  Limit your exposure to cool or dry air.

## 2016-08-29 ENCOUNTER — Encounter: Payer: Self-pay | Admitting: Internal Medicine

## 2016-08-29 ENCOUNTER — Ambulatory Visit: Payer: BLUE CROSS/BLUE SHIELD

## 2016-08-29 NOTE — Telephone Encounter (Signed)
Spoke with pt and she states that she has increased wheezing, mostly dry cough that is non-productive, feels "feverish" but has not taken her temp. She states that she has been using her Advair 2 puffs twice daily along with Albuterol HFA about 4 times daily. She has tried Mucinex with no improvement. She is requesting antibiotics be sent in to her pharmacy. Pt advised that Dr. Raliegh Ip is not in the office and that this will be sent to another provider for recommendations.   Dr. Yong Channel - Please advise in Dr. Marthann Schiller absence, thanks!

## 2016-08-29 NOTE — Telephone Encounter (Signed)
Attempted to contact pt, her voicemail is full and will not accept new messages. Replied via MyChart asking pt to contact office for triage call.

## 2016-08-29 NOTE — Telephone Encounter (Signed)
Patient advised on recommendations. She would like to wait on advice or rx from Dr. Raliegh Ip when he returns to office tomorrow.  Dr. Raliegh Ip - Please advise. Thanks!

## 2016-08-30 ENCOUNTER — Other Ambulatory Visit: Payer: Self-pay | Admitting: Internal Medicine

## 2016-08-30 MED ORDER — AZITHROMYCIN 250 MG PO TABS: ORAL_TABLET | ORAL | 0 refills | Status: DC

## 2016-08-30 MED ORDER — PREDNISONE 20 MG PO TABS: 20.0000 mg | ORAL_TABLET | Freq: Two times a day (BID) | ORAL | 0 refills | Status: DC

## 2016-09-02 ENCOUNTER — Encounter: Payer: Self-pay | Admitting: Internal Medicine

## 2016-09-02 ENCOUNTER — Ambulatory Visit (INDEPENDENT_AMBULATORY_CARE_PROVIDER_SITE_OTHER): Payer: BLUE CROSS/BLUE SHIELD | Admitting: Internal Medicine

## 2016-09-02 VITALS — BP 168/88 | HR 87 | Temp 98.4°F | Ht 60.0 in | Wt 176.0 lb

## 2016-09-02 DIAGNOSIS — I10 Essential (primary) hypertension: Secondary | ICD-10-CM

## 2016-09-02 DIAGNOSIS — J4551 Severe persistent asthma with (acute) exacerbation: Secondary | ICD-10-CM

## 2016-09-02 MED ORDER — HYDROCODONE-HOMATROPINE 5-1.5 MG/5ML PO SYRP: 5.0000 mL | ORAL_SOLUTION | Freq: Four times a day (QID) | ORAL | 0 refills | Status: DC | PRN

## 2016-09-02 MED ORDER — PREDNISONE 10 MG PO TABS: ORAL_TABLET | ORAL | 0 refills | Status: DC

## 2016-09-02 NOTE — Progress Notes (Signed)
Subjective:    Patient ID: Sandra Freeman, female    DOB: [birth year], 58 y.o.   MRN: [id number]  HPI  56 year old patient who is seen today in follow-up with persistent asthmatic bronchitis. She continues to have occasional wheezing, most marked nocturnally.  She has been treated with azithromycin and presently is on prednisone 20 mg twice a day. Her chief complaint is severe refractory cough.  This interferes with sleep and is associated with some occasional incontinence. She states that she has had refractory asthma in the past and at one point was treated with long-term high-dose steroids.  This resulted in complications such as premature cataracts . Past Medical History:  Diagnosis Date  . ALLERGIC RHINITIS 02/06/2007  . ANEMIA, IRON DEFICIENCY 06/17/2007  . ANXIETY DISORDER 06/02/2007  . ASCUS PAP 04/23/2007  . ASTHMA 02/06/2007  . DEPRESSION 02/06/2007  . DIZZINESS 02/13/2007  . Essential hypertension, benign 04/16/2007  . FATTY LIVER DISEASE 06/17/2007  . Gastroparesis 10/21/2008  . GERD 02/06/2007  . HYPERTENSION 03/18/2008  . HYPOTHYROIDISM 02/06/2007  . LIVER FUNCTION TESTS, ABNORMAL 02/10/2007  . NONSPEC ELEVATION OF LEVELS OF TRANSAMINASE/LDH 06/02/2007  . OBESITY 02/06/2007  . 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 WRIST SPRAIN, RIGHT 05/23/2008     Social History   Social History  . Marital status: Divorced    Spouse name: N/A  . Number of children: 1  . Years of education: 2 years of college   Occupational History  . Occupational hygienist    Social History Main Topics  . Smoking status: Never Smoker  . Smokeless tobacco: Never Used  . Alcohol use Yes     Comment: social   . Drug use: No  . Sexual activity: Not on file   Other Topics Concern  . Not on file   Social History Narrative   Lives at home with finace.   Right-handed.   2-2.5 cups caffeine daily.    Past Surgical History:  Procedure Laterality Date  . ANTERIOR FUSION CERVICAL SPINE    . CATARACT EXTRACTION    .  KNEE ARTHROSCOPY     left  . KNEE SURGERY    . POSTERIOR CERVICAL LAMINECTOMY    . TUBAL LIGATION      Family History  Problem Relation Age of Onset  . Cancer Mother        breast  . Heart disease Mother   . COPD Mother   . Diabetes Father   . Cancer Father        lung  . Diabetes Sister   . Diabetes Brother   . Diabetes Brother     No Known Allergies  Current Outpatient Prescriptions on File Prior to Visit  Medication Sig Dispense Refill  . azithromycin (ZITHROMAX) 250 MG tablet 2 tablets once daily for 3 consecutive days 6 tablet 0  . benzonatate (TESSALON) 200 MG capsule Take 1 capsule (200 mg total) by mouth 2 (two) times daily as needed for cough. 20 capsule 0  . buPROPion (WELLBUTRIN XL) 300 MG 24 hr tablet Take 1 tablet (300 mg total) by mouth daily. 90 tablet 1  . clotrimazole-betamethasone (LOTRISONE) cream APPLY  CREAM TOPICALLY TWICE DAILY 30 g 2  . diazepam (VALIUM) 5 MG tablet TAKE 1 TABLET BY MOUTH EVERY 12 HOURS AS NEEDED FOR ANXIETY 60 tablet 0  . escitalopram (LEXAPRO) 10 MG tablet Take 1 tablet (10 mg total) by mouth daily. 60 tablet 2  . Fluticasone-Salmeterol (ADVAIR DISKUS) 250-50 MCG/DOSE AEPB Inhale 1 puff  into the lungs every 12 (twelve) hours. 60 each 1  . hydrochlorothiazide (HYDRODIURIL) 25 MG tablet Take 1 tablet (25 mg total) by mouth daily. 90 tablet 3  . ibuprofen (ADVIL,MOTRIN) 200 MG tablet Take 400 mg by mouth every 6 (six) hours as needed (pain).    . VENTOLIN HFA 108 (90 Base) MCG/ACT inhaler INHALE TWO PUFFS BY MOUTH EVERY 4 HOURS AS NEEDED FOR WHEEZING 18 each 5   No current facility-administered medications on file prior to visit.     BP (!) 168/88 (BP Location: Left Arm, Patient Position: Sitting, Cuff Size: Normal)   Pulse 87   Temp 98.4 F (36.9 C) (Oral)   Ht 5' (1.524 m)   Wt 176 lb (79.8 kg)   LMP 01/04/2010   SpO2 97%   BMI 34.37 kg/m     Review of Systems  Constitutional: Positive for activity change, appetite change  and fatigue.  HENT: Negative for congestion, dental problem, hearing loss, rhinorrhea, sinus pressure, sore throat and tinnitus.   Eyes: Negative for pain, discharge and visual disturbance.  Respiratory: Positive for cough, shortness of breath and wheezing.   Cardiovascular: Negative for chest pain, palpitations and leg swelling.  Gastrointestinal: Negative for abdominal distention, abdominal pain, blood in stool, constipation, diarrhea, nausea and vomiting.  Genitourinary: Negative for difficulty urinating, dysuria, flank pain, frequency, hematuria, pelvic pain, urgency, vaginal bleeding, vaginal discharge and vaginal pain.  Musculoskeletal: Negative for arthralgias, gait problem and joint swelling.  Skin: Negative for rash.  Neurological: Negative for dizziness, syncope, speech difficulty, weakness, numbness and headaches.  Hematological: Negative for adenopathy.  Psychiatric/Behavioral: Positive for sleep disturbance. Negative for agitation, behavioral problems and dysphoric mood. The patient is not nervous/anxious.        Objective:   Physical Exam  Constitutional: She is oriented to person, place, and time. She appears well-developed and well-nourished. No distress.  No distress Blood pressure 150/80 O2 saturation 97%  Frequent paroxysms of coughing  HENT:  Head: Normocephalic.  Right Ear: External ear normal.  Left Ear: External ear normal.  Mouth/Throat: Oropharynx is clear and moist.  Eyes: Pupils are equal, round, and reactive to light. Conjunctivae and EOM are normal.  Neck: Normal range of motion. Neck supple. No thyromegaly present.  Cardiovascular: Normal rate, regular rhythm, normal heart sounds and intact distal pulses.   Pulmonary/Chest: Effort normal and breath sounds normal. No respiratory distress. She has no wheezes. She has no rales.  Abdominal: Soft. Bowel sounds are normal. She exhibits no mass. There is no tenderness.  Musculoskeletal: Normal range of motion.    Lymphadenopathy:    She has no cervical adenopathy.  Neurological: She is alert and oriented to person, place, and time.  Skin: Skin is warm and dry. No rash noted.  Psychiatric: She has a normal mood and affect. Her behavior is normal.          Assessment & Plan:   Persistent asthma with chronic cough.  Will complete prednisone 20 mg twice a day for 6 days.  Will continue an additional 2 weeks of a slow taper, azithromycin antibiotics completed We'll continue maintenance Advair and albuterol every 6 hours Will treat cough aggressively Force fluids Hydration.  Discussed and encouraged  Nyoka Cowden

## 2016-09-02 NOTE — Patient Instructions (Addendum)
Chronic Bronchitis Chronic bronchitis is a lasting inflammation of the bronchial tubes, which are the tubes that carry air into your lungs. This is inflammation that occurs:  On most days of the week.  For at least three months at a time.  Over a period of two years in a row.  When the bronchial tubes are inflamed, they start to produce mucus. The inflammation and buildup of mucus make it more difficult to breathe. Chronic bronchitis is usually a permanent problem and is one type of chronic obstructive pulmonary disease (COPD). People with chronic bronchitis are at greater risk for getting repeated colds, or respiratory infections. What are the causes? Chronic bronchitis most often occurs in people who have:  Long-standing, severe asthma.  A history of smoking.  Asthma and who also smoke.  What are the signs or symptoms? Chronic bronchitis may cause the following:  A cough that brings up mucus (productive cough).  Shortness of breath.  Early morning headache.  Wheezing.  Chest discomfort.  Recurring respiratory infections.  How is this diagnosed? Your health care provider may confirm the diagnosis by:  Taking your medical history.  Performing a physical exam.  Taking a chest X-ray.  Performing pulmonary function tests.  How is this treated? Treatment involves controlling symptoms with medicines, oxygen therapy, or making lifestyle changes, such as exercising and eating a healthy, well-balanced diet. Medicines could include:  Inhalers to improve air flow in and out of your lungs.  Antibiotics to treat bacterial infections, such as pneumonia, sinus infections, and acute bronchitis.  As a preventative measure, your health care provider may recommend routine vaccinations for influenza and pneumonia. This is to prevent infection and hospitalization since you may be more at risk for these types of infections. Follow these instructions at home:  Take medicines only as  directed by your health care provider.  If you smoke cigarettes, chew tobacco, or use electronic cigarettes, quit. If you need help quitting, ask your health care provider.  Avoid pollen, dust, animal dander, molds, smoke, and other things that cause shortness of breath or wheezing attacks.  Talk to your health care provider about possible exercise routines. Regular exercise is very important to help you feel better.  If you are prescribed oxygen use at home follow these guidelines: ? Never smoke while using oxygen. Oxygen does not burn or explode, but flammable materials will burn faster in the presence of oxygen. ? Keep a Data processing manager close by. Let your fire department know that you have oxygen in your home. ? Warn visitors not to smoke near you when you are using oxygen. Put up "no smoking" signs in your home where you most often use the oxygen. ? Regularly test your smoke detectors at home to make sure they work. If you receive care in your home from a nurse or other health care provider, he or she may also check to make sure your smoke detectors work.  Ask your health care provider whether you would benefit from a pulmonary rehabilitation program.  Do not wait to get medical care if you have any concerning symptoms. Delays could cause permanent injury and may be life threatening. Contact a health care provider if:  You have increased coughing or shortness of breath or both.  You have muscle aches.  You have chest pain.  Your mucus gets thicker.  Your mucus changes from clear or white to yellow, green, gray, or bloody. Get help right away if:  Your usual medicines do not stop  your wheezing.  You have increased difficulty breathing.  You have any problems with the medicine you are taking, such as a rash, itching, swelling, or trouble breathing. This information is not intended to replace advice given to you by your health care provider. Make sure you discuss any questions  you have with your health care provider.    Hydrate and Humidify  Drink enough water to keep your urine clear or pale yellow. Staying hydrated will help to thin your mucus.  Use a cool mist humidifier to keep the humidity level in your home above 50%.  Inhale steam for 10-15 minutes, 3-4 times a day or as told by your health care provider. You can do this in the bathroom while a hot shower is running.  Limit your exposure to cool or dry air. Rest  Rest as much as possible.

## 2016-09-04 ENCOUNTER — Other Ambulatory Visit: Payer: Self-pay | Admitting: Internal Medicine

## 2016-09-04 MED ORDER — ALBUTEROL SULFATE HFA 108 (90 BASE) MCG/ACT IN AERS: INHALATION_SPRAY | RESPIRATORY_TRACT | 5 refills | Status: DC

## 2016-09-09 ENCOUNTER — Encounter: Payer: Self-pay | Admitting: Internal Medicine

## 2016-09-09 ENCOUNTER — Other Ambulatory Visit: Payer: Self-pay | Admitting: Internal Medicine

## 2016-09-09 MED ORDER — HYDROCODONE-HOMATROPINE 5-1.5 MG/5ML PO SYRP: 5.0000 mL | ORAL_SOLUTION | Freq: Four times a day (QID) | ORAL | 0 refills | Status: DC | PRN
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
## 2016-09-10 ENCOUNTER — Telehealth: Payer: Self-pay | Admitting: Internal Medicine

## 2016-09-10 NOTE — Telephone Encounter (Signed)
Pt states she received mychart message from Dr Raliegh Ip to pick up a Rx for hydromet. Pt will be here about 1:30 today to pick up this Rx.

## 2016-09-10 NOTE — Telephone Encounter (Signed)
Rx is at the front desk awaiting pick up

## 2016-09-16 ENCOUNTER — Encounter: Payer: Self-pay | Admitting: Internal Medicine

## 2016-09-16 ENCOUNTER — Telehealth: Payer: Self-pay | Admitting: Internal Medicine

## 2016-09-16 DIAGNOSIS — I10 Essential (primary) hypertension: Secondary | ICD-10-CM

## 2016-09-16 NOTE — Telephone Encounter (Signed)
What labs should I order? Please advise.

## 2016-09-16 NOTE — Telephone Encounter (Signed)
° ° ° °  Pt call to say Dr Raliegh Ip sent her a email and told her to come in and have a lab draw but there are no orders in. Place order and I will call pt to schedule

## 2016-09-16 NOTE — Telephone Encounter (Signed)
CPX, labs

## 2016-09-17 NOTE — Telephone Encounter (Signed)
CPE orders placed in epic

## 2016-09-17 NOTE — Telephone Encounter (Signed)
Pt has been sch for cpx labs on 09/19/16. Please put order in

## 2016-09-19 ENCOUNTER — Other Ambulatory Visit (INDEPENDENT_AMBULATORY_CARE_PROVIDER_SITE_OTHER): Payer: BLUE CROSS/BLUE SHIELD

## 2016-09-19 DIAGNOSIS — I10 Essential (primary) hypertension: Secondary | ICD-10-CM

## 2016-09-19 LAB — COMPREHENSIVE METABOLIC PANEL
ALT: 27 U/L (ref 0–35)
AST: 24 U/L (ref 0–37)
Albumin: 4 g/dL (ref 3.5–5.2)
Alkaline Phosphatase: 52 U/L (ref 39–117)
BUN: 11 mg/dL (ref 6–23)
CALCIUM: 9.2 mg/dL (ref 8.4–10.5)
CHLORIDE: 103 meq/L (ref 96–112)
CO2: 29 meq/L (ref 19–32)
Creatinine, Ser: 0.88 mg/dL (ref 0.40–1.20)
GFR: 70.02 mL/min (ref >60.00)
GLUCOSE: 89 mg/dL (ref 70–99)
POTASSIUM: 3.5 meq/L (ref 3.5–5.1)
Sodium: 141 mEq/L (ref 135–145)
Total Bilirubin: 0.8 mg/dL (ref 0.2–1.2)
Total Protein: 6.2 g/dL (ref 6.0–8.3)

## 2016-09-19 LAB — POC URINALSYSI DIPSTICK (AUTOMATED)
Bilirubin, UA: NEGATIVE
GLUCOSE UA: NEGATIVE
KETONES UA: NEGATIVE
Nitrite, UA: NEGATIVE
Protein, UA: NEGATIVE
RBC UA: NEGATIVE
SPEC GRAV UA: 1.02 (ref 1.010–1.025)
UROBILINOGEN UA: 0.2 U/dL
pH, UA: 6 (ref 5.0–8.0)

## 2016-09-19 LAB — LIPID PANEL
CHOL/HDL RATIO: 3
CHOLESTEROL: 212 mg/dL — AB (ref 0–200)
HDL: 66.2 mg/dL (ref >39.00)
LDL CALC: 123 mg/dL — AB (ref 0–99)
NonHDL: 146.14
Triglycerides: 116 mg/dL (ref 0.0–149.0)
VLDL: 23.2 mg/dL (ref 0.0–40.0)

## 2016-09-19 LAB — CBC
HCT: 42.4 % (ref 36.0–46.0)
HEMOGLOBIN: 14.4 g/dL (ref 12.0–15.0)
MCHC: 34 g/dL (ref 30.0–36.0)
MCV: 92.9 fl (ref 78.0–100.0)
PLATELETS: 148 10*3/uL — AB (ref 150.0–400.0)
RBC: 4.57 Mil/uL (ref 3.87–5.11)
RDW: 13 % (ref 11.5–15.5)
WBC: 5 10*3/uL (ref 4.0–10.5)

## 2016-09-19 LAB — HEMOGLOBIN A1C: HEMOGLOBIN A1C: 4.9 % (ref 4.6–6.5)

## 2016-09-19 LAB — VITAMIN B12: Vitamin B-12: 312 pg/mL (ref 211–911)

## 2016-09-19 LAB — TSH: TSH: 1.74 u[IU]/mL (ref 0.35–4.50)

## 2016-09-30 ENCOUNTER — Encounter: Payer: Self-pay | Admitting: Internal Medicine

## 2016-09-30 ENCOUNTER — Other Ambulatory Visit: Payer: Self-pay | Admitting: Internal Medicine

## 2016-09-30 MED ORDER — MONTELUKAST SODIUM 10 MG PO TABS: 10.0000 mg | ORAL_TABLET | Freq: Every day | ORAL | 3 refills | Status: DC

## 2016-10-01 ENCOUNTER — Other Ambulatory Visit: Payer: Self-pay | Admitting: Internal Medicine

## 2016-10-01 ENCOUNTER — Telehealth: Payer: Self-pay | Admitting: Internal Medicine

## 2016-10-01 NOTE — Telephone Encounter (Signed)
Pt request refill   HYDROcodone-homatropine (HYCODAN) 5-1.5 MG/5ML syrup  albuterol (VENTOLIN HFA) 108 (90 Base) MCG/ACT inhaler benzonatate (TESSALON) 200 MG capsule Fluticasone-Salmeterol (ADVAIR DISKUS) 250-50 MCG/DOSE AEPB   Pt states all for her asthma  [place], [place] - [address]

## 2016-10-02 MED ORDER — ALBUTEROL SULFATE HFA 108 (90 BASE) MCG/ACT IN AERS: INHALATION_SPRAY | RESPIRATORY_TRACT | 5 refills | Status: DC

## 2016-10-02 MED ORDER — FLUTICASONE-SALMETEROL 250-50 MCG/DOSE IN AEPB: 1.0000 | INHALATION_SPRAY | Freq: Two times a day (BID) | RESPIRATORY_TRACT | 5 refills | Status: DC

## 2016-10-02 NOTE — Telephone Encounter (Signed)
HYDROcodone-homatropine (HYCODAN) 5-1.5 MG/5ML syrup was last refilled on: 09/09/16   Ok to refill   Please advise

## 2016-10-04 MED ORDER — HYDROCODONE-HOMATROPINE 5-1.5 MG/5ML PO SYRP: 5.0000 mL | ORAL_SOLUTION | Freq: Four times a day (QID) | ORAL | 0 refills | Status: DC | PRN

## 2016-10-04 NOTE — Addendum Note (Signed)
Addended by: Abelardo Diesel on: 10/04/2016 09:09 AM   Modules accepted: Orders

## 2016-10-04 NOTE — Telephone Encounter (Signed)
Left message on voicemail to call office, Rx is ready for pickup. Rx printed and signed.

## 2016-10-04 NOTE — Telephone Encounter (Signed)
Okay for refill?  

## 2016-10-04 NOTE — Telephone Encounter (Signed)
Rx printed awaiting to be signed.

## 2016-10-10 ENCOUNTER — Encounter: Payer: Self-pay | Admitting: Internal Medicine

## 2016-11-06 DIAGNOSIS — F4323 Adjustment disorder with mixed anxiety and depressed mood: Secondary | ICD-10-CM | POA: Diagnosis not present

## 2016-11-11 ENCOUNTER — Other Ambulatory Visit: Payer: Self-pay | Admitting: Internal Medicine

## 2016-11-20 ENCOUNTER — Other Ambulatory Visit: Payer: Self-pay | Admitting: Internal Medicine

## 2016-11-20 ENCOUNTER — Telehealth: Payer: Self-pay | Admitting: Internal Medicine

## 2016-11-20 ENCOUNTER — Encounter: Payer: Self-pay | Admitting: Internal Medicine

## 2016-11-20 MED ORDER — ESCITALOPRAM OXALATE 20 MG PO TABS: 20.0000 mg | ORAL_TABLET | Freq: Every day | ORAL | 3 refills | Status: DC

## 2016-11-20 NOTE — Telephone Encounter (Signed)
Pt was called and made aware that there is a work note waiting to be picked up at the front desk.

## 2017-01-16 ENCOUNTER — Encounter: Payer: Self-pay | Admitting: Internal Medicine

## 2017-01-16 ENCOUNTER — Telehealth: Payer: Self-pay | Admitting: Family Medicine

## 2017-01-16 NOTE — Telephone Encounter (Signed)
A script for hycodan syrup dated on 10/04/2016 was discarded today.

## 2017-01-20 ENCOUNTER — Other Ambulatory Visit: Payer: Self-pay | Admitting: Internal Medicine

## 2017-01-20 MED ORDER — PREDNISONE 10 MG PO TABS: ORAL_TABLET | ORAL | 0 refills | Status: DC

## 2017-01-20 MED ORDER — PREDNISONE 10 MG PO TABS: 10.0000 mg | ORAL_TABLET | Freq: Two times a day (BID) | ORAL | 0 refills | Status: DC

## 2017-01-20 NOTE — Telephone Encounter (Signed)
Relation to [facility] Call back number: work # [phone] ext [phone]    Reason for call:  Patient checking on the status of my chart message, please advise

## 2017-01-23 ENCOUNTER — Ambulatory Visit: Payer: BLUE CROSS/BLUE SHIELD | Admitting: Family Medicine

## 2017-02-21 ENCOUNTER — Encounter: Payer: Self-pay | Admitting: Adult Health

## 2017-02-21 ENCOUNTER — Ambulatory Visit: Payer: BLUE CROSS/BLUE SHIELD | Admitting: Adult Health

## 2017-02-21 VITALS — BP 136/70 | HR 96 | Temp 98.0°F | Ht 60.0 in | Wt 192.0 lb

## 2017-02-21 DIAGNOSIS — M79642 Pain in left hand: Secondary | ICD-10-CM | POA: Diagnosis not present

## 2017-02-21 DIAGNOSIS — M79641 Pain in right hand: Secondary | ICD-10-CM | POA: Diagnosis not present

## 2017-02-21 DIAGNOSIS — T148XXA Other injury of unspecified body region, initial encounter: Secondary | ICD-10-CM

## 2017-02-21 LAB — CBC WITH DIFFERENTIAL/PLATELET
BASOS PCT: 0.9 % (ref 0.0–3.0)
Basophils Absolute: 0.1 10*3/uL (ref 0.0–0.1)
EOS ABS: 0.3 10*3/uL (ref 0.0–0.7)
Eosinophils Relative: 4.6 % (ref 0.0–5.0)
HEMATOCRIT: 41.7 % (ref 36.0–46.0)
HEMOGLOBIN: 14.3 g/dL (ref 12.0–15.0)
LYMPHS PCT: 34.1 % (ref 12.0–46.0)
Lymphs Abs: 2 10*3/uL (ref 0.7–4.0)
MCHC: 34.2 g/dL (ref 30.0–36.0)
MCV: 88.5 fl (ref 78.0–100.0)
Monocytes Absolute: 0.4 10*3/uL (ref 0.1–1.0)
Monocytes Relative: 6.8 % (ref 3.0–12.0)
Neutro Abs: 3.2 10*3/uL (ref 1.4–7.7)
Neutrophils Relative %: 53.6 % (ref 43.0–77.0)
Platelets: 208 10*3/uL (ref 150.0–400.0)
RBC: 4.71 Mil/uL (ref 3.87–5.11)
RDW: 13.3 % (ref 11.5–15.5)
WBC: 5.9 10*3/uL (ref 4.0–10.5)

## 2017-02-21 LAB — IBC PANEL
Iron: 62 ug/dL (ref 42–145)
SATURATION RATIOS: 15.2 % — AB (ref 20.0–50.0)
Transferrin: 291 mg/dL (ref 212.0–360.0)

## 2017-02-21 LAB — BASIC METABOLIC PANEL
BUN: 16 mg/dL (ref 6–23)
CHLORIDE: 104 meq/L (ref 96–112)
CO2: 29 meq/L (ref 19–32)
Calcium: 9.9 mg/dL (ref 8.4–10.5)
Creatinine, Ser: 0.87 mg/dL (ref 0.40–1.20)
GFR: 70.85 mL/min (ref >60.00)
GLUCOSE: 101 mg/dL — AB (ref 70–99)
POTASSIUM: 4.1 meq/L (ref 3.5–5.1)
SODIUM: 142 meq/L (ref 135–145)

## 2017-02-21 LAB — C-REACTIVE PROTEIN: CRP: 0.4 mg/dL — ABNORMAL LOW (ref 0.5–20.0)

## 2017-02-21 LAB — SEDIMENTATION RATE: Sed Rate: 3 mm/hr (ref 0–30)

## 2017-02-21 MED ORDER — DOXYCYCLINE HYCLATE 100 MG PO CAPS: 100.0000 mg | ORAL_CAPSULE | Freq: Two times a day (BID) | ORAL | 0 refills | Status: DC

## 2017-02-21 NOTE — Progress Notes (Signed)
Subjective:    Patient ID: Sandra Freeman, female    DOB: Apr 18, 1960, 57 y.o.   MRN: [id number]  HPI  57 year old female who  has a past medical history of ALLERGIC RHINITIS (02/06/2007), ANEMIA, IRON DEFICIENCY (06/17/2007), ANXIETY DISORDER (06/02/2007), ASCUS PAP (04/23/2007), ASTHMA (02/06/2007), DEPRESSION (02/06/2007), DIZZINESS (02/13/2007), Essential hypertension, benign (04/16/2007), FATTY LIVER DISEASE (06/17/2007), Gastroparesis (10/21/2008), GERD (02/06/2007), HYPERTENSION (03/18/2008), HYPOTHYROIDISM (02/06/2007), LIVER FUNCTION TESTS, ABNORMAL (02/10/2007), NONSPEC ELEVATION OF LEVELS OF TRANSAMINASE/LDH (06/02/2007), OBESITY (02/06/2007), Telogen effluvium (01/26/2009), and WRIST SPRAIN, RIGHT (05/23/2008).  She presents to the office today with the initial complaint of lacerations to bilateral hands. Reports over the last week and a half she's found that when she bumps her hands into any hard objects such as a dresser or kitchen table that the trauma causes skin tears. She has also noticed that she is bruising more easily than she is accustomed to. She does have a history of anemia and reports that bruising easy is nothing new to her she just concerned because she feels it's happening more often. Additionally she reports that over the last week and a half she's felt a tightness in bilateral hands and forearms that she cannot explain. She has noticed decreased grip strength.   She denies any chest pain, shortness of breath, blurred vision, headaches, numbness and tingling, or tremors.   Review of Systems See HPI   Past Medical History:  Diagnosis Date  . ALLERGIC RHINITIS 02/06/2007  . ANEMIA, IRON DEFICIENCY 06/17/2007  . ANXIETY DISORDER 06/02/2007  . ASCUS PAP 04/23/2007  . ASTHMA 02/06/2007  . DEPRESSION 02/06/2007  . DIZZINESS 02/13/2007  . Essential hypertension, benign 04/16/2007  . FATTY LIVER DISEASE 06/17/2007  . Gastroparesis 10/21/2008  . GERD 02/06/2007  . HYPERTENSION 03/18/2008  .  HYPOTHYROIDISM 02/06/2007  . LIVER FUNCTION TESTS, ABNORMAL 02/10/2007  . NONSPEC ELEVATION OF LEVELS OF TRANSAMINASE/LDH 06/02/2007  . OBESITY 02/06/2007  . 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 WRIST SPRAIN, RIGHT 05/23/2008    Social History   Socioeconomic History  . Marital status: Divorced    Spouse name: Not on file  . Number of children: 1  . Years of education: 2 years of college  . Highest education level: Not on file  Social Needs  . Financial resource strain: Not on file  . Food insecurity - worry: Not on file  . Food insecurity - inability: Not on file  . Transportation needs - medical: Not on file  . Transportation needs - non-medical: Not on file  Occupational History  . Occupation: Occupational hygienist  Tobacco Use  . Smoking status: Never Smoker  . Smokeless tobacco: Never Used  Substance and Sexual Activity  . Alcohol use: Yes    Comment: social   . Drug use: No  . Sexual activity: Not on file  Other Topics Concern  . Not on file  Social History Narrative   Lives at home with finace.   Right-handed.   2-2.5 cups caffeine daily.    Past Surgical History:  Procedure Laterality Date  . ANTERIOR FUSION CERVICAL SPINE    . CATARACT EXTRACTION    . KNEE ARTHROSCOPY     left  . KNEE SURGERY    . POSTERIOR CERVICAL LAMINECTOMY    . TUBAL LIGATION      Family History  Problem Relation Age of Onset  . Cancer Mother        breast  . Heart disease Mother   . COPD Mother   .  Diabetes Father   . Cancer Father        lung  . Diabetes Sister   . Diabetes Brother   . Diabetes Brother     No Known Allergies  Current Outpatient Medications on File Prior to Visit  Medication Sig Dispense Refill  . albuterol (VENTOLIN HFA) 108 (90 Base) MCG/ACT inhaler INHALE TWO PUFFS BY MOUTH EVERY 4 HOURS AS NEEDED FOR WHEEZING 60 g 5  . buPROPion (WELLBUTRIN XL) 300 MG 24 hr tablet Take 1 tablet (300 mg total) by mouth daily. 90 tablet 1  . clotrimazole-betamethasone (LOTRISONE)  cream APPLY  CREAM TOPICALLY TWICE DAILY 30 g 2  . cyclobenzaprine (FLEXERIL) 10 MG tablet TAKE ONE TABLET BY MOUTH THREE TIMES DAILY 30 tablet 1  . diazepam (VALIUM) 5 MG tablet TAKE 1 TABLET BY MOUTH EVERY 12 HOURS AS NEEDED FOR ANXIETY 60 tablet 0  . escitalopram (LEXAPRO) 20 MG tablet Take 1 tablet (20 mg total) by mouth daily. 90 tablet 3  . Fluticasone-Salmeterol (ADVAIR DISKUS) 250-50 MCG/DOSE AEPB Inhale 1 puff into the lungs every 12 (twelve) hours. 60 each 5  . hydrochlorothiazide (HYDRODIURIL) 25 MG tablet Take 1 tablet (25 mg total) by mouth daily. 90 tablet 3  . HYDROcodone-homatropine (HYCODAN) 5-1.5 MG/5ML syrup Take 5 mLs by mouth every 6 (six) hours as needed for cough. 120 mL 0  . ibuprofen (ADVIL,MOTRIN) 200 MG tablet Take 400 mg by mouth every 6 (six) hours as needed (pain).    . montelukast (SINGULAIR) 10 MG tablet Take 1 tablet (10 mg total) by mouth at bedtime. 60 tablet 3  . predniSONE (DELTASONE) 10 MG tablet Take 1 tablet (10 mg total) by mouth 2 (two) times daily with a meal. 20 tablet 0   No current facility-administered medications on file prior to visit.     BP 136/70 (BP Location: Left Arm, Patient Position: Sitting, Cuff Size: Large)   Pulse 96   Temp 98 F (36.7 C) (Oral)   Ht 5' (1.524 m)   Wt 192 lb (87.1 kg)   LMP 01/04/2010   SpO2 98%   BMI 37.50 kg/m       Objective:   Physical Exam  Constitutional: She is oriented to person, place, and time. She appears well-developed and well-nourished. No distress.  Cardiovascular: Normal rate, regular rhythm, normal heart sounds and intact distal pulses. Exam reveals no gallop and no friction rub.  No murmur heard. Pulmonary/Chest: Effort normal and breath sounds normal. No respiratory distress. She has no wheezes. She has no rales. She exhibits no tenderness.  Neurological: She is alert and oriented to person, place, and time.  Skin: Skin is warm and dry. She is not diaphoretic.  Multiple small skin tears  on dorsal aspect of bilateral hands some of the skin tears appear with localized erythema. There is no drainage from wounds noted. She also does have multiple bruises, in various stages of healing on dorsal aspect of bilateral hands and forearms  Psychiatric: She has a normal mood and affect. Her behavior is normal. Judgment and thought content normal.  Nursing note and vitals reviewed.     Assessment & Plan:  Unknown cause of her symptoms at this time. She has been on prednisone therapy in the past which could be contributing to localize skin tears of the hands. Possible arthritic changes could be causing her discomfort. Will get CBC to make sure she is not anemic as well as check BMP, and a, C-reactive, and sedimentation rate. Consider  referral to rheumatologist and/or imaging in the future. We'll prescribe doxycycline 100 mg twice a day for localize infection. Advised that she could take Motrin or Tylenol for symptom relief. - Follow up with PCP  Dorothyann Peng, NP

## 2017-02-24 LAB — ANA: ANA: NEGATIVE

## 2017-02-25 ENCOUNTER — Encounter: Payer: Self-pay | Admitting: Adult Health

## 2017-02-25 ENCOUNTER — Other Ambulatory Visit: Payer: Self-pay | Admitting: Adult Health

## 2017-02-25 MED ORDER — TRAMADOL HCL 50 MG PO TABS: 50.0000 mg | ORAL_TABLET | Freq: Three times a day (TID) | ORAL | 0 refills | Status: DC | PRN

## 2017-02-25 NOTE — Telephone Encounter (Signed)
Pt called in, she said that her hands are still hurting, she is having swelling. Pt would like to know what she should do going forward?    Please advise.    CB: [phone] ext [phone] (work) OR [phone] (mobile)

## 2017-03-06 ENCOUNTER — Ambulatory Visit: Payer: BLUE CROSS/BLUE SHIELD | Admitting: Internal Medicine

## 2017-03-06 ENCOUNTER — Encounter: Payer: Self-pay | Admitting: Internal Medicine

## 2017-03-06 VITALS — BP 110/78 | HR 75 | Temp 98.3°F | Ht 60.0 in | Wt 187.0 lb

## 2017-03-06 DIAGNOSIS — M79641 Pain in right hand: Secondary | ICD-10-CM | POA: Diagnosis not present

## 2017-03-06 DIAGNOSIS — I10 Essential (primary) hypertension: Secondary | ICD-10-CM

## 2017-03-06 DIAGNOSIS — M79642 Pain in left hand: Secondary | ICD-10-CM

## 2017-03-06 MED ORDER — FUROSEMIDE 20 MG PO TABS: 20.0000 mg | ORAL_TABLET | Freq: Every day | ORAL | 3 refills | Status: DC

## 2017-03-06 NOTE — Progress Notes (Signed)
Subjective:    Patient ID: Sandra Freeman, female    DOB: 1960-02-04, 57 y.o.   MRN: [id number]  HPI  57 year old patient who presents today complaining of pain and swelling involving the hands.  Symptoms started about 1 month ago.  She had the initial onset of pain and swelling involving the left fifth finger they gradually involved the entire left hand and then the right hand. She was seen in the clinic earlier and laboratory screen was obtained that revealed negative inflammatory markers.  She denies any real early morning stiffness.  The pain and swelling seems to worsen throughout the day and is minimal in the morning.  She works in the office at a keyboard.  No real change in her activities. Last year she was treated with prednisone for asthmatic bronchitis and later flare of low back pain. She has noticed some easy bruisability involving the dorsal aspects of the hands and lower arms No prednisone therapy in over 2 months.  Denies any systemic symptoms She does have a history of arthritis but apparently does not take hydrochlorothiazide;  blood pressure today was in the low normal range.  Past Medical History:  Diagnosis Date  . ALLERGIC RHINITIS 02/06/2007  . ANEMIA, IRON DEFICIENCY 06/17/2007  . ANXIETY DISORDER 06/02/2007  . ASCUS PAP 04/23/2007  . ASTHMA 02/06/2007  . DEPRESSION 02/06/2007  . DIZZINESS 02/13/2007  . Essential hypertension, benign 04/16/2007  . FATTY LIVER DISEASE 06/17/2007  . Gastroparesis 10/21/2008  . GERD 02/06/2007  . HYPERTENSION 03/18/2008  . HYPOTHYROIDISM 02/06/2007  . LIVER FUNCTION TESTS, ABNORMAL 02/10/2007  . NONSPEC ELEVATION OF LEVELS OF TRANSAMINASE/LDH 06/02/2007  . OBESITY 02/06/2007  . 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 WRIST SPRAIN, RIGHT 05/23/2008     Social History   Socioeconomic History  . Marital status: Divorced    Spouse name: Not on file  . Number of children: 1  . Years of education: 2 years of college  . Highest education level: Not on  file  Social Needs  . Financial resource strain: Not on file  . Food insecurity - worry: Not on file  . Food insecurity - inability: Not on file  . Transportation needs - medical: Not on file  . Transportation needs - non-medical: Not on file  Occupational History  . Occupation: Occupational hygienist  Tobacco Use  . Smoking status: Never Smoker  . Smokeless tobacco: Never Used  Substance and Sexual Activity  . Alcohol use: Yes    Comment: social   . Drug use: No  . Sexual activity: Not on file  Other Topics Concern  . Not on file  Social History Narrative   Lives at home with finace.   Right-handed.   2-2.5 cups caffeine daily.    Past Surgical History:  Procedure Laterality Date  . ANTERIOR FUSION CERVICAL SPINE    . CATARACT EXTRACTION    . KNEE ARTHROSCOPY     left  . KNEE SURGERY    . POSTERIOR CERVICAL LAMINECTOMY    . TUBAL LIGATION      Family History  Problem Relation Age of Onset  . Cancer Mother        breast  . Heart disease Mother   . COPD Mother   . Diabetes Father   . Cancer Father        lung  . Diabetes Sister   . Diabetes Brother   . Diabetes Brother     No Known Allergies  Current Outpatient Medications on  File Prior to Visit  Medication Sig Dispense Refill  . buPROPion (WELLBUTRIN XL) 300 MG 24 hr tablet Take 1 tablet (300 mg total) by mouth daily. 90 tablet 1  . escitalopram (LEXAPRO) 20 MG tablet Take 1 tablet (20 mg total) by mouth daily. 90 tablet 3  . Fluticasone-Salmeterol (ADVAIR DISKUS) 250-50 MCG/DOSE AEPB Inhale 1 puff into the lungs every 12 (twelve) hours. 60 each 5  . traMADol (ULTRAM) 50 MG tablet Take 1 tablet (50 mg total) by mouth every 8 (eight) hours as needed. 15 tablet 0   No current facility-administered medications on file prior to visit.     BP 110/78 (BP Location: Left Arm, Patient Position: Sitting, Cuff Size: Large)   Pulse 75   Temp 98.3 F (36.8 C) (Oral)   Ht 5' (1.524 m)   Wt 187 lb (84.8 kg)   LMP  01/04/2010   SpO2 97%   BMI 36.52 kg/m     Review of Systems  Constitutional: Negative.  Negative for activity change, appetite change, fatigue and fever.  HENT: Negative for congestion, dental problem, hearing loss, rhinorrhea, sinus pressure, sore throat and tinnitus.   Eyes: Negative for pain, discharge and visual disturbance.  Respiratory: Negative for cough and shortness of breath.   Cardiovascular: Negative for chest pain, palpitations and leg swelling.  Gastrointestinal: Negative for abdominal distention, abdominal pain, blood in stool, constipation, diarrhea, nausea and vomiting.  Genitourinary: Negative for difficulty urinating, dysuria, flank pain, frequency, hematuria, pelvic pain, urgency, vaginal bleeding, vaginal discharge and vaginal pain.  Musculoskeletal: Positive for arthralgias. Negative for gait problem and joint swelling.  Skin: Negative for rash.  Neurological: Negative for dizziness, syncope, speech difficulty, weakness, numbness and headaches.  Hematological: Negative for adenopathy.  Psychiatric/Behavioral: Negative for agitation, behavioral problems and dysphoric mood. The patient is not nervous/anxious.        Objective:   Physical Exam  Constitutional: She appears well-developed and well-nourished. No distress.  Musculoskeletal: She exhibits edema.  Soft tissue swelling of both hands most marked over the MCP joints and dorsal aspects of the hands No erythema or joint tenderness Patient a difficult time making a full grip due to the soft tissue swelling and pain No wrist pain or decreased range of motion  Skin:  Easy bruisability and some small skin tears involving the dorsal aspects of both hands and distal arms           Assessment & Plan:   1 month history of bilateral hand swelling and discomfort.  No constitutional complaints Easy bruisability.  Possible related to earlier prednisone use History of hypertension.  Blood pressure low normal  off medication  Patient has been asked to take naproxen twice daily Attempt to minimize overuse activities Cold compresses following overuse and for acute pain Furosemide 20 mg daily  Rheumatology referral if unimproved  Nyoka Cowden

## 2017-03-06 NOTE — Patient Instructions (Signed)
  Naproxen 1 tablet twice daily  Furosemide 20 mg daily  Cold compresses after overuse  Call for rheumatology consultation if unimproved

## 2017-03-10 ENCOUNTER — Encounter: Payer: Self-pay | Admitting: Internal Medicine

## 2017-03-10 ENCOUNTER — Other Ambulatory Visit: Payer: Self-pay | Admitting: Internal Medicine

## 2017-03-10 ENCOUNTER — Telehealth: Payer: Self-pay | Admitting: Family Medicine

## 2017-03-10 DIAGNOSIS — M79641 Pain in right hand: Secondary | ICD-10-CM

## 2017-03-10 DIAGNOSIS — M79642 Pain in left hand: Principal | ICD-10-CM

## 2017-03-10 DIAGNOSIS — R252 Cramp and spasm: Secondary | ICD-10-CM

## 2017-03-10 NOTE — Telephone Encounter (Signed)
Copied from [place] [id number]. Topic: Quick Communication - See Telephone Encounter >> Mar 10, 2017  8:40 AM Burnis Medin, NT wrote: CRM for notification. See Telephone encounter for: Patient called and wanted to see doctor or nurse could give her a call back regarding a referral.  03/10/17.   Pt was recently seen here in office for hand pain and swelling. Pt requesting a referral now for Ortho as soon as possible.

## 2017-03-12 DIAGNOSIS — M79641 Pain in right hand: Secondary | ICD-10-CM | POA: Diagnosis not present

## 2017-03-12 NOTE — Telephone Encounter (Signed)
Spoke with patient and she wants a STAT appointment for Rheumatologist. 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 Patient states her hands are swollen and painful.

## 2017-03-13 ENCOUNTER — Other Ambulatory Visit: Payer: Self-pay | Admitting: Internal Medicine

## 2017-03-13 MED ORDER — PREDNISONE 20 MG PO TABS: 20.0000 mg | ORAL_TABLET | Freq: Every day | ORAL | 0 refills | Status: DC

## 2017-03-13 NOTE — Telephone Encounter (Signed)
Schedule orthopedic evaluation.  They may be able to see patient sooner

## 2017-03-13 NOTE — Telephone Encounter (Signed)
Referral sent to ortho

## 2017-03-13 NOTE — Telephone Encounter (Signed)
Called patient to inform her of the referral placed for orthopedic. Patient stated that she has seen an orthopedic doctor yesterday and he sent her to Rheumatologist.
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
## 2017-03-18 ENCOUNTER — Encounter: Payer: Self-pay | Admitting: Rheumatology

## 2017-03-18 ENCOUNTER — Ambulatory Visit (INDEPENDENT_AMBULATORY_CARE_PROVIDER_SITE_OTHER): Payer: Self-pay

## 2017-03-18 ENCOUNTER — Ambulatory Visit: Payer: BLUE CROSS/BLUE SHIELD | Admitting: Rheumatology

## 2017-03-18 VITALS — BP 142/83 | HR 71 | Resp 16 | Ht 60.0 in | Wt 188.0 lb

## 2017-03-18 DIAGNOSIS — M5030 Other cervical disc degeneration, unspecified cervical region: Secondary | ICD-10-CM | POA: Diagnosis not present

## 2017-03-18 DIAGNOSIS — Z8679 Personal history of other diseases of the circulatory system: Secondary | ICD-10-CM

## 2017-03-18 DIAGNOSIS — M5136 Other intervertebral disc degeneration, lumbar region: Secondary | ICD-10-CM

## 2017-03-18 DIAGNOSIS — K760 Fatty (change of) liver, not elsewhere classified: Secondary | ICD-10-CM | POA: Diagnosis not present

## 2017-03-18 DIAGNOSIS — Z8639 Personal history of other endocrine, nutritional and metabolic disease: Secondary | ICD-10-CM | POA: Diagnosis not present

## 2017-03-18 DIAGNOSIS — K3184 Gastroparesis: Secondary | ICD-10-CM | POA: Diagnosis not present

## 2017-03-18 DIAGNOSIS — Z8659 Personal history of other mental and behavioral disorders: Secondary | ICD-10-CM | POA: Diagnosis not present

## 2017-03-18 DIAGNOSIS — Z862 Personal history of diseases of the blood and blood-forming organs and certain disorders involving the immune mechanism: Secondary | ICD-10-CM

## 2017-03-18 DIAGNOSIS — Z8709 Personal history of other diseases of the respiratory system: Secondary | ICD-10-CM

## 2017-03-18 DIAGNOSIS — Z8719 Personal history of other diseases of the digestive system: Secondary | ICD-10-CM | POA: Diagnosis not present

## 2017-03-18 DIAGNOSIS — M79641 Pain in right hand: Secondary | ICD-10-CM

## 2017-03-18 DIAGNOSIS — M79642 Pain in left hand: Secondary | ICD-10-CM | POA: Diagnosis not present

## 2017-03-18 DIAGNOSIS — R5383 Other fatigue: Secondary | ICD-10-CM

## 2017-03-19 ENCOUNTER — Ambulatory Visit: Payer: BLUE CROSS/BLUE SHIELD | Admitting: Rheumatology

## 2017-03-19 NOTE — Progress Notes (Signed)
TSH is high.  Please forward labs to her PCP.

## 2017-03-21 LAB — RHEUMATOID FACTOR

## 2017-03-21 LAB — URINALYSIS, ROUTINE W REFLEX MICROSCOPIC
BACTERIA UA: NONE SEEN /HPF
BILIRUBIN URINE: NEGATIVE
Glucose, UA: NEGATIVE
Hgb urine dipstick: NEGATIVE
Hyaline Cast: NONE SEEN /LPF
Nitrite: NEGATIVE
PH: 5.5 (ref 5.0–8.0)
Protein, ur: NEGATIVE
SPECIFIC GRAVITY, URINE: 1.028 (ref 1.001–1.03)

## 2017-03-21 LAB — TSH: TSH: 4.55 mIU/L — ABNORMAL HIGH (ref 0.40–4.50)

## 2017-03-21 LAB — ALT: ALT: 26 U/L (ref 6–29)

## 2017-03-21 LAB — AST: AST: 15 U/L (ref 10–35)

## 2017-03-21 LAB — ANGIOTENSIN CONVERTING ENZYME: Angiotensin-Converting Enzyme: 44 U/L (ref 9–67)

## 2017-03-21 LAB — CK: Total CK: 20 U/L — ABNORMAL LOW (ref 29–143)

## 2017-03-21 LAB — CYCLIC CITRUL PEPTIDE ANTIBODY, IGG

## 2017-03-21 LAB — HLA-B27 ANTIGEN: HLA-B27 ANTIGEN: NEGATIVE

## 2017-03-21 LAB — URIC ACID: Uric Acid, Serum: 5.8 mg/dL (ref 2.5–7.0)

## 2017-03-21 LAB — 14-3-3 ETA PROTEIN

## 2017-03-24 ENCOUNTER — Encounter: Payer: Self-pay | Admitting: Internal Medicine

## 2017-03-24 NOTE — Telephone Encounter (Signed)
Dr. Sherren Mocha, please advise in PCP absence. Should patient schedule appointment for next week w/ PCP or wait for Dr. Marthann Schiller advice when returns?

## 2017-03-26 ENCOUNTER — Ambulatory Visit: Payer: BLUE CROSS/BLUE SHIELD | Admitting: Rheumatology

## 2017-03-26 ENCOUNTER — Ambulatory Visit (INDEPENDENT_AMBULATORY_CARE_PROVIDER_SITE_OTHER): Payer: Self-pay

## 2017-03-26 DIAGNOSIS — M79641 Pain in right hand: Secondary | ICD-10-CM

## 2017-03-26 DIAGNOSIS — M79642 Pain in left hand: Secondary | ICD-10-CM | POA: Diagnosis not present

## 2017-03-26 NOTE — Progress Notes (Signed)
Ultrasound examination of bilateral hands was performed per EULAR recommendations. Using 12 MHz transducer, grayscale and power Doppler bilateral second, third, and fifth MCP joints and bilateral wrist joints both dorsal and volar aspects were evaluated to look for synovitis or tenosynovitis. The findings were there was no synovitis or tenosynovitis on ultrasound examination. Right median nerve was 0.13 cm squares which was more than upper limits and left median nerve was 0.12 cm squares which was in the upper limits of normal.  Impression: Ultrasound examination did not show any synovitis .  Bilateral median nerves were in the upper limits of normal.  Patient had no symptoms of carpal tunnel syndrome. Bo Merino, MD

## 2017-03-28 DIAGNOSIS — M5136 Other intervertebral disc degeneration, lumbar region: Secondary | ICD-10-CM | POA: Insufficient documentation

## 2017-03-28 DIAGNOSIS — M5030 Other cervical disc degeneration, unspecified cervical region: Secondary | ICD-10-CM | POA: Insufficient documentation

## 2017-03-28 NOTE — Progress Notes (Signed)
Office Visit Note  Patient: Sandra Freeman             Date of Birth: 08/22/1960           MRN: [id number]             PCP: Marletta Lor, MD Referring: Marletta Lor, MD Visit Date: 04/07/2017 Occupation: @GUAROCC @    Subjective:  Pain in right ankle.   History of Present Illness: Sandra Freeman is a 57 y.o. female with history of osteoarthritis.  According to her she has been having increased pain and stiffness in her hands.  She is also noticed that some of her fingers trigger.  She has noticed it in her left second and fifth finger.  She also has discomfort in her right ankle recently.  He continues to have some limitation and discomfort in her C-spine.  She has discomfort in the lower back due to underlying disc disease.  Activities of Daily Living:  Patient reports morning stiffness for all  day.   Patient Reports nocturnal pain.  Difficulty dressing/grooming: Reports Difficulty climbing stairs: Denies Difficulty getting out of chair: Denies Difficulty using hands for taps, buttons, cutlery, and/or writing: Reports   Review of Systems  Constitutional: Positive for fatigue. Negative for night sweats, weight gain, weight loss and weakness.  HENT: Positive for mouth dryness. Negative for mouth sores, trouble swallowing, trouble swallowing and nose dryness.   Eyes: Positive for dryness. Negative for pain, redness and visual disturbance.  Respiratory: Negative for cough, shortness of breath and difficulty breathing.   Cardiovascular: Negative for chest pain, palpitations, hypertension, irregular heartbeat and swelling in legs/feet.  Gastrointestinal: Negative for abdominal pain, blood in stool, constipation and diarrhea.  Endocrine: Negative for increased urination.  Genitourinary: Negative for pelvic pain and vaginal dryness.  Musculoskeletal: Positive for arthralgias, joint pain and morning stiffness. Negative for joint swelling, myalgias, muscle weakness,  muscle tenderness and myalgias.  Skin: Negative for color change, rash, hair loss, redness, skin tightness, ulcers and sensitivity to sunlight.  Allergic/Immunologic: Negative for susceptible to infections.  Neurological: Negative for dizziness, numbness, headaches, memory loss and night sweats.  Hematological: Negative for bruising/bleeding tendency and swollen glands.  Psychiatric/Behavioral: Negative for depressed mood and sleep disturbance. The patient is nervous/anxious.     PMFS History:  Patient Active Problem List   Diagnosis Date Noted  . DDD (degenerative disc disease), cervical 03/28/2017  . DDD (degenerative disc disease), lumbar 03/28/2017  . Paresthesia 12/05/2015  . TELOGEN EFFLUVIUM 01/26/2009  . GASTROPARESIS 10/21/2008  . Essential hypertension 03/18/2008  . ANEMIA, IRON DEFICIENCY 06/17/2007  . FATTY LIVER DISEASE 06/17/2007  . Anxiety state 06/02/2007  . ASCUS PAP 04/23/2007  . LIVER FUNCTION TESTS, ABNORMAL 02/10/2007  . OBESITY 02/06/2007  . DEPRESSION 02/06/2007  . Allergic rhinitis 02/06/2007  . Asthma 02/06/2007  . GERD 02/06/2007    Past Medical History:  Diagnosis Date  . ALLERGIC RHINITIS 02/06/2007  . ANEMIA, IRON DEFICIENCY 06/17/2007  . ANXIETY DISORDER 06/02/2007  . ASCUS PAP 04/23/2007  . ASTHMA 02/06/2007  . DEPRESSION 02/06/2007  . DIZZINESS 02/13/2007  . Essential hypertension, benign 04/16/2007  . FATTY LIVER DISEASE 06/17/2007  . Gastroparesis 10/21/2008  . GERD 02/06/2007  . HYPERTENSION 03/18/2008  . HYPOTHYROIDISM 02/06/2007  . LIVER FUNCTION TESTS, ABNORMAL 02/10/2007  . NONSPEC ELEVATION OF LEVELS OF TRANSAMINASE/LDH 06/02/2007  . OBESITY 02/06/2007  . 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 WRIST SPRAIN, RIGHT 05/23/2008    Family History  Problem Relation  Age of Onset  . Cancer Mother        breast  . Heart disease Mother   . COPD Mother   . Diabetes Father   . Cancer Father        lung  . Diabetes Sister   . Diabetes Brother   . Diabetes  Brother    Past Surgical History:  Procedure Laterality Date  . ANTERIOR FUSION CERVICAL SPINE    . CATARACT EXTRACTION    . KNEE ARTHROSCOPY     left  . KNEE SURGERY    . POSTERIOR CERVICAL LAMINECTOMY    . TUBAL LIGATION     Social History   Social History Narrative   Lives at home with finace.   Right-handed.   2-2.5 cups caffeine daily.     Objective: Vital Signs: BP 137/83 (BP Location: Left Arm, Patient Position: Sitting, Cuff Size: Normal)   Pulse 60   Resp 15   Ht 5' (1.524 m)   Wt 188 lb (85.3 kg)   LMP 01/04/2010   BMI 36.72 kg/m    Physical Exam  Constitutional: She is oriented to person, place, and time. She appears well-developed and well-nourished.  HENT:  Head: Normocephalic and atraumatic.  Eyes: Conjunctivae and EOM are normal.  Neck: Normal range of motion.  Cardiovascular: Normal rate, regular rhythm, normal heart sounds and intact distal pulses.  Pulmonary/Chest: Effort normal and breath sounds normal.  Abdominal: Soft. Bowel sounds are normal.  Lymphadenopathy:    She has no cervical adenopathy.  Neurological: She is alert and oriented to person, place, and time.  Skin: Skin is warm and dry. Capillary refill takes less than 2 seconds.  Psychiatric: She has a normal mood and affect. Her behavior is normal.  Nursing note and vitals reviewed.    Musculoskeletal Exam: C-spine thoracic lumbar spine limited range of motion with discomfort.  Shoulder joints and elbow joints are good range of motion.  She has bilateral PIP and DIP thickening with no synovitis.  Hip joints knee joints and ankle joints are good range of motion with no synovitis.  She has some discomfort on palpation of her right ankle.  CDAI Exam: No CDAI exam completed.    Investigation: No additional findings. CBC Latest Ref Rng & Units 02/21/2017 09/19/2016 06/28/2014  WBC 4.0 - 10.5 K/uL 5.9 5.0 6.0  Hemoglobin 12.0 - 15.0 g/dL 14.3 14.4 14.1  Hematocrit 36.0 - 46.0 % 41.7 42.4  41.3  Platelets 150.0 - 400.0 K/uL 208.0 148.0(L) 169.0   CMP Normal, UA positive for calcium oxalate crystals CK normal, TSH 4.55 elevated, uric acid 5.8, ACE 44, RF negative, anti-CCP negative, 14 3 3  eta negative, HLA-B27 negative  Imaging: [place] Extrem Up Bilat Comp  Result Date: 03/26/2017 Ultrasound examination of bilateral hands was performed per EULAR recommendations. Using 12 MHz transducer, grayscale and power Doppler bilateral second, third, and fifth MCP joints and bilateral wrist joints both dorsal and volar aspects were evaluated to look for synovitis or tenosynovitis. The findings were there was no synovitis or tenosynovitis on ultrasound examination. Right median nerve was 0.13 cm squares which was more than upper limits and left median nerve was 0.12 cm squares which was in the upper limits of normal. Impression: Ultrasound examination did not show any synovitis .  Bilateral median nerves were in the upper limits of normal.  Patient had no symptoms of carpal tunnel syndrome.  Xr Hand 2 View Left  Result Date: 03/18/2017 PIP and DIP narrowing was  noted.  CMC narrowing was noted.  No MCP joint narrowing was noted.  No intercarpal radiocarpal joint space narrowing was noted.  No erosive changes were noted. Impression: These findings are consistent with osteoarthritis of the hand.  Xr Hand 2 View Right  Result Date: 03/18/2017 PIP and DIP narrowing was noted.  CMC narrowing was noted.  No MCP joint narrowing was noted.  No intercarpal radiocarpal joint space narrowing was noted.  No erosive changes were noted. Impression: These findings are consistent with osteoarthritis of the hand.   Speciality Comments: No specialty comments available.    Procedures:  No procedures performed Allergies: Patient has no known allergies.   Assessment / Plan:     Visit Diagnoses: Primary osteoarthritis of both hands - Ultrasound bilateral hands on March 26 2017 was negative for synovitis.  All  autoimmune workup negative.  We had detailed discussion regarding osteoarthritis.  Left findings and ultrasound findings were discussed.  She is off prednisone now and has no synovitis on examination.  In my opinion she has osteoarthritis.  Handout on head muscle strengthening exercises was given.  A list of natural anti-inflammatories was discussed.  I have also given her topical Voltaren gel we will see response to that.  Side effects were discussed.  Chronic pain of right ankle: She had no synovitis on examination.  DDD (degenerative disc disease), cervical: Chronic pain  DDD (degenerative disc disease), lumbar: Chronic pain  Anxiety state  Essential hypertension: Her blood pressure is mildly elevated.  Have advised her to monitor it closely.  History of gastroesophageal reflux (GERD)   History of anemia  History of obesity: Weight loss diet and exercise was discussed.  Gastroparesis  Fatty liver  History of asthma   Orders: No orders of the defined types were placed in this encounter.  Meds ordered this encounter  Medications  . diclofenac sodium (VOLTAREN) 1 % GEL    Sig: Apply 3 gm to 3 large joints up to 3 times a day.Dispense 3 tubes with 3 refills.    Dispense:  3 Tube    Refill:  1    Face-to-face time spent with patient was 30 minutes.  Greater than 50% of time was spent in counseling and coordination of care.  Follow-Up Instructions: Return in about 6 months (around 10/08/2017) for Osteoarthritis.   Bo Merino, MD  Note - This record has been created using Editor, commissioning.  Chart creation errors have been sought, but may not always  have been located. Such creation errors do not reflect on  the standard of medical care.

## 2017-04-07 ENCOUNTER — Encounter: Payer: Self-pay | Admitting: Rheumatology

## 2017-04-07 ENCOUNTER — Ambulatory Visit: Payer: BLUE CROSS/BLUE SHIELD | Admitting: Rheumatology

## 2017-04-07 VITALS — BP 137/83 | HR 60 | Resp 15 | Ht 60.0 in | Wt 188.0 lb

## 2017-04-07 DIAGNOSIS — M5136 Other intervertebral disc degeneration, lumbar region: Secondary | ICD-10-CM

## 2017-04-07 DIAGNOSIS — M19041 Primary osteoarthritis, right hand: Secondary | ICD-10-CM

## 2017-04-07 DIAGNOSIS — F411 Generalized anxiety disorder: Secondary | ICD-10-CM

## 2017-04-07 DIAGNOSIS — G8929 Other chronic pain: Secondary | ICD-10-CM | POA: Diagnosis not present

## 2017-04-07 DIAGNOSIS — M19042 Primary osteoarthritis, left hand: Secondary | ICD-10-CM

## 2017-04-07 DIAGNOSIS — I10 Essential (primary) hypertension: Secondary | ICD-10-CM | POA: Diagnosis not present

## 2017-04-07 DIAGNOSIS — Z8719 Personal history of other diseases of the digestive system: Secondary | ICD-10-CM

## 2017-04-07 DIAGNOSIS — M25571 Pain in right ankle and joints of right foot: Secondary | ICD-10-CM

## 2017-04-07 DIAGNOSIS — M5030 Other cervical disc degeneration, unspecified cervical region: Secondary | ICD-10-CM | POA: Diagnosis not present

## 2017-04-07 MED ORDER — DICLOFENAC SODIUM 1 % TD GEL: TRANSDERMAL | 1 refills | Status: DC

## 2017-04-07 NOTE — Patient Instructions (Signed)
Natural anti-inflammatories  You can purchase these at [employer], [employer] or online.  . Turmeric (capsules)  . Ginger (ginger root or capsules)  . Omega 3 (Fish, flax seeds, chia seeds, walnuts, almonds)  . Tart cherry (dried or extract)   Patient should be under the care of a physician while taking these supplements. This may not be reproduced without the permission of Dr. Bo Merino.   Hand Exercises Hand exercises can be helpful to almost anyone. These exercises can strengthen the hands, improve flexibility and movement, and increase blood flow to the hands. These results can make work and daily tasks easier. Hand exercises can be especially helpful for people who have joint pain from arthritis or have nerve damage from overuse (carpal tunnel syndrome). These exercises can also help people who have injured a hand. Most of these hand exercises are fairly gentle stretching routines. You can do them often throughout the day. Still, it is a good idea to ask your health care provider which exercises would be best for you. Warming your hands before exercise may help to reduce stiffness. You can do this with gentle massage or by placing your hands in warm water for 15 minutes. Also, make sure you pay attention to your level of hand pain as you begin an exercise routine. Exercises Knuckle Bend Repeat this exercise 5-10 times with each hand. 1. Stand or sit with your arm, hand, and all five fingers pointed straight up. Make sure your wrist is straight. 2. Gently and slowly bend your fingers down and inward until the tips of your fingers are touching the tops of your palm. 3. Hold this position for a few seconds. 4. Extend your fingers out to their original position, all pointing straight up again.  Finger Fan Repeat this exercise 5-10 times with each hand. 1. Hold your arm and hand out in front of you. Keep your wrist straight. 2. Squeeze your hand into a fist. 3. Hold this  position for a few seconds. 4. Edison Simon out, or spread apart, your hand and fingers as much as possible, stretching every joint fully.  Tabletop Repeat this exercise 5-10 times with each hand. 1. Stand or sit with your arm, hand, and all five fingers pointed straight up. Make sure your wrist is straight. 2. Gently and slowly bend your fingers at the knuckles where they meet the hand until your hand is making an upside-down L shape. Your fingers should form a tabletop. 3. Hold this position for a few seconds. 4. Extend your fingers out to their original position, all pointing straight up again.  Making Os Repeat this exercise 5-10 times with each hand. 1. Stand or sit with your arm, hand, and all five fingers pointed straight up. Make sure your wrist is straight. 2. Make an O shape by touching your pointer finger to your thumb. Hold for a few seconds. Then open your hand wide. 3. Repeat this motion with each finger on your hand.  Table Spread Repeat this exercise 5-10 times with each hand. 1. Place your hand on a table with your palm facing down. Make sure your wrist is straight. 2. Spread your fingers out as much as possible. Hold this position for a few seconds. 3. Slide your fingers back together again. Hold for a few seconds.  Ball Grip  Repeat this exercise 10-15 times with each hand. 1. Hold a tennis ball or another soft ball in your hand. 2. While slowly increasing pressure, squeeze the ball as  hard as possible. 3. Squeeze as hard as you can for 3-5 seconds. 4. Relax and repeat.  Wrist Curls Repeat this exercise 10-15 times with each hand. 1. Sit in a chair that has armrests. 2. Hold a light weight in your hand, such as a dumbbell that weighs 1-3 pounds (0.5-1.4 kg). Ask your health care provider what weight would be best for you. 3. Rest your hand just over the end of the chair arm with your palm facing up. 4. Gently pivot your wrist up and down while holding the weight. Do not  twist your wrist from side to side.  Contact a health care provider if:  Your hand pain or discomfort gets much worse when you do an exercise.  Your hand pain or discomfort does not improve within 2 hours after you exercise. If you have any of these problems, stop doing these exercises right away. Do not do them again unless your health care provider says that you can. Get help right away if:  You develop sudden, severe hand pain. If this happens, stop doing these exercises right away. Do not do them again unless your health care provider says that you can. This information is not intended to replace advice given to you by your health care provider. Make sure you discuss any questions you have with your health care provider. Document Released: 12/19/2014 Document Revised: 06/15/2015 Document Reviewed: 07/18/2014 Elsevier Interactive Patient Education  [employer].

## 2017-05-05 ENCOUNTER — Encounter: Payer: Self-pay | Admitting: Gastroenterology

## 2017-05-13 ENCOUNTER — Other Ambulatory Visit: Payer: Self-pay | Admitting: Internal Medicine

## 2017-05-28 DIAGNOSIS — F4323 Adjustment disorder with mixed anxiety and depressed mood: Secondary | ICD-10-CM | POA: Diagnosis not present

## 2017-06-20 ENCOUNTER — Other Ambulatory Visit: Payer: Self-pay | Admitting: Internal Medicine

## 2017-06-25 ENCOUNTER — Telehealth: Payer: Self-pay | Admitting: Family Medicine

## 2017-06-25 DIAGNOSIS — F4323 Adjustment disorder with mixed anxiety and depressed mood: Secondary | ICD-10-CM | POA: Diagnosis not present

## 2017-06-25 NOTE — Telephone Encounter (Signed)
Okay for orders? Please advise

## 2017-06-25 NOTE — Telephone Encounter (Signed)
Copied from [place] [phone]. Topic: Appointment Scheduling - Scheduling Inquiry for Clinic >> Jun 25, 2017  3:30 PM Synthia Innocent wrote: Reason for CRM: Requesting TSH order, see mychart message from 03/24/2017. If she does not answer may leave detail message on work phone with appt info.

## 2017-06-26 NOTE — Telephone Encounter (Signed)
ok 

## 2017-06-27 ENCOUNTER — Other Ambulatory Visit: Payer: Self-pay

## 2017-06-27 ENCOUNTER — Telehealth: Payer: Self-pay | Admitting: Rheumatology

## 2017-06-27 DIAGNOSIS — R7989 Other specified abnormal findings of blood chemistry: Secondary | ICD-10-CM

## 2017-06-27 NOTE — Telephone Encounter (Signed)
Patient still having a lot of problems with her hands, and wants to know if there is anything else she can do to help before her rov in Sept. Does she need to follow up before then? Please call to advise.

## 2017-06-27 NOTE — Telephone Encounter (Signed)
Called pt informing her of the update for TSH. Vm was full.

## 2017-06-30 ENCOUNTER — Telehealth: Payer: Self-pay | Admitting: Rheumatology

## 2017-06-30 NOTE — Telephone Encounter (Signed)
Patient called stating she was returning your call. Please call back at her work # [phone] x [phone]

## 2017-06-30 NOTE — Telephone Encounter (Signed)
Patient states she is having pain in her hand and they are locking up. Patient states it is worse on the left than the right. Patient states she is having swelling. Patient states the 3rd finger on the left hand is is having shooting pain. Patient has been scheduled for 07/01/17 at 9:15 am

## 2017-06-30 NOTE — Telephone Encounter (Signed)
Attempted to contact the patient and left message for patient to call the office.  

## 2017-06-30 NOTE — Telephone Encounter (Signed)
Pt spoke to someone Friday and was told she could come in today. Pt didn't have time to stop by so she will try to come 07/01/2017
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
## 2017-07-01 ENCOUNTER — Other Ambulatory Visit (INDEPENDENT_AMBULATORY_CARE_PROVIDER_SITE_OTHER): Payer: BLUE CROSS/BLUE SHIELD

## 2017-07-01 ENCOUNTER — Encounter: Payer: Self-pay | Admitting: Rheumatology

## 2017-07-01 ENCOUNTER — Ambulatory Visit: Payer: BLUE CROSS/BLUE SHIELD | Admitting: Rheumatology

## 2017-07-01 ENCOUNTER — Ambulatory Visit: Payer: Self-pay | Admitting: Rheumatology

## 2017-07-01 VITALS — BP 138/87 | HR 67 | Ht 60.0 in | Wt 188.0 lb

## 2017-07-01 DIAGNOSIS — R7989 Other specified abnormal findings of blood chemistry: Secondary | ICD-10-CM | POA: Diagnosis not present

## 2017-07-01 DIAGNOSIS — K760 Fatty (change of) liver, not elsewhere classified: Secondary | ICD-10-CM

## 2017-07-01 DIAGNOSIS — K3184 Gastroparesis: Secondary | ICD-10-CM | POA: Diagnosis not present

## 2017-07-01 DIAGNOSIS — Z862 Personal history of diseases of the blood and blood-forming organs and certain disorders involving the immune mechanism: Secondary | ICD-10-CM

## 2017-07-01 DIAGNOSIS — M79642 Pain in left hand: Secondary | ICD-10-CM

## 2017-07-01 DIAGNOSIS — M5030 Other cervical disc degeneration, unspecified cervical region: Secondary | ICD-10-CM

## 2017-07-01 DIAGNOSIS — Z8639 Personal history of other endocrine, nutritional and metabolic disease: Secondary | ICD-10-CM

## 2017-07-01 DIAGNOSIS — M19041 Primary osteoarthritis, right hand: Secondary | ICD-10-CM

## 2017-07-01 DIAGNOSIS — Z8679 Personal history of other diseases of the circulatory system: Secondary | ICD-10-CM | POA: Diagnosis not present

## 2017-07-01 DIAGNOSIS — M19042 Primary osteoarthritis, left hand: Secondary | ICD-10-CM | POA: Diagnosis not present

## 2017-07-01 DIAGNOSIS — M5136 Other intervertebral disc degeneration, lumbar region: Secondary | ICD-10-CM | POA: Diagnosis not present

## 2017-07-01 DIAGNOSIS — Z8659 Personal history of other mental and behavioral disorders: Secondary | ICD-10-CM

## 2017-07-01 DIAGNOSIS — Z8719 Personal history of other diseases of the digestive system: Secondary | ICD-10-CM | POA: Diagnosis not present

## 2017-07-01 LAB — TSH: TSH: 2.4 u[IU]/mL (ref 0.35–4.50)

## 2017-07-01 NOTE — Addendum Note (Signed)
Addended by: Earnestine Mealing on: 07/01/2017 05:02 PM   Modules accepted: Orders

## 2017-07-02 ENCOUNTER — Encounter: Payer: Self-pay | Admitting: Internal Medicine

## 2017-07-08 ENCOUNTER — Telehealth (INDEPENDENT_AMBULATORY_CARE_PROVIDER_SITE_OTHER): Payer: Self-pay | Admitting: *Deleted

## 2017-07-08 NOTE — Telephone Encounter (Signed)
Pt is scheduled at [facility] for MRI on Thurs June 20 at 7:00pm, pt needs to arrive at 630pm to register. Left message on vm to return call for appt information.

## 2017-07-10 ENCOUNTER — Ambulatory Visit (HOSPITAL_COMMUNITY): Admission: RE | Admit: 2017-07-10 | Payer: BLUE CROSS/BLUE SHIELD | Source: Ambulatory Visit

## 2017-07-14 ENCOUNTER — Other Ambulatory Visit: Payer: Self-pay | Admitting: Internal Medicine

## 2017-08-13 DIAGNOSIS — F4323 Adjustment disorder with mixed anxiety and depressed mood: Secondary | ICD-10-CM | POA: Diagnosis not present

## 2017-09-05 ENCOUNTER — Ambulatory Visit: Payer: Self-pay | Admitting: Internal Medicine

## 2017-09-05 NOTE — Telephone Encounter (Signed)
Noted on MyChart message at 10:23 AM, that she rec'd antibiotic order from Dr. Tarri Glenn.  Per note, pt. requested to disregard previous request for antibiotic.

## 2017-09-05 NOTE — Telephone Encounter (Signed)
Message from Bea Graff, NT sent at 09/05/2017 9:18 AM EDT   Summary: abcess tooth   Pt calling and states she has an abcess tooth and her dentist is closed today. She is requesting to speak to a triage nurse to see if a antibiotic and pain meds can be called in. Pt made aware that in order to get an antibiotic the doctor most of the time requests for you to come in for an appt. She still would like to speak with triage before making the appt.          Attempted to call pt. Re: abscess tooth; left voice message to call back.

## 2017-09-18 ENCOUNTER — Ambulatory Visit: Payer: BLUE CROSS/BLUE SHIELD | Admitting: Internal Medicine

## 2017-09-18 ENCOUNTER — Encounter: Payer: Self-pay | Admitting: Internal Medicine

## 2017-09-18 ENCOUNTER — Ambulatory Visit (HOSPITAL_COMMUNITY)
Admission: RE | Admit: 2017-09-18 | Discharge: 2017-09-18 | Disposition: A | Discharge status: Home / Self Care | Payer: BLUE CROSS/BLUE SHIELD | Source: Ambulatory Visit | Attending: Internal Medicine | Admitting: Internal Medicine

## 2017-09-18 VITALS — BP 122/70 | HR 74 | Temp 98.5°F | Wt 188.8 lb

## 2017-09-18 DIAGNOSIS — K3184 Gastroparesis: Secondary | ICD-10-CM

## 2017-09-18 DIAGNOSIS — R1011 Right upper quadrant pain: Secondary | ICD-10-CM | POA: Diagnosis not present

## 2017-09-18 MED ORDER — PANTOPRAZOLE SODIUM 40 MG PO TBEC: 40.0000 mg | DELAYED_RELEASE_TABLET | Freq: Every day | ORAL | 3 refills | Status: DC

## 2017-09-18 MED ORDER — METOCLOPRAMIDE HCL 10 MG PO TABS: 10.0000 mg | ORAL_TABLET | Freq: Three times a day (TID) | ORAL | 0 refills | Status: DC

## 2017-09-18 NOTE — Progress Notes (Signed)
Subjective:    Patient ID: Sandra Freeman, female    DOB: November 16, 1960, 57 y.o.   MRN: [id number]  HPI [age]-year-old patient who was stable until 5 days ago when she first noted some bloating.  This worsened 4 days ago and was associated with some discomfort.  Early Monday morning she had an episode of nausea vomiting and began having more significant epigastric and right upper quadrant pain.  There is also some pain in the right posterior back area.  She has persistent nausea bloating and right upper quadrant and epigastric tenderness.  No recent episodes of nausea or vomiting normal bowel movement yesterday.  No fever or other constitutional complaints. Mother had gallbladder disease Patient states that she had a history of peptic ulcer disease in her teenage years History of gastroparesis Patient did have an abdominal CT scan in 2010 did reveal gastric distention but otherwise unremarkable. In 2006 patient had colonoscopy as well as EGD  Past Medical History:  Diagnosis Date  . ALLERGIC RHINITIS 02/06/2007  . ANEMIA, IRON DEFICIENCY 06/17/2007  . ANXIETY DISORDER 06/02/2007  . ASCUS PAP 04/23/2007  . ASTHMA 02/06/2007  . DEPRESSION 02/06/2007  . DIZZINESS 02/13/2007  . Essential hypertension, benign 04/16/2007  . FATTY LIVER DISEASE 06/17/2007  . Gastroparesis 10/21/2008  . GERD 02/06/2007  . HYPERTENSION 03/18/2008  . HYPOTHYROIDISM 02/06/2007  . LIVER FUNCTION TESTS, ABNORMAL 02/10/2007  . NONSPEC ELEVATION OF LEVELS OF TRANSAMINASE/LDH 06/02/2007  . OBESITY 02/06/2007  . 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 WRIST SPRAIN, RIGHT 05/23/2008     Social History   Socioeconomic History  . Marital status: Divorced    Spouse name: Not on file  . Number of children: 1  . Years of education: 2 years of college  . Highest education level: Not on file  Occupational History  . Occupation: Occupational hygienist  Social Needs  . Financial resource strain: Not on file  . Food insecurity:    Worry: Not on file    Inability: Not on file  . Transportation needs:    Medical: Not on file    Non-medical: Not on file  Tobacco Use  . Smoking status: Never Smoker  . Smokeless tobacco: Never Used  Substance and Sexual Activity  . Alcohol use: Yes    Comment: social/ rare   . Drug use: No  . Sexual activity: Not on file  Lifestyle  . Physical activity:    Days per week: Not on file    Minutes per session: Not on file  . Stress: Not on file  Relationships  . Social connections:    Talks on phone: Not on file    Gets together: Not on file    Attends religious service: Not on file    Active member of club or organization: Not on file    Attends meetings of clubs or organizations: Not on file    Relationship status: Not on file  . Intimate partner violence:    Fear of current or ex partner: Not on file    Emotionally abused: Not on file    Physically abused: Not on file    Forced sexual activity: Not on file  Other Topics Concern  . Not on file  Social History Narrative   Lives at home with finace.   Right-handed.   2-2.5 cups caffeine daily.    Past Surgical History:  Procedure Laterality Date  . ANTERIOR FUSION CERVICAL SPINE    . CATARACT EXTRACTION    .  KNEE ARTHROSCOPY     left  . KNEE SURGERY    . POSTERIOR CERVICAL LAMINECTOMY    . TUBAL LIGATION      Family History  Problem Relation Age of Onset  . Cancer Mother        breast  . Heart disease Mother   . COPD Mother   . Diabetes Father   . Cancer Father        lung  . Diabetes Sister   . Diabetes Brother   . Diabetes Brother     No Known Allergies  Current Outpatient Medications on File Prior to Visit  Medication Sig Dispense Refill  . ALPRAZolam (XANAX) 0.25 MG tablet TAKE ONE TO TWO TABLETS BY MOUTH ONCE DAILY AS NEEDED 60 tablet 2  . Ascorbic Acid (VITAMIN C PO) Take by mouth daily.    . benzonatate (TESSALON) 200 MG capsule TAKE 1 CAPSULE BY MOUTH TWICE DAILY AS NEEDED FOR COUGH 20 capsule 0  . buPROPion  (WELLBUTRIN XL) 300 MG 24 hr tablet Take 1 tablet (300 mg total) by mouth daily. 90 tablet 1  . buPROPion (WELLBUTRIN XL) 300 MG 24 hr tablet TAKE 1 TABLET BY MOUTH ONCE DAILY 90 tablet 1  . diazepam (VALIUM) 5 MG tablet TAKE 1 TABLET BY MOUTH EVERY 12 HOURS AS NEEDED FOR ANXIETY 60 tablet 0  . diclofenac sodium (VOLTAREN) 1 % GEL Apply 3 gm to 3 large joints up to 3 times a day.Dispense 3 tubes with 3 refills. 3 Tube 1  . Fluticasone-Salmeterol (ADVAIR DISKUS) 250-50 MCG/DOSE AEPB Inhale 1 puff into the lungs every 12 (twelve) hours. (Patient taking differently: Inhale 1 puff into the lungs as needed. ) 60 each 5  . Multiple Vitamins-Minerals (MULTIVITAMIN ADULT PO) Take by mouth daily.     No current facility-administered medications on file prior to visit.     BP 122/70 (BP Location: Left Arm, Patient Position: Sitting, Cuff Size: Normal)   Pulse 74   Temp 98.5 F (36.9 C) (Oral)   Wt 188 lb 12.8 oz (85.6 kg)   LMP 01/04/2010   SpO2 96%   BMI 36.87 kg/m      Review of Systems  Constitutional: Negative.   HENT: Negative for congestion, dental problem, hearing loss, rhinorrhea, sinus pressure, sore throat and tinnitus.   Eyes: Negative for pain, discharge and visual disturbance.  Respiratory: Negative for cough and shortness of breath.   Cardiovascular: Negative for chest pain, palpitations and leg swelling.  Gastrointestinal: Positive for abdominal distention, abdominal pain, nausea and vomiting. Negative for blood in stool, constipation and diarrhea.  Genitourinary: Negative for difficulty urinating, dysuria, flank pain, frequency, hematuria, pelvic pain, urgency, vaginal bleeding, vaginal discharge and vaginal pain.  Musculoskeletal: Negative for arthralgias, gait problem and joint swelling.  Skin: Negative for rash.  Neurological: Negative for dizziness, syncope, speech difficulty, weakness, numbness and headaches.  Hematological: Negative for adenopathy.    Psychiatric/Behavioral: Negative for agitation, behavioral problems and dysphoric mood. The patient is not nervous/anxious.        Objective:   Physical Exam  Constitutional: She is oriented to person, place, and time. She appears well-developed and well-nourished. No distress.  HENT:  Head: Normocephalic.  Right Ear: External ear normal.  Left Ear: External ear normal.  Mouth/Throat: Oropharynx is clear and moist.  Eyes: Pupils are equal, round, and reactive to light. Conjunctivae and EOM are normal.  Neck: Normal range of motion. Neck supple. No thyromegaly present.  Cardiovascular: Normal rate, regular rhythm,  normal heart sounds and intact distal pulses.  Pulmonary/Chest: Effort normal and breath sounds normal.  Abdominal: Soft. Bowel sounds are normal. She exhibits no mass. There is tenderness.  Normal bowel sounds Right upper quadrant and epigastric tenderness   Musculoskeletal: Normal range of motion.  Lymphadenopathy:    She has no cervical adenopathy.  Neurological: She is alert and oriented to person, place, and time.  Skin: Skin is warm and dry. No rash noted.  Psychiatric: She has a normal mood and affect. Her behavior is normal.          Assessment & Plan:   Probable symptomatic cholelithiasis.  Will check abdominal ultrasound in the morning History of gastroparesis.  Will place on PPI therapy and metoclopramide  Patient report any clinical worsening ED evaluation if worsening pain fever or intractable vomiting  Marletta Lor

## 2017-09-18 NOTE — Patient Instructions (Addendum)
Gallbladder ultrasound in the morning Clear liquid diet  Report any new or worsening symptoms  Report to the emergency department if you develop worsening pain fever or intractable vomiting Cholecystitis Cholecystitis is swelling and irritation (inflammation) of the gallbladder. The gallbladder is an organ that is shaped like a pear. It is under the liver on the right side of the body. This condition is often caused by gallstones. You doctor may do tests to see how your gallbladder works. These tests may include:  Imaging tests, such as: ? An ultrasound. ? MRI.  Tests that check how your liver works.  This condition needs treatment. Follow these instructions at home: Home care will depend on your treatment. In general:  Take over-the-counter and prescription medicines only as told by your doctor.  If you were prescribed an antibiotic medicine, take it as told by your doctor. Do not stop taking the antibiotic even if you start to feel better.  Follow instructions from your doctor about what to eat or drink. When you are allowed to eat, avoid eating or drinking anything that causes your symptoms to start.  Keep all follow-up visits as told by your doctor. This is important.  Contact a doctor if:  You have pain and your medicine does not help.  You have a fever. Get help right away if:  Your pain moves to: ? Another part of your belly (abdomen). ? Your back.  Your symptoms do not go away.  You have new symptoms. This information is not intended to replace advice given to you by your health care provider. Make sure you discuss any questions you have with your health care provider. Document Released: 12/27/2010 Document Revised: 06/15/2015 Document Reviewed: 04/20/2014 Elsevier Interactive Patient Education  2018 [place] Diet for Pancreatitis or Gallbladder Conditions A low-fat diet can be helpful if you have pancreatitis or a gallbladder condition. With these  conditions, your pancreas and gallbladder have trouble digesting fats. A healthy eating plan with less fat will help rest your pancreas and gallbladder and reduce your symptoms. What do I need to know about this diet?  Eat a low-fat diet. ? Reduce your fat intake to less than 20-30% of your total daily calories. This is less than 50-60 g of fat per day. ? Remember that you need some fat in your diet. Ask your dietician what your daily goal should be. ? Choose nonfat and low-fat healthy foods. Look for the words "nonfat," "low fat," or "fat free." ? As a guide, look on the label and choose foods with less than 3 g of fat per serving. Eat only one serving.  Avoid alcohol.  Do not smoke. If you need help quitting, talk with your health care provider.  Eat small frequent meals instead of three large heavy meals. What foods can I eat? Grains Include healthy grains and starches such as potatoes, wheat bread, fiber-rich cereal, and brown rice. Choose whole grain options whenever possible. In adults, whole grains should account for 45-65% of your daily calories. Fruits and Vegetables Eat plenty of fruits and vegetables. Fresh fruits and vegetables add fiber to your diet. Meats and Other Protein Sources Eat lean meat such as chicken and pork. Trim any fat off of meat before cooking it. Eggs, fish, and beans are other sources of protein. In adults, these foods should account for 10-35% of your daily calories. Dairy Choose low-fat milk and dairy options. Dairy includes fat and protein, as well as calcium. Fats and Oils Limit  high-fat foods such as fried foods, sweets, baked goods, sugary drinks. Other Creamy sauces and condiments, such as mayonnaise, can add extra fat. Think about whether or not you need to use them, or use smaller amounts or low fat options. What foods are not recommended?  High fat foods, such as: ? [employer]. ? Ice cream. ? [place] toast. ? Sweet rolls. ? Pizza. ? Cheese  bread. ? Foods covered with batter, butter, creamy sauces, or cheese. ? Fried foods. ? Sugary drinks and desserts.  Foods that cause gas or bloating This information is not intended to replace advice given to you by your health care provider. Make sure you discuss any questions you have with your health care provider. Document Released: 01/12/2013 Document Revised: 06/15/2015 Document Reviewed: 12/21/2012 Elsevier Interactive Patient Education  2017 [employer].

## 2017-09-24 NOTE — Progress Notes (Deleted)
Office Visit Note  Patient: Sandra Freeman             Date of Birth: 12/08/1960           MRN: [id number]             PCP: Marletta Lor, MD Referring: Marletta Lor, MD Visit Date: 10/08/2017 Occupation: @GUAROCC @  Subjective:  No chief complaint on file.   History of Present Illness: Sandra Freeman is a 57 y.o. female ***   Activities of Daily Living:  Patient reports morning stiffness for *** {minute/hour:19697}.   Patient {ACTIONS;DENIES/REPORTS:21021675::"Denies"} nocturnal pain.  Difficulty dressing/grooming: {ACTIONS;DENIES/REPORTS:21021675::"Denies"} Difficulty climbing stairs: {ACTIONS;DENIES/REPORTS:21021675::"Denies"} Difficulty getting out of chair: {ACTIONS;DENIES/REPORTS:21021675::"Denies"} Difficulty using hands for taps, buttons, cutlery, and/or writing: {ACTIONS;DENIES/REPORTS:21021675::"Denies"}  No Rheumatology ROS completed.   PMFS History:  Patient Active Problem List   Diagnosis Date Noted  . DDD (degenerative disc disease), cervical 03/28/2017  . DDD (degenerative disc disease), lumbar 03/28/2017  . Paresthesia 12/05/2015  . TELOGEN EFFLUVIUM 01/26/2009  . GASTROPARESIS 10/21/2008  . Essential hypertension 03/18/2008  . ANEMIA, IRON DEFICIENCY 06/17/2007  . FATTY LIVER DISEASE 06/17/2007  . Anxiety state 06/02/2007  . ASCUS PAP 04/23/2007  . LIVER FUNCTION TESTS, ABNORMAL 02/10/2007  . OBESITY 02/06/2007  . DEPRESSION 02/06/2007  . Allergic rhinitis 02/06/2007  . Asthma 02/06/2007  . GERD 02/06/2007    Past Medical History:  Diagnosis Date  . ALLERGIC RHINITIS 02/06/2007  . ANEMIA, IRON DEFICIENCY 06/17/2007  . ANXIETY DISORDER 06/02/2007  . ASCUS PAP 04/23/2007  . ASTHMA 02/06/2007  . DEPRESSION 02/06/2007  . DIZZINESS 02/13/2007  . Essential hypertension, benign 04/16/2007  . FATTY LIVER DISEASE 06/17/2007  . Gastroparesis 10/21/2008  . GERD 02/06/2007  . HYPERTENSION 03/18/2008  . HYPOTHYROIDISM 02/06/2007  . LIVER FUNCTION  TESTS, ABNORMAL 02/10/2007  . NONSPEC ELEVATION OF LEVELS OF TRANSAMINASE/LDH 06/02/2007  . OBESITY 02/06/2007  . 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 WRIST SPRAIN, RIGHT 05/23/2008    Family History  Problem Relation Age of Onset  . Cancer Mother        breast  . Heart disease Mother   . COPD Mother   . Diabetes Father   . Cancer Father        lung  . Diabetes Sister   . Diabetes Brother   . Diabetes Brother    Past Surgical History:  Procedure Laterality Date  . ANTERIOR FUSION CERVICAL SPINE    . CATARACT EXTRACTION    . KNEE ARTHROSCOPY     left  . KNEE SURGERY    . POSTERIOR CERVICAL LAMINECTOMY    . TUBAL LIGATION     Social History   Social History Narrative   Lives at home with finace.   Right-handed.   2-2.5 cups caffeine daily.    Objective: Vital Signs: LMP 01/04/2010    Physical Exam   Musculoskeletal Exam: ***  CDAI Exam: CDAI Score: Not documented Patient Global Assessment: Not documented; Provider Global Assessment: Not documented Swollen: Not documented; Tender: Not documented Joint Exam   Not documented   There is currently no information documented on the homunculus. Go to the Rheumatology activity and complete the homunculus joint exam.  Investigation: No additional findings.  Imaging: US Abdomen Limited Ruq  Result Date: 09/18/2017 CLINICAL DATA:  Right upper quadrant pain EXAM: ULTRASOUND ABDOMEN LIMITED RIGHT UPPER QUADRANT COMPARISON:  CT abdomen 03/04/2005 FINDINGS: Gallbladder: Gallbladder is visualized and no gallstones are noted. There is no pain over the gallbladder with compression.  Common bile duct: Diameter: The common bile duct is normal measuring 3.5 mm in diameter. Liver: The parenchyma of the liver is minimally echogenic suggesting hepatic steatosis. No focal hepatic abnormality is seen. Portal vein is patent on color Doppler imaging with normal direction of blood flow towards the liver. IMPRESSION: 1. No gallstones.  No ductal  dilatation. 2. Question mild hepatic steatosis. Electronically Signed   By: Ivar Drape M.D.   On: 09/18/2017 13:14    Recent Labs: Lab Results  Component Value Date   WBC 5.9 02/21/2017   HGB 14.3 02/21/2017   PLT 208.0 02/21/2017   NA 142 02/21/2017   K 4.1 02/21/2017   CL 104 02/21/2017   CO2 29 02/21/2017   GLUCOSE 101 (H) 02/21/2017   BUN 16 02/21/2017   CREATININE 0.87 02/21/2017   BILITOT 0.8 09/19/2016   ALKPHOS 52 09/19/2016   AST 15 03/18/2017   ALT 26 03/18/2017   PROT 6.2 09/19/2016   ALBUMIN 4.0 09/19/2016   CALCIUM 9.9 02/21/2017   GFRAA  10/20/2008    >60        The eGFR has been calculated using the MDRD equation. This calculation has not been validated in all clinical situations. eGFR's persistently <60 mL/min signify possible Chronic Kidney Disease.    Speciality Comments: No specialty comments available.  Procedures:  No procedures performed Allergies: Patient has no known allergies.   Assessment / Plan:     Visit Diagnoses: Primary osteoarthritis of both hands - She had an ultrasound of bilateral hands on 03/26/17 that did not reveal synovitis.  She has lab work on 02/2617: RF negative, CCP negative, 14-3-3 eta negative  DDD (degenerative disc disease), cervical  DDD (degenerative disc disease), lumbar  Chronic pain of right ankle  Essential hypertension  Gastroparesis  Fatty liver  History of gastroesophageal reflux (GERD)  History of anxiety  History of depression  History of anemia   Orders: No orders of the defined types were placed in this encounter.  No orders of the defined types were placed in this encounter.   Face-to-face time spent with patient was *** minutes. Greater than 50% of time was spent in counseling and coordination of care.  Follow-Up Instructions: No follow-ups on file.   Ofilia Neas, PA-C  Note - This record has been created using Dragon software.  Chart creation errors have been sought, but may  not always  have been located. Such creation errors do not reflect on  the standard of medical care.

## 2017-10-03 ENCOUNTER — Other Ambulatory Visit: Payer: Self-pay | Admitting: Internal Medicine

## 2017-10-08 ENCOUNTER — Ambulatory Visit: Payer: BLUE CROSS/BLUE SHIELD | Admitting: Rheumatology

## 2017-10-14 ENCOUNTER — Telehealth: Payer: Self-pay | Admitting: Internal Medicine

## 2017-10-14 NOTE — Telephone Encounter (Signed)
Called pt to inform her that Dr. Ardis Hughs is her GI doctor. Pt did not answer and mailbox was full.

## 2017-10-14 NOTE — Telephone Encounter (Signed)
Copied from [place] [phone]. Topic: Referral - Request >> Oct 14, 2017 10:58 AM Alfredia Ferguson R wrote: Pt is calling in stating her stomach issues have started back up. Per note from Dr Raliegh Ip if this happened pt will need to follow up with Dr Ardis Hughs through [facility].

## 2017-10-15 NOTE — Telephone Encounter (Signed)
Pt notified of GI doctor via mychart.

## 2017-10-23 ENCOUNTER — Encounter: Payer: Self-pay | Admitting: Gastroenterology

## 2017-10-23 ENCOUNTER — Encounter: Payer: BLUE CROSS/BLUE SHIELD | Admitting: Family Medicine

## 2017-10-23 ENCOUNTER — Ambulatory Visit: Payer: BLUE CROSS/BLUE SHIELD | Admitting: Gastroenterology

## 2017-10-23 VITALS — BP 124/78 | HR 71 | Ht 60.0 in | Wt 190.0 lb

## 2017-10-23 DIAGNOSIS — R1013 Epigastric pain: Secondary | ICD-10-CM | POA: Diagnosis not present

## 2017-10-23 DIAGNOSIS — Z1211 Encounter for screening for malignant neoplasm of colon: Secondary | ICD-10-CM

## 2017-10-23 DIAGNOSIS — R142 Eructation: Secondary | ICD-10-CM | POA: Diagnosis not present

## 2017-10-23 DIAGNOSIS — R140 Abdominal distension (gaseous): Secondary | ICD-10-CM

## 2017-10-23 DIAGNOSIS — K219 Gastro-esophageal reflux disease without esophagitis: Secondary | ICD-10-CM

## 2017-10-23 MED ORDER — PEG 3350-KCL-NA BICARB-NACL 420 G PO SOLR: 4000.0000 mL | Freq: Once | ORAL | 0 refills | Status: AC

## 2017-10-23 MED ORDER — PANTOPRAZOLE SODIUM 40 MG PO TBEC: 40.0000 mg | DELAYED_RELEASE_TABLET | Freq: Two times a day (BID) | ORAL | 2 refills | Status: DC

## 2017-10-23 NOTE — Patient Instructions (Addendum)
If you are age 59 or older, your body mass index should be between 23-30. Your Body mass index is 37.11 kg/m. If this is out of the aforementioned range listed, please consider follow up with your Primary Care Provider.  If you are age 25 or younger, your body mass index should be between 19-25. Your Body mass index is 37.11 kg/m. If this is out of the aformentioned range listed, please consider follow up with your Primary Care Provider.   You have been scheduled for an endoscopy and colonoscopy. Please follow the written instructions given to you at your visit today. Please pick up your prep supplies at the pharmacy within the next 1-3 days. If you use inhalers (even only as needed), please bring them with you on the day of your procedure. Your physician has requested that you go to www.startemmi.com and enter the access code given to you at your visit today. This web site gives a general overview about your procedure. However, you should still follow specific instructions given to you by our office regarding your preparation for the procedure.  We have sent the following medications to your pharmacy for you to pick up at your convenience: Pantoprazole  Thank you for choosing me and [facility].  Alonza Bogus, PA-C

## 2017-10-23 NOTE — Progress Notes (Signed)
10/23/2017 Sandra Freeman [id number] 1961/01/08   HISTORY OF PRESENT ILLNESS: This is a 57 year old female who is a patient of Dr. Ardis Hughs from 2009.  She is here today at the request of her PCP, Dr. Burnice Logan, for evaluation regarding upper abdominal complaints.  She told me that she knows that she has had reflux issues on and off for a long time.  She had not been on any acid reflux medications, however.  Then about 1.5 months ago she started with a lot of epigastric abdominal pain and bloating.  Describes that she was just very uncomfortable.  One night had episodes of vomiting and this was followed by epigastric and right upper quadrant abdominal pain that radiated into her back.  She was sent for abdominal ultrasound that was ordered by her PCP and was unremarkable except for question mild hepatic steatosis.  She was placed on pantoprazole 40 mg daily and was also given Reglan to take 4 times a day.  Reglan only continued for short time and she is no longer taking that.  She is still on the PPI once daily, however.  She said she is no longer having that severe right upper quadrant abdominal pain, but still is feels tender and sore and bloated in her upper abdomen.  Complaining of a lot of belching.  Says no matter what she eats or drinks she gets bloated.  No further episodes of nausea or vomiting.  She had an EGD performed in May 2009 that was normal.   Last colonoscopy was in May 2009, which was normal.  Overall moves her bowels regularly.  No major changes or issues.  No blood in stools or black stools.   Past Medical History:  Diagnosis Date  . ALLERGIC RHINITIS 02/06/2007  . ANEMIA, IRON DEFICIENCY 06/17/2007  . ANXIETY DISORDER 06/02/2007  . ASCUS PAP 04/23/2007  . ASTHMA 02/06/2007  . DEPRESSION 02/06/2007  . DIZZINESS 02/13/2007  . Essential hypertension, benign 04/16/2007  . FATTY LIVER DISEASE 06/17/2007  . Gastroparesis 10/21/2008  . GERD 02/06/2007  . HYPERTENSION 03/18/2008  .  HYPOTHYROIDISM 02/06/2007  . LIVER FUNCTION TESTS, ABNORMAL 02/10/2007  . NONSPEC ELEVATION OF LEVELS OF TRANSAMINASE/LDH 06/02/2007  . OBESITY 02/06/2007  . 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 WRIST SPRAIN, RIGHT 05/23/2008   Past Surgical History:  Procedure Laterality Date  . ANTERIOR FUSION CERVICAL SPINE    . CATARACT EXTRACTION    . KNEE ARTHROSCOPY     left  . KNEE SURGERY    . POSTERIOR CERVICAL LAMINECTOMY    . TUBAL LIGATION      reports that she has never smoked. She has never used smokeless tobacco. She reports that she drinks alcohol. She reports that she does not use drugs. family history includes COPD in her mother; Cancer in her father and mother; Diabetes in her brother, brother, father, and sister; Heart disease in her mother. No Known Allergies    Outpatient Encounter Medications as of 10/23/2017  Medication Sig  . ALPRAZolam (XANAX) 0.25 MG tablet TAKE ONE TO TWO TABLETS BY MOUTH ONCE DAILY AS NEEDED  . Ascorbic Acid (VITAMIN C PO) Take by mouth daily.  [name] Kitchen buPROPion (WELLBUTRIN XL) 300 MG 24 hr tablet TAKE 1 TABLET BY MOUTH ONCE DAILY  . diazepam (VALIUM) 5 MG tablet TAKE 1 TABLET BY MOUTH EVERY 12 HOURS AS NEEDED FOR ANXIETY  . diclofenac sodium (VOLTAREN) 1 % GEL Apply 3 gm to 3 large joints up to 3 times a day.Dispense  3 tubes with 3 refills.  . Fluticasone-Salmeterol (ADVAIR DISKUS) 250-50 MCG/DOSE AEPB Inhale 1 puff into the lungs every 12 (twelve) hours. (Patient taking differently: Inhale 1 puff into the lungs as needed. )  . Multiple Vitamins-Minerals (MULTIVITAMIN ADULT PO) Take by mouth daily.  . pantoprazole (PROTONIX) 40 MG tablet Take 1 tablet (40 mg total) by mouth daily. (Patient taking differently: Take 40 mg by mouth 2 (two) times daily before a meal. )  . [DISCONTINUED] benzonatate (TESSALON) 200 MG capsule TAKE 1 CAPSULE BY MOUTH TWICE DAILY AS NEEDED FOR COUGH (Patient not taking: Reported on 10/23/2017)  . [DISCONTINUED] buPROPion (WELLBUTRIN XL) 300 MG 24  hr tablet Take 1 tablet (300 mg total) by mouth daily. (Patient not taking: Reported on 10/23/2017)  . [DISCONTINUED] metoCLOPramide (REGLAN) 10 MG tablet TAKE 1 TABLET BY MOUTH 4 TIMES DAILY BEFORE MEAL(S) AND AT BEDTIME (Patient not taking: Reported on 10/23/2017)   No facility-administered encounter medications on file as of 10/23/2017.      REVIEW OF SYSTEMS  : All other systems reviewed and negative except where noted in the History of Present Illness.   PHYSICAL EXAM: BP 124/78   Pulse 71   Ht 5' (1.524 m)   Wt 190 lb (86.2 kg)   LMP 01/04/2010   SpO2 99%   BMI 37.11 kg/m  General: Well developed white female in no acute distress Head: Normocephalic and atraumatic Eyes:  Sclerae anicteric, conjunctiva pink. Ears: Normal auditory acuity  Lungs: Clear throughout to auscultation; no increased WOB. Heart: Regular rate and rhythm; no M/R/G. Abdomen: Soft, non-distended.  BS present.  Mild diffuse TTP. Rectal:  Will be done at the time of colonoscopy. Musculoskeletal: Symmetrical with no gross deformities  Skin: No lesions on visible extremities Extremities: No edema  Neurological: Alert oriented x 4, grossly non-focal Psychological:  Alert and cooperative. Normal mood and affect  ASSESSMENT AND PLAN: *Epigastric abdominal pain, bloating, belching, GERD: Reports history of acid reflux issues, but had not been on any acid reducing medication until just recently when she was started on pantoprazole 40 mg daily.  We will schedule for EGD to rule out reflux related issues such as esophagitis, etc.  Going to increase her pantoprazole to 40 mg twice daily for now.  Pending results of endoscopy if it is unremarkable and she continues with symptoms, particularly pain on the right side, then would consider HIDA scan for evaluation of biliary dyskinesia. *Screening colonoscopy:  Last was 10 years ago.  Will schedule with Dr. Ardis Hughs.  **The risks, benefits, and alternatives to EGD and  colonoscopy were discussed with the patient and she consents to proceed.    CC:  Marletta Lor, MD

## 2017-10-24 NOTE — Progress Notes (Signed)
I agree with the above note, plan 

## 2017-10-30 ENCOUNTER — Encounter: Payer: BLUE CROSS/BLUE SHIELD | Admitting: Family Medicine

## 2017-11-18 ENCOUNTER — Telehealth: Payer: Self-pay

## 2017-11-18 NOTE — Telephone Encounter (Signed)
Spoke with [employer] - prior authorization approved Pantoprazole 40 mg bid.  Pharmacy aware. Pt aware.

## 2017-11-19 ENCOUNTER — Encounter: Payer: BLUE CROSS/BLUE SHIELD | Admitting: Gastroenterology

## 2017-12-01 ENCOUNTER — Encounter: Payer: BLUE CROSS/BLUE SHIELD | Admitting: Family Medicine

## 2017-12-15 ENCOUNTER — Other Ambulatory Visit: Payer: Self-pay | Admitting: Internal Medicine

## 2017-12-23 ENCOUNTER — Other Ambulatory Visit: Payer: Self-pay | Admitting: Internal Medicine

## 2017-12-26 ENCOUNTER — Encounter: Payer: Self-pay | Admitting: Family Medicine

## 2017-12-26 ENCOUNTER — Telehealth: Payer: Self-pay
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
## 2017-12-26 NOTE — Telephone Encounter (Signed)
Pt called stating that she wanted a refill. Pt was advise to set up care with a new PCP to get further refills. Pt was upset and stated that she has ben here for years and she think its stupid that she have to get set up for further refills. Pt stated that she has not been able to come in due to family issues. Pt was informed again to come in and she started yelling and hung up. Pt has canceled multiple appt with Dr.Koberlien for transfer of care and I informed pt of retirement in a Sept.2019 my chart message. Pt stated she will go somewhere else.

## 2018-01-29 ENCOUNTER — Ambulatory Visit: Payer: BLUE CROSS/BLUE SHIELD | Admitting: Family Medicine

## 2018-01-29 ENCOUNTER — Encounter: Payer: Self-pay | Admitting: Family Medicine

## 2018-01-29 VITALS — BP 110/80 | HR 58 | Temp 97.6°F | Ht 60.0 in | Wt 198.4 lb

## 2018-01-29 DIAGNOSIS — F32A Depression, unspecified: Secondary | ICD-10-CM

## 2018-01-29 DIAGNOSIS — F419 Anxiety disorder, unspecified: Secondary | ICD-10-CM

## 2018-01-29 DIAGNOSIS — F329 Major depressive disorder, single episode, unspecified: Secondary | ICD-10-CM

## 2018-01-29 DIAGNOSIS — F4310 Post-traumatic stress disorder, unspecified: Secondary | ICD-10-CM | POA: Insufficient documentation

## 2018-01-29 MED ORDER — HYDROXYZINE HCL 25 MG PO TABS: 25.0000 mg | ORAL_TABLET | Freq: Three times a day (TID) | ORAL | 0 refills | Status: DC | PRN

## 2018-01-29 MED ORDER — VENLAFAXINE HCL ER 75 MG PO CP24: 75.0000 mg | ORAL_CAPSULE | Freq: Every day | ORAL | 0 refills | Status: DC

## 2018-01-29 NOTE — Patient Instructions (Signed)
STOP WELLBUTRIn and LEXAPRO-  We are going to start effexor for ptsd, anxiety and depression. Will take once/day. Can increase if we need too. Also sending in hydroxyzine prn for anxiety attacks. Can take up to three times/day. May make you sleepy. Please f/u with psyhciatry.
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
## 2018-02-05 ENCOUNTER — Encounter: Payer: Self-pay | Admitting: Family Medicine

## 2018-02-06 ENCOUNTER — Telehealth: Payer: Self-pay | Admitting: Family Medicine

## 2018-02-06 NOTE — Telephone Encounter (Signed)
Patient has not heard anything regarding her psychiatry referral and wanted to check up on this... just sending to you to see if you can call patient and let her know process?   Thanks!  aw

## 2018-02-27 ENCOUNTER — Other Ambulatory Visit: Payer: Self-pay

## 2018-02-27 ENCOUNTER — Encounter: Payer: Self-pay | Admitting: Family Medicine

## 2018-02-27 MED ORDER — VENLAFAXINE HCL ER 75 MG PO CP24: 75.0000 mg | ORAL_CAPSULE | Freq: Every day | ORAL | 0 refills | Status: DC

## 2018-03-02 ENCOUNTER — Ambulatory Visit: Payer: BLUE CROSS/BLUE SHIELD | Admitting: Family Medicine

## 2018-03-02 ENCOUNTER — Encounter: Payer: Self-pay | Admitting: Family Medicine

## 2018-03-02 VITALS — BP 122/80 | HR 77 | Temp 98.5°F | Ht 60.0 in | Wt 197.2 lb

## 2018-03-02 DIAGNOSIS — Z0000 Encounter for general adult medical examination without abnormal findings: Secondary | ICD-10-CM | POA: Diagnosis not present

## 2018-03-02 DIAGNOSIS — Z114 Encounter for screening for human immunodeficiency virus [HIV]: Secondary | ICD-10-CM

## 2018-03-02 DIAGNOSIS — F329 Major depressive disorder, single episode, unspecified: Secondary | ICD-10-CM

## 2018-03-02 DIAGNOSIS — F419 Anxiety disorder, unspecified: Secondary | ICD-10-CM

## 2018-03-02 DIAGNOSIS — F32A Depression, unspecified: Secondary | ICD-10-CM

## 2018-03-02 LAB — CBC WITH DIFFERENTIAL/PLATELET
BASOS ABS: 0.1 10*3/uL (ref 0.0–0.1)
Basophils Relative: 1 % (ref 0.0–3.0)
Eosinophils Absolute: 0.1 10*3/uL (ref 0.0–0.7)
Eosinophils Relative: 1.5 % (ref 0.0–5.0)
HCT: 43 % (ref 36.0–46.0)
Hemoglobin: 14.5 g/dL (ref 12.0–15.0)
Lymphocytes Relative: 32.6 % (ref 12.0–46.0)
Lymphs Abs: 1.8 10*3/uL (ref 0.7–4.0)
MCHC: 33.6 g/dL (ref 30.0–36.0)
MCV: 89.8 fl (ref 78.0–100.0)
Monocytes Absolute: 0.3 10*3/uL (ref 0.1–1.0)
Monocytes Relative: 6 % (ref 3.0–12.0)
Neutro Abs: 3.2 10*3/uL (ref 1.4–7.7)
Neutrophils Relative %: 58.9 % (ref 43.0–77.0)
Platelets: 174 10*3/uL (ref 150.0–400.0)
RBC: 4.79 Mil/uL (ref 3.87–5.11)
RDW: 13 % (ref 11.5–15.5)
WBC: 5.5 10*3/uL (ref 4.0–10.5)

## 2018-03-02 LAB — TSH: TSH: 2.53 u[IU]/mL (ref 0.35–4.50)

## 2018-03-02 LAB — COMPREHENSIVE METABOLIC PANEL
ALT: 46 U/L — AB (ref 0–35)
AST: 30 U/L (ref 0–37)
Albumin: 4.3 g/dL (ref 3.5–5.2)
Alkaline Phosphatase: 81 U/L (ref 39–117)
BUN: 16 mg/dL (ref 6–23)
CO2: 27 mEq/L (ref 19–32)
Calcium: 9.5 mg/dL (ref 8.4–10.5)
Chloride: 105 mEq/L (ref 96–112)
Creatinine, Ser: 0.95 mg/dL (ref 0.40–1.20)
GFR: 60.01 mL/min (ref >60.00)
Glucose, Bld: 101 mg/dL — ABNORMAL HIGH (ref 70–99)
Potassium: 4.3 mEq/L (ref 3.5–5.1)
Sodium: 142 mEq/L (ref 135–145)
Total Bilirubin: 0.6 mg/dL (ref 0.2–1.2)
Total Protein: 6.5 g/dL (ref 6.0–8.3)

## 2018-03-02 LAB — LIPID PANEL
Cholesterol: 208 mg/dL — ABNORMAL HIGH (ref 0–200)
HDL: 51.8 mg/dL (ref >39.00)
LDL Cholesterol: 133 mg/dL — ABNORMAL HIGH (ref 0–99)
NonHDL: 155.91
Total CHOL/HDL Ratio: 4
Triglycerides: 113 mg/dL (ref 0.0–149.0)
VLDL: 22.6 mg/dL (ref 0.0–40.0)

## 2018-03-02 LAB — VITAMIN D 25 HYDROXY (VIT D DEFICIENCY, FRACTURES): VITD: 40.4 ng/mL (ref 30.00–100.00)

## 2018-03-02 MED ORDER — DICLOFENAC SODIUM 1 % TD GEL: TRANSDERMAL | 1 refills | Status: DC

## 2018-03-02 MED ORDER — CLOTRIMAZOLE-BETAMETHASONE 1-0.05 % EX CREA: 1.0000 "application " | TOPICAL_CREAM | Freq: Two times a day (BID) | CUTANEOUS | 3 refills | Status: DC

## 2018-03-02 MED ORDER — VENLAFAXINE HCL ER 150 MG PO CP24: 150.0000 mg | ORAL_CAPSULE | Freq: Every day | ORAL | 1 refills | Status: DC

## 2018-03-02 NOTE — Patient Instructions (Signed)
Increasing your effexor to 165m/day. I sent in new prescription for this, but you can take 2 of your 770muntil you run out and then start the higher dose.   Will do all of your labs today and information given for mmg/cologuard. Email me if you want to do the cologuard so I can order this for you.   See you back in one month or if you get in with psychiatry you can just follow up with them.

## 2018-03-02 NOTE — Progress Notes (Signed)
Patient: Sandra Freeman MRN: [id number] DOB: May 15, 1960 PCP: Orma Flaming, MD     Subjective:  Chief Complaint  Patient presents with  . Depression  . Anxiety    HPI: The patient is a 58 y.o. female who presents today for anxiety and depression follow up after we started her on effexor to help with the above mentioned as well as PTSD. I also referred her to psychiatry as she has a mutitude of complex psychiatric issues going on. I did titrate her up to 92m of the effexor. She is still struggling and feels like she is in a low point more with her ptsd. She is doing better and has been to the gym. Her main concern is her PTSD which she has never been officially diagnosed with. She feels like she is continually falling down a tunnel. She is still in counseling and doing well at her job. She just finds herself not wanting to do anything on the weekends.   Annual exam: Also due for her annual exam. Needs lab and all of her HM updated. She has not been hospitalized. She is working out 2-3 days/week. No change in medical history.   Fh of COPD in her mother and CVA. Mother also had breast cancer. She is deceased. Father hx not well known. She thinks he had skin cancer-? melanoma. And had massive MI. Hx of alcoholism. Diabetes in family as well.   Due for mmg-information given. Really needs to do with maternal hx.  Declines tdap/flu Declines cscope. Did discuss options and information given  Pap smear-will come for this.   Review of Systems  Constitutional: Positive for fatigue. Negative for chills and fever.  HENT: Negative for dental problem, ear pain, hearing loss and trouble swallowing.   Eyes: Negative for visual disturbance.  Respiratory: Negative for cough, chest tightness and shortness of breath.   Cardiovascular: Negative for chest pain, palpitations and leg swelling.  Gastrointestinal: Negative for abdominal pain, blood in stool, diarrhea and nausea.  Endocrine: Negative for cold  intolerance, polydipsia, polyphagia and polyuria.  Genitourinary: Negative for dysuria and hematuria.  Musculoskeletal: Negative for arthralgias.  Skin: Negative for rash.  Neurological: Negative for dizziness and headaches.  Psychiatric/Behavioral: Positive for dysphoric mood and sleep disturbance. The patient is not nervous/anxious.     Allergies Patient has No Known Allergies.  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  Surgical History Patient  has a past surgical history that includes Tubal ligation; Knee surgery; Cataract extraction; Posterior cervical laminectomy; Knee arthroscopy; and Anterior fusion cervical spine.  Family History Pateint's family history includes COPD in her mother; Cancer in her father and mother; Diabetes in her brother, brother, father, and sister; Heart disease in her mother.  Social History Patient  reports that she has never smoked. She has never used smokeless tobacco. She reports current alcohol use. She reports that she does not use drugs.    Objective: Vitals:   03/02/18 0805  BP: 122/80  Pulse: 77  Temp: 98.5 F (36.9 C)  TempSrc: Oral  SpO2: 96%  Weight: 197 lb 3.2 oz (89.4 kg)  Height: 5' (1.524 m)  Body mass index is 38.51 kg/m.  Physical Exam Vitals signs reviewed.  Constitutional:      Appearance: She is obese.  HENT:     Right Ear: Tympanic membrane, ear canal and  external ear normal.     Left Ear: Ear canal and external ear normal.     Nose: Nose normal.     Mouth/Throat:     Mouth: Mucous membranes are moist.  Neck:     Musculoskeletal: Normal range of motion and neck supple.  Cardiovascular:     Rate and Rhythm: Normal rate and regular rhythm.     Heart sounds: Normal heart sounds.  Pulmonary:     Effort: Pulmonary effort is normal.     Breath sounds: Normal breath sounds.  Abdominal:     General: Abdomen is flat. Bowel sounds are normal.     Palpations: Abdomen is soft.  Neurological:     General: No focal deficit present.     Mental Status: She is alert and oriented to person, place, and time.  Psychiatric:     Comments: Tearful at times           Office Visit from 03/02/2018 in [facility]  PHQ-9 Total Score  12     GAD 7 : Generalized Anxiety Score 03/02/2018 01/29/2018  Nervous, Anxious, on Edge 2 3  Control/stop worrying 2 3  Worry too much - different things 1 3  Trouble relaxing 2 2  Restless 0 1  Easily annoyed or irritable 2 2  Afraid - awful might happen 0 1  Total GAD 7 Score 9 15  Anxiety Difficulty Very difficult Extremely difficult     Assessment/plan: 1. Annual physical exam Routine lab work. +FH of breast cancer in her mother and discussed need to get this done. Handout given on mmg places. Also discussed cologuard for cancer screening and information given. She will let me know if she wants to go through with this. Will come back for pap smear. Declines recommended immunizations. Continue exercise and healthy eating. Recommended weight loss.  Patient counseling [x]    Nutrition: Stressed importance of moderation in sodium/caffeine intake, saturated fat and cholesterol, caloric balance, sufficient intake of fresh fruits, vegetables, fiber, calcium, iron, and 1 mg of folate supplement per day (for females capable of pregnancy).  [x]    Stressed the importance of regular exercise.   []     Substance Abuse: Discussed cessation/primary prevention of tobacco, alcohol, or other drug use; driving or other dangerous activities under the influence; availability of treatment for abuse.   [x]    Injury prevention: Discussed safety belts, safety helmets, smoke detector, smoking near bedding or upholstery.   [x]    Sexuality: Discussed sexually transmitted diseases, partner selection, use of condoms, avoidance of unintended pregnancy  and contraceptive alternatives.  [x]    Dental health: Discussed importance of regular tooth brushing, flossing, and dental visits.  [x]    Health maintenance and immunizations reviewed. Please refer to Health maintenance section.    - diclofenac sodium (VOLTAREN) 1 % GEL; Apply 3 gm to 3 large joints up to 3 times a day.Dispense 3 tubes with 3 refills.  Dispense: 3 Tube; Refill: 1 - clotrimazole-betamethasone (LOTRISONE) cream; Apply 1 application topically 2 (two) times daily.  Dispense: 30 g; Refill: 3 - CBC with Differential/Platelet - Comprehensive metabolic panel - Lipid panel - TSH  2. Anxiety and depression Seems to be more possible PTSD. Her scores are both improved on her gad7/phq9, but her issues seem to  be more with her ptsd. She can tell a difference on her medication and we are going to increase her effexor to 123m. She never heard from the psychiatry referral. I checked in on this and they are just going to do another referral for her. She is going to continue her exercise and counseling. She doesn't need to f/u with me if gets in with psychiatry within next month or two, otherwise will need to come back and see me in one month.  - VITAMIN D 25 Hydroxy (Vit-D Deficiency, Fractures)  3. Encounter for screening for HIV  - HIV Antibody (routine testing w rflx)   Return in about 1 month (around 03/31/2018) for anxiety/depression/ptsd .   AOrma Flaming MD [place] 03/02/2018

## 2018-03-03 LAB — HIV ANTIBODY (ROUTINE TESTING W REFLEX): HIV: NONREACTIVE

## 2018-03-16 ENCOUNTER — Other Ambulatory Visit: Payer: Self-pay | Admitting: Gastroenterology

## 2018-03-17 ENCOUNTER — Encounter: Payer: Self-pay | Admitting: Family Medicine

## 2018-03-17 ENCOUNTER — Other Ambulatory Visit: Payer: Self-pay

## 2018-03-17 MED ORDER — PANTOPRAZOLE SODIUM 40 MG PO TBEC: 40.0000 mg | DELAYED_RELEASE_TABLET | Freq: Two times a day (BID) | ORAL | 1 refills | Status: DC

## 2018-03-23 ENCOUNTER — Encounter: Payer: Self-pay | Admitting: Family Medicine

## 2018-03-23 ENCOUNTER — Other Ambulatory Visit: Payer: Self-pay

## 2018-03-23 MED ORDER — BENZONATATE 200 MG PO CAPS: 200.0000 mg | ORAL_CAPSULE | Freq: Three times a day (TID) | ORAL | 0 refills | Status: DC | PRN

## 2018-04-08 ENCOUNTER — Ambulatory Visit: Payer: BLUE CROSS/BLUE SHIELD | Admitting: Physician Assistant

## 2018-05-06 ENCOUNTER — Other Ambulatory Visit: Payer: Self-pay

## 2018-05-06 ENCOUNTER — Encounter: Payer: Self-pay | Admitting: Physician Assistant

## 2018-05-06 ENCOUNTER — Ambulatory Visit (INDEPENDENT_AMBULATORY_CARE_PROVIDER_SITE_OTHER): Payer: BLUE CROSS/BLUE SHIELD | Admitting: Physician Assistant

## 2018-05-06 DIAGNOSIS — F411 Generalized anxiety disorder: Secondary | ICD-10-CM

## 2018-05-06 DIAGNOSIS — F331 Major depressive disorder, recurrent, moderate: Secondary | ICD-10-CM | POA: Diagnosis not present

## 2018-05-06 DIAGNOSIS — F4310 Post-traumatic stress disorder, unspecified: Secondary | ICD-10-CM | POA: Diagnosis not present

## 2018-05-06 MED ORDER — VENLAFAXINE HCL ER 225 MG PO TB24: 225.0000 mg | ORAL_TABLET | ORAL | 1 refills | Status: DC

## 2018-05-06 MED ORDER — ALPRAZOLAM 0.5 MG PO TABS: 0.2500 mg | ORAL_TABLET | Freq: Two times a day (BID) | ORAL | 1 refills | Status: DC | PRN
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
## 2018-06-25 ENCOUNTER — Other Ambulatory Visit: Payer: Self-pay

## 2018-06-25 ENCOUNTER — Telehealth: Payer: Self-pay | Admitting: Physician Assistant

## 2018-06-25 MED ORDER — VENLAFAXINE HCL ER 225 MG PO TB24: 225.0000 mg | ORAL_TABLET | ORAL | 1 refills | Status: DC

## 2018-06-25 NOTE — Telephone Encounter (Signed)
Pt needs refill on Venlasaxine ER 273m [facility] to [facility] [address]. Pt has about 15 pills left. Pt is able to take medicine without any side effects.

## 2018-06-25 NOTE — Telephone Encounter (Signed)
Refill sent.

## 2018-07-20 ENCOUNTER — Other Ambulatory Visit: Payer: Self-pay

## 2018-07-20 ENCOUNTER — Ambulatory Visit: Payer: BLUE CROSS/BLUE SHIELD | Admitting: Physician Assistant
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
## 2018-08-07 ENCOUNTER — Telehealth: Payer: Self-pay | Admitting: Physician Assistant

## 2018-08-07 ENCOUNTER — Other Ambulatory Visit: Payer: Self-pay

## 2018-08-07 MED ORDER — VENLAFAXINE HCL ER 225 MG PO TB24: 225.0000 mg | ORAL_TABLET | ORAL | 0 refills | Status: DC

## 2018-08-07 NOTE — Telephone Encounter (Signed)
90 day submitted per request

## 2018-08-07 NOTE — Telephone Encounter (Signed)
Insurance covers 90 day for her Dimple Nanas, will update pharmacy

## 2018-08-07 NOTE — Telephone Encounter (Signed)
Pt needs refill on venflaxine sent in for 90days. He insurance will not pay for the 30day.She is still doing good on med. Send to [facility] [address].

## 2018-08-21 ENCOUNTER — Other Ambulatory Visit: Payer: Self-pay

## 2018-08-21 ENCOUNTER — Encounter: Payer: Self-pay | Admitting: Physician Assistant

## 2018-08-21 ENCOUNTER — Ambulatory Visit (INDEPENDENT_AMBULATORY_CARE_PROVIDER_SITE_OTHER): Payer: BC Managed Care – PPO | Admitting: Physician Assistant

## 2018-08-21 DIAGNOSIS — F411 Generalized anxiety disorder: Secondary | ICD-10-CM | POA: Diagnosis not present

## 2018-08-21 DIAGNOSIS — F331 Major depressive disorder, recurrent, moderate: Secondary | ICD-10-CM | POA: Diagnosis not present

## 2018-08-21 DIAGNOSIS — F4310 Post-traumatic stress disorder, unspecified: Secondary | ICD-10-CM

## 2018-08-21 MED ORDER — VENLAFAXINE HCL ER 150 MG PO CP24: 300.0000 mg | ORAL_CAPSULE | Freq: Every day | ORAL | 0 refills | Status: DC
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
## 2018-09-21 ENCOUNTER — Other Ambulatory Visit: Payer: Self-pay | Admitting: Family Medicine

## 2018-10-08 ENCOUNTER — Encounter: Payer: Self-pay | Admitting: Family Medicine

## 2018-10-08 ENCOUNTER — Other Ambulatory Visit: Payer: Self-pay

## 2018-10-08 DIAGNOSIS — Z0000 Encounter for general adult medical examination without abnormal findings: Secondary | ICD-10-CM

## 2018-10-08 MED ORDER — CLOTRIMAZOLE-BETAMETHASONE 1-0.05 % EX CREA: 1.0000 "application " | TOPICAL_CREAM | Freq: Two times a day (BID) | CUTANEOUS | 3 refills | Status: DC

## 2018-10-20 DIAGNOSIS — F4323 Adjustment disorder with mixed anxiety and depressed mood: Secondary | ICD-10-CM | POA: Diagnosis not present

## 2018-11-03 ENCOUNTER — Ambulatory Visit: Payer: BC Managed Care – PPO | Admitting: Physician Assistant

## 2018-11-05 ENCOUNTER — Telehealth: Payer: Self-pay

## 2018-11-05 NOTE — Telephone Encounter (Signed)
covermymeds incoming fax for new Rx approval. PA has been submitted today on 11-05-2018. Will await response.

## 2018-11-06 NOTE — Telephone Encounter (Signed)
Pantoprazole 40 mg has been approved for one year. 11-05-2018 to 11-05-2019

## 2019-01-26 ENCOUNTER — Other Ambulatory Visit: Payer: Self-pay | Admitting: Physician Assistant

## 2019-01-27 NOTE — Telephone Encounter (Signed)
Last apt 07/31, was due back in 6 weeks

## 2019-02-01 ENCOUNTER — Telehealth: Payer: Self-pay | Admitting: Physician Assistant

## 2019-02-01 ENCOUNTER — Other Ambulatory Visit: Payer: Self-pay

## 2019-02-01 MED ORDER — VENLAFAXINE HCL ER 150 MG PO CP24: 300.0000 mg | ORAL_CAPSULE | Freq: Every day | ORAL | 0 refills | Status: DC

## 2019-02-01 NOTE — Telephone Encounter (Signed)
Pt requesting a refill on her Effexor XR. Fill at the [facility] on [address]. Appt scheduled for 2/5.

## 2019-02-01 NOTE — Telephone Encounter (Signed)
Refill for Effexor XR submitted

## 2019-02-11 ENCOUNTER — Other Ambulatory Visit: Payer: Self-pay | Admitting: Family Medicine

## 2019-02-26 ENCOUNTER — Ambulatory Visit (INDEPENDENT_AMBULATORY_CARE_PROVIDER_SITE_OTHER): Payer: BC Managed Care – PPO | Admitting: Physician Assistant

## 2019-02-26 ENCOUNTER — Other Ambulatory Visit: Payer: Self-pay

## 2019-02-26 ENCOUNTER — Encounter: Payer: Self-pay | Admitting: Physician Assistant

## 2019-02-26 DIAGNOSIS — F411 Generalized anxiety disorder: Secondary | ICD-10-CM | POA: Diagnosis not present

## 2019-02-26 DIAGNOSIS — F4310 Post-traumatic stress disorder, unspecified: Secondary | ICD-10-CM | POA: Diagnosis not present

## 2019-02-26 DIAGNOSIS — F3341 Major depressive disorder, recurrent, in partial remission: Secondary | ICD-10-CM

## 2019-02-26 MED ORDER — QUETIAPINE FUMARATE 25 MG PO TABS: 25.0000 mg | ORAL_TABLET | Freq: Every evening | ORAL | 1 refills | Status: DC | PRN

## 2019-02-26 MED ORDER — ALPRAZOLAM 0.5 MG PO TABS: 0.2500 mg | ORAL_TABLET | Freq: Two times a day (BID) | ORAL | 1 refills | Status: DC | PRN

## 2019-02-26 NOTE — Progress Notes (Signed)
[facility]  Patient ID: Sandra Freeman,  MRN: [id number]  PCP: Orma Flaming, MD  Date of Evaluation: 02/26/2019 Time spent:30 minutes  Chief Complaint:  Chief Complaint    Follow-up; Depression; Post-Traumatic Stress Disorder      HISTORY/CURRENT STATUS: HPI For 6 month med check.  Only problem is having bad dreams. Not worse than normal.  "I take melatonin sometimes but I prefer to let my body deal with things like it wants to. I don't want to take anything for them.  The dreams are just uncomfortable, not terrorizing."  Doing great with meds. Patient denies loss of interest in usual activities and is able to enjoy things.  Denies decreased energy or motivation.  Appetite has not changed.  No extreme sadness, tearfulness, or feelings of hopelessness.  Denies any changes in concentration, making decisions or remembering things.  Denies suicidal or homicidal thoughts.  Anxiety is well-controlled.  Doesn't have to take the Xanax often. 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   Individual Medical History/ Review of Systems: Changes? :No    Past medications for mental health diagnoses include: Wellbutrin, Effexor (for a few months) It's helpful, Xanax, Valium,hydroxyzine was ineffective and caused her to be sedated for several days.  Allergies: Hydroxyzine  Current Medications:  Current Outpatient Medications:  .  albuterol (PROVENTIL HFA;VENTOLIN HFA) 108 (90 Base) MCG/ACT inhaler, Inhale into the lungs every 6 (six) hours as needed for wheezing or shortness of breath., Disp: , Rfl:  .  ALPRAZolam (XANAX) 0.5 MG tablet, Take 0.5-1 tablets (0.25-0.5 mg total) by mouth 2 (two) times daily as needed for anxiety., Disp: 30 tablet, Rfl: 1 .  B Complex-C (B-COMPLEX WITH VITAMIN C) tablet, Take 1 tablet by mouth daily., Disp: , Rfl:  .  clotrimazole-betamethasone (LOTRISONE) cream, Apply 1 application topically 2 (two) times daily., Disp: 30 g, Rfl: 3 .   diclofenac sodium (VOLTAREN) 1 % GEL, Apply 3 gm to 3 large joints up to 3 times a day.Dispense 3 tubes with 3 refills., Disp: 3 Tube, Rfl: 1 .  pantoprazole (PROTONIX) 40 MG tablet, Take 1 tablet by mouth twice daily, Disp: 30 tablet, Rfl: 0 .  venlafaxine XR (EFFEXOR XR) 150 MG 24 hr capsule, Take 2 capsules (300 mg total) by mouth daily with breakfast., Disp: 180 capsule, Rfl: 0 .  QUEtiapine (SEROQUEL) 25 MG tablet, Take 1-2 tablets (25-50 mg total) by mouth at bedtime as needed., Disp: 60 tablet, Rfl: 1 Medication Side Effects: none  Family Medical/ Social History: Changes? No  MENTAL HEALTH EXAM:  Last menstrual period 01/04/2010.There is no height or weight on file to calculate BMI.  General Appearance: Casual, Neat, Well Groomed and Obese  Eye Contact:  Good  Speech:  Clear and Coherent  Volume:  Normal  Mood:  Euthymic  Affect:  Appropriate  Thought Process:  Goal Directed and Descriptions of Associations: Intact  Orientation:  Full (Time, Place, and Person)  Thought Content: Logical   Suicidal Thoughts:  No  Homicidal Thoughts:  No  Memory:  WNL  Judgement:  Good  Insight:  Good  Psychomotor Activity:  Normal  Concentration:  Concentration: Good  Recall:  Good  Fund of Knowledge: Good  Language: Good  Assets:  Desire for Improvement  ADL's:  Intact  Cognition: WNL  Prognosis:  Good    DIAGNOSES:    ICD-10-CM   1. PTSD (post-traumatic stress disorder)  F43.10   2. Recurrent major depressive disorder, in partial remission ([place])  F33.41   3. Generalized anxiety disorder  F41.1     Receiving Psychotherapy: Yes  Carolyne Fiscal in past   RECOMMENDATIONS:  PDMP reviewed. I spent 30 minutes w/ her. We discussed doxazosin or prazosin or even seroquel.  She's willing to try something as needed.  Discussed low dose Seroquel prn to help w/ sleep and dreams. SE discussed, benefits and risks.  She accepts. Continue Effexor XR 150 mg, 2 qd. Continue Xanax 0.5 mg bid  prn. Continue B Complex. Get back in w/ Carolyne Fiscal if needed. Return in 6 weeks.   Donnal Moat, PA-C

## 2019-03-19 ENCOUNTER — Telehealth: Payer: Self-pay

## 2019-03-19 MED ORDER — PANTOPRAZOLE SODIUM 40 MG PO TBEC: 40.0000 mg | DELAYED_RELEASE_TABLET | Freq: Every day | ORAL | 1 refills | Status: DC

## 2019-03-19 NOTE — Addendum Note (Signed)
Addended by: Orma Flaming on: 03/19/2019 03:13 PM   Modules accepted: Orders

## 2019-03-19 NOTE — Telephone Encounter (Signed)
  LAST APPOINTMENT DATE: 02/11/2019   NEXT APPOINTMENT DATE:@3 /04/2019  MEDICATION:pantoprazole (PROTONIX) 40 MG tablet PHARMACY: [facility] [address], [place] [address] Phone:  [phone]  Fax:  [phone]      Patient states that she didn't realize that she was that low on medication. Patient only have 1 pill left   **Let patient know to contact pharmacy at the end of the day to make sure medication is ready. **  ** Please notify patient to allow 48-72 hours to process**  **Encourage patient to contact the pharmacy for refills or they can request refills through [facility]**  CLINICAL FILLS OUT ALL BELOW:   LAST REFILL:  QTY:  REFILL DATE:    OTHER COMMENTS:    Okay for refill?  Please advise

## 2019-03-24 ENCOUNTER — Encounter: Payer: Self-pay | Admitting: Family Medicine

## 2019-03-24 NOTE — Telephone Encounter (Signed)
LVM FOR PATIENT TO CALL BACK AND R/S HER APPT

## 2019-03-25 ENCOUNTER — Ambulatory Visit: Payer: BLUE CROSS/BLUE SHIELD | Admitting: Family Medicine

## 2019-03-31 ENCOUNTER — Ambulatory Visit (INDEPENDENT_AMBULATORY_CARE_PROVIDER_SITE_OTHER): Payer: BC Managed Care – PPO | Admitting: Family Medicine

## 2019-03-31 ENCOUNTER — Other Ambulatory Visit: Payer: Self-pay

## 2019-03-31 ENCOUNTER — Encounter: Payer: Self-pay | Admitting: Family Medicine

## 2019-03-31 VITALS — BP 120/70 | HR 85 | Temp 98.4°F | Ht 60.0 in | Wt 213.4 lb

## 2019-03-31 DIAGNOSIS — Z1231 Encounter for screening mammogram for malignant neoplasm of breast: Secondary | ICD-10-CM

## 2019-03-31 DIAGNOSIS — K219 Gastro-esophageal reflux disease without esophagitis: Secondary | ICD-10-CM | POA: Diagnosis not present

## 2019-03-31 DIAGNOSIS — F4310 Post-traumatic stress disorder, unspecified: Secondary | ICD-10-CM | POA: Diagnosis not present

## 2019-03-31 DIAGNOSIS — Z0000 Encounter for general adult medical examination without abnormal findings: Secondary | ICD-10-CM | POA: Diagnosis not present

## 2019-03-31 LAB — CBC WITH DIFFERENTIAL/PLATELET
Basophils Absolute: 0 10*3/uL (ref 0.0–0.1)
Basophils Relative: 0.1 % (ref 0.0–3.0)
Eosinophils Absolute: 0 10*3/uL (ref 0.0–0.7)
Eosinophils Relative: 0 % (ref 0.0–5.0)
HCT: 40.7 % (ref 36.0–46.0)
Hemoglobin: 13.8 g/dL (ref 12.0–15.0)
Lymphocytes Relative: 27.1 % (ref 12.0–46.0)
Lymphs Abs: 1.5 10*3/uL (ref 0.7–4.0)
MCHC: 34 g/dL (ref 30.0–36.0)
MCV: 89.4 fl (ref 78.0–100.0)
Monocytes Absolute: 0.3 10*3/uL (ref 0.1–1.0)
Monocytes Relative: 5.5 % (ref 3.0–12.0)
Neutro Abs: 3.7 10*3/uL (ref 1.4–7.7)
Neutrophils Relative %: 67.3 % (ref 43.0–77.0)
Platelets: 157 10*3/uL (ref 150.0–400.0)
RBC: 4.55 Mil/uL (ref 3.87–5.11)
RDW: 13.8 % (ref 11.5–15.5)
WBC: 5.5 10*3/uL (ref 4.0–10.5)

## 2019-03-31 LAB — COMPREHENSIVE METABOLIC PANEL
ALT: 123 U/L — ABNORMAL HIGH (ref 0–35)
AST: 155 U/L — ABNORMAL HIGH (ref 0–37)
Albumin: 3.9 g/dL (ref 3.5–5.2)
Alkaline Phosphatase: 93 U/L (ref 39–117)
BUN: 12 mg/dL (ref 6–23)
CO2: 26 mEq/L (ref 19–32)
Calcium: 9.1 mg/dL (ref 8.4–10.5)
Chloride: 106 mEq/L (ref 96–112)
Creatinine, Ser: 0.76 mg/dL (ref 0.40–1.20)
GFR: 77.36 mL/min (ref >60.00)
Glucose, Bld: 107 mg/dL — ABNORMAL HIGH (ref 70–99)
Potassium: 4.5 mEq/L (ref 3.5–5.1)
Sodium: 140 mEq/L (ref 135–145)
Total Bilirubin: 0.8 mg/dL (ref 0.2–1.2)
Total Protein: 6.8 g/dL (ref 6.0–8.3)

## 2019-03-31 LAB — VITAMIN D 25 HYDROXY (VIT D DEFICIENCY, FRACTURES): VITD: 42.12 ng/mL (ref 30.00–100.00)

## 2019-03-31 LAB — LIPID PANEL
Cholesterol: 180 mg/dL (ref 0–200)
HDL: 51.7 mg/dL (ref >39.00)
LDL Cholesterol: 112 mg/dL — ABNORMAL HIGH (ref 0–99)
NonHDL: 128.68
Total CHOL/HDL Ratio: 3
Triglycerides: 82 mg/dL (ref 0.0–149.0)
VLDL: 16.4 mg/dL (ref 0.0–40.0)

## 2019-03-31 LAB — HEMOGLOBIN A1C: Hgb A1c MFr Bld: 5.2 % (ref 4.6–6.5)

## 2019-03-31 LAB — TSH: TSH: 2.12 u[IU]/mL (ref 0.35–4.50)

## 2019-03-31 MED ORDER — PANTOPRAZOLE SODIUM 40 MG PO TBEC: 40.0000 mg | DELAYED_RELEASE_TABLET | Freq: Every day | ORAL | 1 refills | Status: DC

## 2019-03-31 NOTE — Progress Notes (Deleted)
Patient: Sandra Freeman MRN: [id number] DOB: 1960-03-10 PCP: Orma Flaming, MD     Subjective:  No chief complaint on file.   HPI: The patient is a 59 y.o. female who presents today for ***  Review of Systems  Allergies Patient is allergic to hydroxyzine.  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  Surgical History Patient  has a past surgical history that includes Tubal ligation; Knee surgery; Cataract extraction; Posterior cervical laminectomy; Knee arthroscopy; and Anterior fusion cervical spine.  Family History Pateint's family history includes Alcohol abuse in her father, maternal grandfather, and maternal grandmother; COPD in her mother; Cancer in her father and mother; Cirrhosis in her maternal grandmother; Depression in her cousin; Diabetes in her father, half-brother, half-brother, and paternal grandmother; Heart disease in her father, half-brother, maternal grandfather, and paternal grandfather.  Social History Patient  reports that she has never smoked. She has never used smokeless tobacco. She reports current alcohol use of about 1.0 standard drinks of alcohol per week. She reports that she does not use drugs.    Objective: There were no vitals filed for this visit.  There is no height or weight on file to calculate BMI.  Physical Exam     Assessment/plan:      No follow-ups on  file.     @AWME @ 03/31/2019

## 2019-03-31 NOTE — Patient Instructions (Addendum)
Schedule your pap smear and mammogram!  Call cologuard and see if I need to reorder this or if you can send in kit that you have from one year ago.    SLEEP For sleep, try ashwagandha 383m at night for sleep. May not be a miracle, but may help.  Exercise as well will help.    You have an appointment scheduled for: []   2D Mammogram  []   3D Mammogram  []   Bone Density   Call for appointment    Your appointment will at the following location  [x]   [facility]      [address]        []   [facility] [address] [place]  Make sure to wear two peace clothing  No lotions powders or deodorants the day of the appointment Make sure to bring picture ID and insurance card.  Bring list of medications you are currently taking including any supplements.     Preventive Care [id number] Old, Female Preventive care refers to visits with your health care provider and lifestyle choices that can promote health and wellness. This includes:  A yearly physical exam. This may also be called an annual well check.  Regular dental visits and eye exams.  Immunizations.  Screening for certain conditions.  Healthy lifestyle choices, such as eating a healthy diet, getting regular exercise, not using drugs or products that contain nicotine and tobacco, and limiting alcohol use. What can I expect for my preventive care visit? Physical exam Your health care provider will check your:  Height and weight. This may be used to calculate body mass index (BMI), which tells if you are at a healthy weight.  Heart rate and blood pressure.  Skin for abnormal spots. Counseling Your health care provider may ask you questions about your:  Alcohol, tobacco, and drug use.  Emotional well-being.  Home and relationship well-being.  Sexual activity.  Eating habits.  Work and work  eStatistician  Method of birth control.  Menstrual cycle.  Pregnancy history. What immunizations do I need?  Influenza (flu) vaccine  This is recommended every year. Tetanus, diphtheria, and pertussis (Tdap) vaccine  You may need a Td booster every 10 years. Varicella (chickenpox) vaccine  You may need this if you have not been vaccinated. Zoster (shingles) vaccine  You may need this after age 61 Measles, mumps, and rubella (MMR) vaccine  You may need at least one dose of MMR if you were born in 1957 or later. You may also need a second dose. Pneumococcal conjugate (PCV13) vaccine  You may need this if you have certain conditions and were not previously vaccinated. Pneumococcal polysaccharide (PPSV23) vaccine  You may need one or two doses if you smoke cigarettes or if you have certain conditions. Meningococcal conjugate (MenACWY) vaccine  You may need this if you have certain conditions. Hepatitis A vaccine  You may need this if you have certain conditions or if you travel or work in places where you may be exposed to hepatitis A. Hepatitis B vaccine  You may need this if you have certain conditions or if you travel or work in places where you may be exposed to hepatitis B. Haemophilus influenzae type b (Hib) vaccine  You may need this if you have certain conditions. Human papillomavirus (HPV) vaccine  If recommended by your health care provider, you may need three doses over 6 months. You may receive vaccines as individual doses or as more than one vaccine together in one shot (combination vaccines). Talk with your health care provider about the risks and benefits of combination vaccines. What tests do I need? Blood tests  Lipid and cholesterol levels. These may be checked every 5 years, or more frequently if you are over 61 years old.  Hepatitis C test.  Hepatitis B test. Screening  Lung cancer screening. You may have this screening every year starting at  age 62 if you have a 30-pack-year history of smoking and currently smoke or have quit within the past 15 years.  Colorectal cancer screening. All adults should have this screening starting at age 4 and continuing until age 41. Your health care provider may recommend screening at age 19 if you are at increased risk. You will have tests every 1-10 years, depending on your results and the type of screening test.  Diabetes screening. This is done by checking your blood sugar (glucose) after you have not eaten for a while (fasting). You may have this done every 1-3 years.  Mammogram. This may be done every 1-2 years. Talk with your health care provider about when you should start having regular mammograms. This may depend on whether you have a family history of breast cancer.  BRCA-related cancer screening. This may be done if you have a family history of breast, ovarian, tubal, or peritoneal cancers.  Pelvic exam and Pap test. This may be done every 3 years starting at age 68. Starting at age 70, this may be done every 5 years if you have a Pap test in combination with an HPV test. Other tests  Sexually transmitted disease (STD) testing.  Bone density scan. This is done to screen for osteoporosis. You may have this scan if you are at high risk for osteoporosis. Follow these instructions at home: Eating and drinking  Eat a diet that includes fresh fruits and vegetables, whole grains, lean protein, and low-fat dairy.  Take vitamin and mineral supplements as recommended by your health care provider.  Do not drink alcohol if: ? Your health care provider tells you not to drink. ? You are pregnant, may be pregnant, or are planning to become pregnant.  If you drink alcohol: ? Limit how much you have to 0-1 drink a day. ? Be aware of how much alcohol is in your drink. In the [place] one drink equals one 12 oz bottle of beer (355 mL), one 5 oz glass of wine (148 mL), or one 1 oz glass of hard liquor  (44 mL). Lifestyle  Take daily care of your teeth and gums.  Stay active. Exercise for at least 30 minutes on 5 or more days each week.  Do not use any products that contain nicotine or tobacco, such as cigarettes, e-cigarettes, and chewing tobacco. If you need help quitting, ask your health care provider.  If you are sexually active, practice safe sex. Use a condom or other form of birth control (contraception) in order to prevent pregnancy and STIs (sexually transmitted infections).  If told by your health care provider, take low-dose aspirin daily starting at age 53. What's next?  Visit your health care provider once a year for a well check visit.  Ask your health care provider how often you should have your eyes and teeth checked.  Stay up to date on all vaccines. This information is  not intended to replace advice given to you by your health care provider. Make sure you discuss any questions you have with your health care provider. Document Revised: 09/18/2017 Document Reviewed: 09/18/2017 Elsevier Patient Education  2020 [employer].

## 2019-03-31 NOTE — Progress Notes (Signed)
Patient: Sandra Freeman MRN: [id number] DOB: 10-18-1960 PCP: Orma Flaming, MD     Subjective:  Chief Complaint  Patient presents with  . Annual Exam  . Medication Refill    HPI: The patient is a 59 y.o. female who presents today for annual exam. She denies any changes to past medical history. There have been no recent hospitalizations. They are not following a well balanced diet and exercise plan. Weight has been increasing steadily. Her weight is depressing to her.   Breast cancer in her mother. No colon cancer in first degree relative.   PTSD/anxiety/depression: I started her on effexor a year ago and she is doing wonderful. I started her on effexor and referred her to psychiatry and she states it has been such a blessing. They did increase dosage to 34m/day.   There is no immunization history on file for this patient.  Colonoscopy: 2009, repeat in 10 year. Opted for cologuard Mammogram: Order placed  Pap smear: will schedule with GYN Declines immunizations.    Review of Systems  Constitutional: Negative for appetite change, chills and fatigue.  HENT: Negative for congestion, ear discharge, ear pain and facial swelling.   Eyes: Negative for redness and itching.  Respiratory: Negative for cough, chest tightness, shortness of breath and wheezing.   Cardiovascular: Negative for chest pain, palpitations and leg swelling.  Gastrointestinal: Negative for abdominal pain, constipation, diarrhea, nausea and vomiting.  Genitourinary: Negative for difficulty urinating.  Musculoskeletal: Negative for back pain and joint swelling.  Neurological: Negative for seizures, numbness and headaches.  Psychiatric/Behavioral: Negative for behavioral problems, confusion, decreased concentration and suicidal ideas.    Allergies Patient is allergic to hydroxyzine.  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  Surgical History Patient  has a past surgical history that includes Tubal ligation; Knee surgery; Cataract extraction; Posterior cervical laminectomy; Knee arthroscopy; and Anterior fusion cervical spine.  Family History Pateint's family history includes Alcohol abuse in her father, maternal grandfather, and maternal grandmother; COPD in her mother; Cancer in her father and mother; Cirrhosis in her maternal grandmother; Depression in her cousin; Diabetes in her father, half-brother, half-brother, and paternal grandmother; Heart disease in her father, half-brother, maternal grandfather, and paternal grandfather.  Social History Patient  reports that she has never smoked. She has never used smokeless tobacco. She reports current alcohol use of about 1.0 standard drinks of alcohol per week. She reports that she does not use drugs.    Objective: Vitals:   03/31/19 1010  BP: 120/70  Pulse: 85  Temp: 98.4 F (36.9 C)  TempSrc: Temporal  SpO2: 95%  Weight: 213 lb 6.4 oz (96.8 kg)  Height: 5' (1.524 m)    Body mass index is 41.68 kg/m.  Physical Exam Vitals reviewed.  Constitutional:      Appearance: Normal appearance. She is well-developed. She is obese.  HENT:     Head: Normocephalic and atraumatic.     Right Ear: Ear canal and external ear normal. There is impacted cerumen.     Left Ear: Ear canal and external ear normal. There is impacted cerumen.     Mouth/Throat:  Mouth: Mucous membranes are moist.   Eyes:     Extraocular Movements: Extraocular movements intact.     Conjunctiva/sclera: Conjunctivae normal.     Pupils: Pupils are equal, round, and reactive to light.  Neck:     Thyroid: No thyromegaly.     Vascular: No carotid bruit.  Cardiovascular:     Rate and Rhythm: Normal rate and regular rhythm.     Pulses: Normal pulses.     Heart sounds: Normal heart sounds. No murmur.  Pulmonary:     Effort: Pulmonary effort is normal.     Breath sounds: Normal breath sounds.  Abdominal:     General: Bowel sounds are normal. There is no distension.     Palpations: Abdomen is soft.     Tenderness: There is no abdominal tenderness.  Musculoskeletal:     Cervical back: Normal range of motion and neck supple.  Lymphadenopathy:     Cervical: No cervical adenopathy.  Skin:    General: Skin is warm and dry.     Findings: No rash.  Neurological:     General: No focal deficit present.     Mental Status: She is alert and oriented to person, place, and time.     Cranial Nerves: No cranial nerve deficit.     Coordination: Coordination normal.     Deep Tendon Reflexes: Reflexes normal.  Psychiatric:        Mood and Affect: Mood normal.        Behavior: Behavior normal.          Office Visit from 03/31/2019 in [facility]  PHQ-9 Total Score  10     GAD 7 : Generalized Anxiety Score 03/31/2019 03/02/2018 01/29/2018  Nervous, Anxious, on Edge 2 2 3   Control/stop worrying 2 2 3   Worry too much - different things 1 1 3   Trouble relaxing 3 2 2   Restless 0 0 1  Easily annoyed or irritable 0 2 2  Afraid - awful might happen 1 0 1  Total GAD 7 Score 9 9 15   Anxiety Difficulty Not difficult at all Very difficult Extremely difficult     Assessment/plan: 1. Annual physical exam Routine, fasting lab work today. Discussed all of her HM and need for mmg with her family history. Has cologuard at home, just hasn't sent in. Gave number to call to see if she can still use  this. Has gyn to do her pap smear and discussed that she will call them to set this up. Declines vaccinations. Encouraged exercise and weight loss and diet changes. She knows she needs to lose weight. Will see her back in one year or sooner as needed.  Patient counseling [x]    Nutrition: Stressed importance of moderation in sodium/caffeine intake, saturated fat and cholesterol, caloric balance, sufficient intake of fresh fruits, vegetables, fiber, calcium, iron, and 1 mg of folate supplement per day (for females capable of pregnancy).  [x]    Stressed the importance of regular exercise.   []    Substance Abuse: Discussed cessation/primary prevention of tobacco, alcohol, or other drug use; driving or other dangerous activities under the influence; availability of treatment for abuse.   [x]    Injury prevention: Discussed safety belts, safety helmets, smoke detector, smoking near bedding or upholstery.   [x]    Sexuality: Discussed sexually transmitted diseases, partner selection, use of condoms, avoidance of unintended pregnancy  and contraceptive alternatives.  [x]    Dental health: Discussed importance of regular tooth brushing, flossing, and dental  visits.  [x]    Health maintenance and immunizations reviewed. Please refer to Health maintenance section.    - CBC with Differential/Platelet - Comprehensive metabolic panel - Hemoglobin A1c - TSH - Lipid panel - VITAMIN D 25 Hydroxy (Vit-D Deficiency, Fractures)  2. Screening mammogram, encounter for  - MM Digital Screening; Future  3. Gastroesophageal reflux disease without esophagitis Doing well on daily ppi vs. Bid. Weight loss/exericse and diet changes discussed.   4. PTSD (post-traumatic stress disorder) Followed by psychiatry for her anxiety/ptsd. Doing remarkably better on the effexor that I started her on a year ago and very happy with her progress. Sleep is still an issue and was given Seroquel to try. She did not tolerate this and I  cautioned her with the weight gain and increased diabetes/cholesterol with this medication. Would suggest alternative. She doesn't like how it makes her feel regardless. Trial of ashwagandha and good sleep hygiene-exercise, etc. Has f/u with PA at psychiatry in case other medication is needed. Overall she is doing great though.    This visit occurred during the SARS-CoV-2 public health emergency.  Safety protocols were in place, including screening questions prior to the visit, additional usage of staff PPE, and extensive cleaning of exam room while observing appropriate contact time as indicated for disinfecting solutions.     Return in about 1 year (around 03/30/2020) for annual/check up .   Orma Flaming, MD [place] [facility]  03/31/2019

## 2019-04-01 ENCOUNTER — Other Ambulatory Visit: Payer: Self-pay | Admitting: Family Medicine

## 2019-04-01 DIAGNOSIS — R748 Abnormal levels of other serum enzymes: Secondary | ICD-10-CM

## 2019-04-05 ENCOUNTER — Ambulatory Visit: Payer: BC Managed Care – PPO | Admitting: Physician Assistant

## 2019-04-07 ENCOUNTER — Encounter: Payer: Self-pay | Admitting: Family Medicine

## 2019-04-08 ENCOUNTER — Ambulatory Visit
Admission: RE | Admit: 2019-04-08 | Discharge: 2019-04-08 | Disposition: A | Discharge status: Home / Self Care | Payer: BC Managed Care – PPO | Source: Ambulatory Visit | Attending: Family Medicine | Admitting: Family Medicine

## 2019-04-08 DIAGNOSIS — R748 Abnormal levels of other serum enzymes: Secondary | ICD-10-CM

## 2019-04-09 ENCOUNTER — Encounter: Payer: Self-pay | Admitting: Family Medicine

## 2019-04-09 DIAGNOSIS — K7581 Nonalcoholic steatohepatitis (NASH): Secondary | ICD-10-CM | POA: Insufficient documentation

## 2019-04-14 ENCOUNTER — Ambulatory Visit: Payer: BC Managed Care – PPO | Admitting: Physician Assistant

## 2019-04-21 ENCOUNTER — Encounter: Payer: Self-pay | Admitting: Family Medicine

## 2019-04-25 ENCOUNTER — Encounter: Payer: Self-pay | Admitting: Family Medicine

## 2019-04-26 ENCOUNTER — Other Ambulatory Visit: Payer: BC Managed Care – PPO

## 2019-04-26 ENCOUNTER — Encounter: Payer: Self-pay | Admitting: Family Medicine

## 2019-04-28 ENCOUNTER — Encounter: Payer: Self-pay | Admitting: Physician Assistant

## 2019-04-28 ENCOUNTER — Ambulatory Visit (INDEPENDENT_AMBULATORY_CARE_PROVIDER_SITE_OTHER): Payer: BC Managed Care – PPO | Admitting: Physician Assistant

## 2019-04-28 DIAGNOSIS — F4310 Post-traumatic stress disorder, unspecified: Secondary | ICD-10-CM

## 2019-04-28 DIAGNOSIS — F331 Major depressive disorder, recurrent, moderate: Secondary | ICD-10-CM | POA: Diagnosis not present

## 2019-04-28 DIAGNOSIS — F411 Generalized anxiety disorder: Secondary | ICD-10-CM | POA: Diagnosis not present

## 2019-04-28 MED ORDER — VENLAFAXINE HCL ER 150 MG PO CP24: 300.0000 mg | ORAL_CAPSULE | Freq: Every day | ORAL | 0 refills | Status: DC

## 2019-04-28 NOTE — Progress Notes (Signed)
[facility]  Patient ID: Sandra Freeman,  MRN: [id number]  PCP: Orma Flaming, MD  Date of Evaluation: 04/28/2019 Time spent:30 minutes  Chief Complaint:  Chief Complaint    Anxiety; Depression     Virtual Visit via Telephone Note  I connected with patient by a video enabled telemedicine application or telephone, with their informed consent, and verified patient privacy and that I am speaking with the correct person using two identifiers.  I am private, in my office and the patient is home.  I discussed the limitations, risks, security and privacy concerns of performing an evaluation and management service by telephone and the availability of in person appointments. I also discussed with the patient that there may be a patient responsible charge related to this service. The patient expressed understanding and agreed to proceed.   I discussed the assessment and treatment plan with the patient. The patient was provided an opportunity to ask questions and all were answered. The patient agreed with the plan and demonstrated an understanding of the instructions.   The patient was advised to call back or seek an in-person evaluation if the symptoms worsen or if the condition fails to improve as anticipated.  I provided 30 minutes of non-face-to-face time during this encounter.  HISTORY/CURRENT STATUS: HPI For f/u after starting Seroquel.  At [facility], we added Seroquel for nightmares and PTSD. Couldn't take it b/c it made her really dizzy. But nightmares are a lot better though.   Doesn't feel depressed but she doesn't dress up or even put on make up anymore if she goes anywhere.  That is not like her at all.  Wants to stay home all the time.  Doesn't even want to go out and set in the sun at home. Appetite has not changed.  No extreme sadness, tearfulness, or feelings of hopelessness.  Denies any changes in concentration, making decisions or remembering things.  Denies suicidal or  homicidal thoughts.  Patient denies increased energy with decreased need for sleep, no increased talkativeness, no racing thoughts, no impulsivity or risky behaviors, no increased spending, no increased libido, no grandiosity, no increased irritability or anger, and no hallucinations.  Anxiety isn't bad but she does feel a little SOB if she has to go out. It goes away quickly.  Doesn't take the Xanax often. 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   She has recently had elevated LFTs.  Her PCP is doing further labs to evaluate that.  Denies dizziness, syncope, seizures, numbness, tingling, tremor, tics, unsteady gait, slurred speech, confusion. Denies muscle or joint pain, stiffness, or dystonia.  Individual Medical History/ Review of Systems: Changes? :No    Past medications for mental health diagnoses include: Wellbutrin, Effexor (for a few months) It's helpful, Xanax, Valium,hydroxyzine was ineffective and caused her to be sedated for several days, Seroquel caused severe dizziness.  Allergies: Hydroxyzine and Seroquel [quetiapine]  Current Medications:  Current Outpatient Medications:  .  albuterol (PROVENTIL HFA;VENTOLIN HFA) 108 (90 Base) MCG/ACT inhaler, Inhale into the lungs every 6 (six) hours as needed for wheezing or shortness of breath., Disp: , Rfl:  .  ALPRAZolam (XANAX) 0.5 MG tablet, Take 0.5-1 tablets (0.25-0.5 mg total) by mouth 2 (two) times daily as needed for anxiety., Disp: 30 tablet, Rfl: 1 .  clotrimazole-betamethasone (LOTRISONE) cream, Apply 1 application topically 2 (two) times daily., Disp: 30 g, Rfl: 3 .  diclofenac sodium (VOLTAREN) 1 % GEL, Apply 3 gm to 3 large joints up to  3 times a day.Dispense 3 tubes with 3 refills., Disp: 3 Tube, Rfl: 1 .  pantoprazole (PROTONIX) 40 MG tablet, Take 1 tablet (40 mg total) by mouth daily., Disp: 90 tablet, Rfl: 1 .  venlafaxine XR (EFFEXOR XR) 150 MG 24 hr capsule, Take 2 capsules (300 mg total) by mouth daily with  breakfast., Disp: 180 capsule, Rfl: 0 .  B Complex-C (B-COMPLEX WITH VITAMIN C) tablet, Take 1 tablet by mouth daily., Disp: , Rfl:  .  QUEtiapine (SEROQUEL) 25 MG tablet, Take 1-2 tablets (25-50 mg total) by mouth at bedtime as needed. (Patient not taking: Reported on 04/28/2019), Disp: 60 tablet, Rfl: 1 Medication Side Effects: none  Family Medical/ Social History: Changes? No  MENTAL HEALTH EXAM:  Last menstrual period 01/04/2010.There is no height or weight on file to calculate BMI.  General Appearance: unable to assess  Eye Contact:  unable to assess  Speech:  Clear and Coherent  Volume:  Normal  Mood:  Euthymic  Affect:  Appropriate  Thought Process:  Goal Directed and Descriptions of Associations: Intact  Orientation:  Full (Time, Place, and Person)  Thought Content: Logical   Suicidal Thoughts:  No  Homicidal Thoughts:  No  Memory:  WNL  Judgement:  Good  Insight:  Good  Psychomotor Activity:  unable to assess  Concentration:  Concentration: Good  Recall:  Good  Fund of Knowledge: Good  Language: Good  Assets:  Desire for Improvement  ADL's:  Intact  Cognition: WNL  Prognosis:  Good    DIAGNOSES:    ICD-10-CM   1. Major depressive disorder, recurrent episode, moderate (HCC)  F33.1   2. PTSD (post-traumatic stress disorder)  F43.10   3. Generalized anxiety disorder  F41.1     Receiving Psychotherapy: Yes  Carolyne Fiscal in past   RECOMMENDATIONS:  PDMP reviewed. I spent 30 minutes w/ her. We discussed the different options for depression.  I think she has a combination of depression and anxiety that are equally bothering her.  Initially I recommended that we add Abilify to the Effexor and Xanax in hopes to help boost her mood, give her more energy.  But with the information of elevated LFTs, I do not want to add something that may increase her weight which then may affect the liver, for example if she is found to have fatty liver.  We decided to wait before we add  anything and see what the work-up for her elevated LFTs show. I have encouraged her to take the Xanax more often.  If she is going outside for any reason, especially if out in public to the grocery store or what ever, go ahead and take at least half of the Xanax.  That will help with the anxiety in that situation and then hopefully she will not be as nervous about it in the future.  She agrees to this plan for now and then will reevaluate in a month. Continue Effexor XR 150 mg, 2 p.o. daily. Continue Xanax 0.5 mg, 1/2-1 twice daily as needed. Continue B complex. Return in 4 weeks.   Donnal Moat, PA-C

## 2019-05-07 ENCOUNTER — Other Ambulatory Visit: Payer: Self-pay

## 2019-05-07 ENCOUNTER — Encounter: Payer: Self-pay | Admitting: Family Medicine

## 2019-05-07 MED ORDER — BENZONATATE 200 MG PO CAPS: 200.0000 mg | ORAL_CAPSULE | Freq: Three times a day (TID) | ORAL | 1 refills | Status: DC | PRN

## 2019-05-09 ENCOUNTER — Encounter: Payer: Self-pay | Admitting: Family Medicine

## 2019-05-10 ENCOUNTER — Encounter: Payer: Self-pay | Admitting: Family Medicine

## 2019-05-13 ENCOUNTER — Encounter: Payer: Self-pay | Admitting: Family Medicine

## 2019-05-13 ENCOUNTER — Other Ambulatory Visit: Payer: Self-pay | Admitting: Physician Assistant

## 2019-05-13 ENCOUNTER — Telehealth (INDEPENDENT_AMBULATORY_CARE_PROVIDER_SITE_OTHER): Payer: BC Managed Care – PPO | Admitting: Family Medicine

## 2019-05-13 VITALS — Temp 99.5°F | Ht 60.0 in | Wt 213.0 lb

## 2019-05-13 DIAGNOSIS — U071 COVID-19: Secondary | ICD-10-CM

## 2019-05-13 DIAGNOSIS — K7581 Nonalcoholic steatohepatitis (NASH): Secondary | ICD-10-CM

## 2019-05-13 DIAGNOSIS — J4520 Mild intermittent asthma, uncomplicated: Secondary | ICD-10-CM

## 2019-05-13 MED ORDER — ALBUTEROL SULFATE HFA 108 (90 BASE) MCG/ACT IN AERS: 2.0000 | INHALATION_SPRAY | RESPIRATORY_TRACT | 1 refills | Status: DC | PRN

## 2019-05-13 MED ORDER — PREDNISONE 20 MG PO TABS: 40.0000 mg | ORAL_TABLET | Freq: Every day | ORAL | 0 refills | Status: DC

## 2019-05-13 MED ORDER — SODIUM CHLORIDE 0.9 % IV SOLN
Freq: Once | INTRAVENOUS | Status: AC
  Filled 2019-05-13: qty 700

## 2019-05-13 NOTE — Progress Notes (Signed)
  I connected by phone with [name] on 05/13/2019 at 2:17 PM to discuss the potential use of an new treatment for mild to moderate COVID-19 viral infection in non-hospitalized patients.  This patient is a 59 y.o. female that meets the FDA criteria for Emergency Use Authorization of bamlanivimab/etesevimab or casirivimab/imdevimab.  Has a (+) direct SARS-CoV-2 viral test result  Has mild or moderate COVID-19   Is ? 59 years of age and weighs ? 40 kg  Is NOT hospitalized due to COVID-19  Is NOT requiring oxygen therapy or requiring an increase in baseline oxygen flow rate due to COVID-19  Is within 10 days of symptom onset  Has at least one of the high risk factor(s) for progression to severe COVID-19 and/or hospitalization as defined in EUA.  Specific high risk criteria : BMI >/= 35   Hx of Asthma   I have spoken and communicated the following to the patient or parent/caregiver:  1. FDA has authorized the emergency use of bamlanivimab/etesevimab and casirivimab\imdevimab for the treatment of mild to moderate COVID-19 in adults and pediatric patients with positive results of direct SARS-CoV-2 viral testing who are 5 years of age and older weighing at least 40 kg, and who are at high risk for progressing to severe COVID-19 and/or hospitalization.  2. The significant known and potential risks and benefits of bamlanivimab/etesevimab and casirivimab\imdevimab, and the extent to which such potential risks and benefits are unknown.  3. Information on available alternative treatments and the risks and benefits of those alternatives, including clinical trials.  4. Patients treated with bamlanivimab/etesevimab and casirivimab\imdevimab should continue to self-isolate and use infection control measures (e.g., wear mask, isolate, social distance, avoid sharing personal items, clean and disinfect "high touch" surfaces, and frequent handwashing) according to CDC guidelines.   5. The patient  or parent/caregiver has the option to accept or refuse bamlanivimab/etesevimab or casirivimab\imdevimab .  After reviewing this information with the patient, The patient agreed to proceed with receiving the bamlanimivab infusion and will be provided a copy of the Fact sheet prior to receiving the infusion.  Crista Luria Hershell Brandl 05/13/2019 2:17 PM

## 2019-05-13 NOTE — Progress Notes (Signed)
Virtual Visit via Video   Due to the COVID-19 pandemic, this visit was completed with telemedicine (audio/video) technology to reduce patient and provider exposure as well as to preserve personal protective equipment.   I connected with Sandra Freeman by a video enabled telemedicine application and verified that I am speaking with the correct person using two identifiers. Location patient: Home Location provider: [facility], Office Persons participating in the virtual visit: Sandra Freeman, Sandra Flaming, MD and her son Sandra Freeman.   I discussed the limitations of evaluation and management by telemedicine and the availability of in person appointments. The patient expressed understanding and agreed to proceed.  Care Team   Patient Care Team: Sandra Flaming, MD as PCP - General (Family Medicine) Sandra Mull, MD as Consulting Physician (Sports Medicine)  Subjective:   HPI:   [name] started to have symptoms of URI on Sunday. She woke up with congestion, head cold, coughing and was hit with everything all at one time. She thinks she has had fever since Sunday, but just recently got a thermometer and had a 101.5 yesterday and this AM. She has some shortness of breath, but is not struggling to breath. Her covid test came back positive this AM. She does not have a pulse ox. She has co morbidities of asthma, obesity and NASH. She is taking mucinex and needs a refill of her inhaler. Denies wheezing, retractions. She can talk in complete sentences and is comfortable. Her son is with her. She is able to eat and drink, but she has a decreased appetite.   Review of Systems  Constitutional: Positive for fever. Negative for chills.  HENT: Positive for congestion and sore throat.   Respiratory: Positive for cough and shortness of breath. Negative for wheezing.   Cardiovascular: Negative for chest pain and palpitations.  Neurological: Negative for dizziness and headaches.     Patient Active  Problem List   Diagnosis Date Noted  . NASH (nonalcoholic steatohepatitis) 04/09/2019  . PTSD (post-traumatic stress disorder) 01/29/2018  . DDD (degenerative disc disease), cervical 03/28/2017  . DDD (degenerative disc disease), lumbar 03/28/2017  . FATTY LIVER DISEASE 06/17/2007  . Anxiety state 06/02/2007  . ASCUS PAP 04/23/2007  . 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 Allergic rhinitis 02/06/2007  . Asthma 02/06/2007  . GERD 02/06/2007    Social History   Tobacco Use  . Smoking status: Never Smoker  . Smokeless tobacco: Never Used  Substance Use Topics  . Alcohol use: Yes    Alcohol/week: 1.0 standard drinks    Types: 1 Cans of beer per week    Comment: rare    Current Outpatient Medications:  .  albuterol (VENTOLIN HFA) 108 (90 Base) MCG/ACT inhaler, Inhale 2 puffs into the lungs every 4 (four) hours as needed for wheezing or shortness of breath., Disp: 6.7 g, Rfl: 1 .  ALPRAZolam (XANAX) 0.5 MG tablet, Take 0.5-1 tablets (0.25-0.5 mg total) by mouth 2 (two) times daily as needed for anxiety., Disp: 30 tablet, Rfl: 1 .  benzonatate (TESSALON) 200 MG capsule, Take 1 capsule (200 mg total) by mouth 3 (three) times daily as needed., Disp: 20 capsule, Rfl: 1 .  pantoprazole (PROTONIX) 40 MG tablet, Take 1 tablet (40 mg total) by mouth daily., Disp: 90 tablet, Rfl: 1 .  venlafaxine XR (EFFEXOR XR) 150 MG 24 hr capsule, Take 2 capsules (300 mg total) by mouth daily with breakfast., Disp: 180 capsule, Rfl: 0 .  B Complex-C (B-COMPLEX  WITH VITAMIN C) tablet, Take 1 tablet by mouth daily., Disp: , Rfl:  .  clotrimazole-betamethasone (LOTRISONE) cream, Apply 1 application topically 2 (two) times daily. (Patient not taking: Reported on 05/13/2019), Disp: 30 g, Rfl: 3 .  diclofenac sodium (VOLTAREN) 1 % GEL, Apply 3 gm to 3 large joints up to 3 times a day.Dispense 3 tubes with 3 refills. (Patient not taking: Reported on 05/13/2019), Disp: 3 Tube, Rfl: 1 .  predniSONE  (DELTASONE) 20 MG tablet, Take 2 tablets (40 mg total) by mouth daily with breakfast., Disp: 10 tablet, Rfl: 0  Allergies  Allergen Reactions  . Hydroxyzine Other (See Comments)    Extreme sedation  . Seroquel [Quetiapine] Other (See Comments)    dizzy    Objective:   VITALS: Per patient if applicable, see vitals. GENERAL: Alert, appears well and in no acute distress. Sitting comfortably on couch.  HEENT: Atraumatic, conjunctiva clear, no obvious abnormalities on inspection of external nose and ears. NECK: Normal movements of the head and neck. CARDIOPULMONARY: No increased WOB. Speaking in clear sentences. I:E ratio WNL. Harsh, wet cough throughout out appointment. No visible retractions  MS: Moves all visible extremities without noticeable abnormality. PSYCH: Pleasant and cooperative, well-groomed. Speech normal rate and rhythm. Affect is appropriate. Insight and judgement are appropriate. Attention is focused, linear, and appropriate.  NEURO: CN grossly intact. Oriented as arrived to appointment on time with no prompting. Moves both UE equally.  SKIN: No obvious lesions, wounds, erythema, or cyanosis noted on face or hands.  Depression screen [facility] 2/9 03/31/2019 03/02/2018 01/29/2018  Decreased Interest 2 2 3   Down, Depressed, Hopeless 2 3 3   PHQ - 2 Score 4 5 6   Altered sleeping 2 2 3   Tired, decreased energy 2 1 2   Change in appetite 0 1 3  Feeling bad or failure about yourself  0 2 3  Trouble concentrating 2 1 1   Moving slowly or fidgety/restless 0 0 0  Suicidal thoughts 0 0 0  PHQ-9 Score 10 12 18   Difficult doing work/chores Somewhat difficult Very difficult Extremely dIfficult  Some encounter information is confidential and restricted. Go to Review Flowsheets activity to see all data.    Assessment and Plan:   Sandra Freeman was seen today for covid 19 infection.   Diagnoses and all orders for this visit:  COVID-19 -candidate for possible monoclonal antibody infusion. Will  call and try to get her set up for this.  -albuterol inhaler q 4 hours, steroid burst, cool mist humidifier, anti-pyretics. Discussed over the counter cough medicines as well -recommended pulse ox and to go to ER if oxygen less than 92-94%.  -very strict respiratory distress symptoms reviewed with her and son -ER precautions given in detail.  -also ordered [phone] home monitoring program on mychart.   Other orders -     predniSONE (DELTASONE) 20 MG tablet; Take 2 tablets (40 mg total) by mouth daily with breakfast. -     albuterol (VENTOLIN HFA) 108 (90 Base) MCG/ACT inhaler; Inhale 2 puffs into the lungs every 4 (four) hours as needed for wheezing or shortness of breath.    [name] Kitchen COVID-19 Education: The signs and symptoms of COVID-19 were discussed with the patient and how to seek care for testing if needed. The importance of social distancing was discussed today. . Reviewed expectations re: course of current medical issues. . Discussed self-management of symptoms. . Outlined signs and symptoms indicating need for more acute intervention. . Patient verbalized understanding and all questions were  answered. [name] Kitchen Health Maintenance issues including appropriate healthy diet, exercise, and smoking avoidance were discussed with patient. . See orders for this visit as documented in the electronic medical record.  Sandra Flaming, MD  Records requested if needed. Time spent: 20 minutes, of which >50% was spent in obtaining information about her symptoms, reviewing her previous labs, evaluations, and treatments, counseling her about her condition (please see the discussed topics above), and developing a plan to further investigate it; she had a number of questions which I addressed.

## 2019-05-14 ENCOUNTER — Ambulatory Visit (HOSPITAL_COMMUNITY)
Admission: RE | Admit: 2019-05-14 | Discharge: 2019-05-14 | Disposition: A | Discharge status: Home / Self Care | Payer: BC Managed Care – PPO | Source: Ambulatory Visit | Attending: Pulmonary Disease | Admitting: Pulmonary Disease

## 2019-05-14 DIAGNOSIS — U071 COVID-19: Secondary | ICD-10-CM | POA: Insufficient documentation

## 2019-05-14 MED ORDER — ALBUTEROL SULFATE HFA 108 (90 BASE) MCG/ACT IN AERS: 2.0000 | INHALATION_SPRAY | Freq: Once | RESPIRATORY_TRACT | Status: DC | PRN

## 2019-05-14 MED ORDER — EPINEPHRINE 0.3 MG/0.3ML IJ SOAJ: 0.3000 mg | Freq: Once | INTRAMUSCULAR | Status: DC | PRN

## 2019-05-14 MED ORDER — FAMOTIDINE IN NACL 20-0.9 MG/50ML-% IV SOLN: 20.0000 mg | Freq: Once | INTRAVENOUS | Status: DC | PRN

## 2019-05-14 MED ORDER — DIPHENHYDRAMINE HCL 50 MG/ML IJ SOLN: 50.0000 mg | Freq: Once | INTRAMUSCULAR | Status: DC | PRN

## 2019-05-14 MED ORDER — SODIUM CHLORIDE 0.9 % IV SOLN: INTRAVENOUS | Status: DC | PRN

## 2019-05-14 MED ORDER — METHYLPREDNISOLONE SODIUM SUCC 125 MG IJ SOLR: 125.0000 mg | Freq: Once | INTRAMUSCULAR | Status: DC | PRN

## 2019-05-14 NOTE — Progress Notes (Signed)
  Diagnosis: COVID-19  Physician:wright  Procedure: [facility] Med: bamlanivimab\etesevimab infusion - Provided patient with bamlanimivab\etesevimab fact sheet for patients, parents and caregivers prior to infusion.  Complications: No immediate complications noted.  Discharge: Discharged home   Heide Scales 05/14/2019

## 2019-05-14 NOTE — Discharge Instructions (Signed)

## 2019-05-15 ENCOUNTER — Other Ambulatory Visit (HOSPITAL_COMMUNITY): Payer: Self-pay

## 2019-05-17 ENCOUNTER — Encounter: Payer: Self-pay | Admitting: Family Medicine

## 2019-05-18 ENCOUNTER — Telehealth (INDEPENDENT_AMBULATORY_CARE_PROVIDER_SITE_OTHER): Payer: BC Managed Care – PPO | Admitting: Family Medicine

## 2019-05-18 ENCOUNTER — Encounter: Payer: Self-pay | Admitting: Family Medicine

## 2019-05-18 VITALS — Temp 98.8°F

## 2019-05-18 DIAGNOSIS — R05 Cough: Secondary | ICD-10-CM | POA: Diagnosis not present

## 2019-05-18 DIAGNOSIS — U071 COVID-19: Secondary | ICD-10-CM

## 2019-05-18 DIAGNOSIS — J45901 Unspecified asthma with (acute) exacerbation: Secondary | ICD-10-CM

## 2019-05-18 DIAGNOSIS — R059 Cough, unspecified: Secondary | ICD-10-CM

## 2019-05-18 MED ORDER — DOXYCYCLINE HYCLATE 100 MG PO TABS: 100.0000 mg | ORAL_TABLET | Freq: Two times a day (BID) | ORAL | 0 refills | Status: DC

## 2019-05-18 MED ORDER — PREDNISONE 10 MG PO TABS: ORAL_TABLET | ORAL | 0 refills | Status: DC

## 2019-05-18 NOTE — Telephone Encounter (Signed)
Pt is having trouble with contacting our office. Please reach out to her to schedule. Thanks

## 2019-05-18 NOTE — Progress Notes (Signed)
Virtual Visit via Video Note  I connected with Sandra Freeman  on 05/18/19 at 10:00 AM EDT by a video enabled telemedicine application and verified that I am speaking with the correct person using two identifiers.  Location patient: home, [place] Location provider:work or home office Persons participating in the virtual visit: patient, provider, her son Sandra Freeman Gave  I discussed the limitations of evaluation and management by telemedicine and the availability of in person appointments. The patient expressed understanding and agreed to proceed.   HPI:  Acute visit for COVID19: -started 9 days ago -symptoms include fever initially, cough, nasal congestion, SOB at times -did mab infusion 4 days ago -no fever for several days, O2 97 - she is monitoring at home, she is not having any chest pain  -worst symptoms include the cough, she has some wheezing, feels like a typically asthma flare to her at this point, reports the cough is a wet cough now productive of yellow mucus - she has some SOB when she coughs but is comfortable at rest -she reports typically uses codeine cough syrup, antibiotic and breathing treatments when has asthma, also prednisone -she took prednisone course with this for 5 days, but now finishes 2 days ago and son feels is worse now -good appetite -she is able to get up and around the house -reports at baseline now only uses inhalers as needed -used alb once today -has tessalon for cough -had not had any covid vaccines   ROS: See pertinent positives and negatives per HPI.  Past Medical History:  Diagnosis Date  . ALLERGIC RHINITIS 02/06/2007  . ANEMIA, IRON DEFICIENCY 06/17/2007  . ANXIETY DISORDER 06/02/2007  . ASCUS PAP 04/23/2007  . ASTHMA 02/06/2007  . DEPRESSION 02/06/2007  . DIZZINESS 02/13/2007  . Essential hypertension, benign 04/16/2007  . FATTY LIVER DISEASE 06/17/2007  . Gastroparesis 10/21/2008  . GERD 02/06/2007  . HYPERTENSION 03/18/2008  . HYPOTHYROIDISM 02/06/2007  .  LIVER FUNCTION TESTS, ABNORMAL 02/10/2007  . NONSPEC ELEVATION OF LEVELS OF TRANSAMINASE/LDH 06/02/2007  . OBESITY 02/06/2007  . 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 WRIST SPRAIN, RIGHT 05/23/2008    Past Surgical History:  Procedure Laterality Date  . ANTERIOR FUSION CERVICAL SPINE    . CATARACT EXTRACTION    . KNEE ARTHROSCOPY     left  . KNEE SURGERY    . POSTERIOR CERVICAL LAMINECTOMY    . TUBAL LIGATION      Family History  Problem Relation Age of Onset  . Cancer Mother        breast  . COPD Mother   . Diabetes Father   . Cancer Father        lung  . Heart disease Father   . Alcohol abuse Father   . Diabetes Half-Brother   . Heart disease Half-Brother   . Diabetes Half-Brother   . Heart disease Maternal Grandfather   . Alcohol abuse Maternal Grandfather   . Alcohol abuse Maternal Grandmother   . Cirrhosis Maternal Grandmother   . Heart disease Paternal Grandfather   . Diabetes Paternal Grandmother   . Depression Cousin     SOCIAL HX: see hpi   Current Outpatient Medications:  .  albuterol (VENTOLIN HFA) 108 (90 Base) MCG/ACT inhaler, Inhale 2 puffs into the lungs every 4 (four) hours as needed for wheezing or shortness of breath., Disp: 6.7 g, Rfl: 1 .  ALPRAZolam (XANAX) 0.5 MG tablet, Take 0.5-1 tablets (0.25-0.5 mg total) by mouth 2 (two) times daily as needed for anxiety., Disp: 30  tablet, Rfl: 1 .  benzonatate (TESSALON) 200 MG capsule, Take 1 capsule (200 mg total) by mouth 3 (three) times daily as needed., Disp: 20 capsule, Rfl: 1 .  clotrimazole-betamethasone (LOTRISONE) cream, Apply 1 application topically 2 (two) times daily., Disp: 30 g, Rfl: 3 .  diclofenac sodium (VOLTAREN) 1 % GEL, Apply 3 gm to 3 large joints up to 3 times a day.Dispense 3 tubes with 3 refills., Disp: 3 Tube, Rfl: 1 .  pantoprazole (PROTONIX) 40 MG tablet, Take 1 tablet (40 mg total) by mouth daily., Disp: 90 tablet, Rfl: 1 .  venlafaxine XR (EFFEXOR XR) 150 MG 24 hr capsule, Take 2 capsules  (300 mg total) by mouth daily with breakfast., Disp: 180 capsule, Rfl: 0 .  doxycycline (VIBRA-TABS) 100 MG tablet, Take 1 tablet (100 mg total) by mouth 2 (two) times daily., Disp: 14 tablet, Rfl: 0 .  predniSONE (DELTASONE) 10 MG tablet, 38m (4 tabs) x1 days, 269m(2 tabs) x 2 days, then 1049m 2 days, Disp: 10 tablet, Rfl: 0  EXAM:  VITALS per patient if applicable: denies fever, reports O2 97%  GENERAL: alert, oriented, appears well and in no acute distress  HEENT: atraumatic, conjunttiva clear, no obvious abnormalities on inspection of external nose and ears  NECK: normal movements of the head and neck  LUNGS: on inspection no signs of respiratory distress, breathing rate appears normal, no obvious gross SOB, gasping or wheezing, occasional cough during the visit  CV: no obvious cyanosis  MS: moves all visible extremities without noticeable abnormality  PSYCH/NEURO: pleasant and cooperative, no obvious depression or anxiety, speech and thought processing grossly intact  ASSESSMENT AND PLAN:  Discussed the following assessment and plan:  COVID-19  Asthma with acute exacerbation, unspecified asthma severity, unspecified whether persistent  Cough  -we discussed possible serious and likely etiologies, options for evaluation and workup, limitations of telemedicine visit vs in person visit, treatment, treatment risks and precautions. Pt prefers to treat via telemedicine empirically rather then risking or undertaking an in person visit at this moment. Discussed various courses of [id number] complications, treatment options. She has opted to try addition of doxy 100m43md, extendand taper prednisone for a few more days, alb as need q 4-6 hours, tessalon perles as needed. Patient agrees to seek prompt in person care if worsening, new symptoms arise, or if is not improving with treatment.   I discussed the assessment and treatment plan with the patient. The patient was provided  an opportunity to ask questions and all were answered. The patient agreed with the plan and demonstrated an understanding of the instructions.   The patient was advised to call back or seek an in-person evaluation if the symptoms worsen or if the condition fails to improve as anticipated.   HannLucretia Freeman

## 2019-05-20 ENCOUNTER — Encounter: Payer: Self-pay | Admitting: Family Medicine

## 2019-05-20 MED ORDER — AZITHROMYCIN 250 MG PO TABS: ORAL_TABLET | ORAL | 0 refills | Status: DC

## 2019-05-20 NOTE — Telephone Encounter (Signed)
FYI

## 2019-06-01 ENCOUNTER — Encounter: Payer: Self-pay | Admitting: Family Medicine

## 2019-06-01 NOTE — Telephone Encounter (Signed)
FYI

## 2019-06-02 ENCOUNTER — Encounter: Payer: Self-pay | Admitting: Family Medicine

## 2019-06-11 ENCOUNTER — Encounter: Payer: Self-pay | Admitting: Family Medicine

## 2019-06-17 ENCOUNTER — Ambulatory Visit (INDEPENDENT_AMBULATORY_CARE_PROVIDER_SITE_OTHER): Payer: BC Managed Care – PPO

## 2019-06-17 ENCOUNTER — Encounter: Payer: Self-pay | Admitting: Family Medicine

## 2019-06-17 ENCOUNTER — Other Ambulatory Visit: Payer: Self-pay

## 2019-06-17 ENCOUNTER — Ambulatory Visit (INDEPENDENT_AMBULATORY_CARE_PROVIDER_SITE_OTHER): Payer: BC Managed Care – PPO | Admitting: Family Medicine

## 2019-06-17 VITALS — BP 130/82 | HR 94 | Temp 98.3°F | Ht 60.0 in

## 2019-06-17 DIAGNOSIS — R05 Cough: Secondary | ICD-10-CM

## 2019-06-17 DIAGNOSIS — R059 Cough, unspecified: Secondary | ICD-10-CM

## 2019-06-17 DIAGNOSIS — U071 COVID-19: Secondary | ICD-10-CM | POA: Diagnosis not present

## 2019-06-17 MED ORDER — GUAIFENESIN-CODEINE 100-10 MG/5ML PO SOLN: 10.0000 mL | Freq: Four times a day (QID) | ORAL | 0 refills | Status: DC | PRN

## 2019-06-17 NOTE — Patient Instructions (Signed)
Im so glad you are doing better! Sending in codeine cough syrup for you, sound now more like typical bronchitis, but likely due to covid.  Continue honey, cool mist humidifier and tessalon pearls.    Acute Bronchitis, Adult  Acute bronchitis is when air tubes in the lungs (bronchi) suddenly get swollen. The condition can make it hard for you to breathe. In adults, acute bronchitis usually goes away within 2 weeks. A cough caused by bronchitis may last up to 3 weeks. Smoking, allergies, and asthma can make the condition worse. What are the causes? This condition is caused by:  Cold and flu viruses. The most common cause of this condition is the virus that causes the common cold.  Bacteria.  Substances that irritate the lungs, including: ? Smoke from cigarettes and other types of tobacco. ? Dust and pollen. ? Fumes from chemicals, gases, or burned fuel. ? Other materials that pollute indoor or outdoor air.  Close contact with someone who has acute bronchitis. What increases the risk? The following factors may make you more likely to develop this condition:  A weak body's defense system. This is also called the immune system.  Any condition that affects your lungs and breathing, such as asthma. What are the signs or symptoms? Symptoms of this condition include:  A cough.  Coughing up clear, yellow, or green mucus.  Wheezing.  Chest congestion.  Shortness of breath.  A fever.  Body aches.  Chills.  A sore throat. How is this treated? Acute bronchitis may go away over time without treatment. Your doctor may recommend:  Drinking more fluids.  Taking a medicine for a fever or cough.  Using a device that gets medicine into your lungs (inhaler).  Using a vaporizer or a humidifier. These are machines that add water or moisture in the air to help with coughing and poor breathing. Follow these instructions at home:  Activity  Get a lot of rest.  Avoid places where  there are fumes from chemicals.  Return to your normal activities as told by your doctor. Ask your doctor what activities are safe for you. Lifestyle  Drink enough fluids to keep your pee (urine) pale yellow.  Do not drink alcohol.  Do not use any products that contain nicotine or tobacco, such as cigarettes, e-cigarettes, and chewing tobacco. If you need help quitting, ask your doctor. Be aware that: ? Your bronchitis will get worse if you smoke or breathe in other people's smoke (secondhand smoke). ? Your lungs will heal faster if you quit smoking. General instructions  Take over-the-counter and prescription medicines only as told by your doctor.  Use an inhaler, cool mist vaporizer, or humidifier as told by your doctor.  Rinse your mouth often with salt water. To make salt water, dissolve -1 tsp (3-6 g) of salt in 1 cup (237 mL) of warm water.  Keep all follow-up visits as told by your doctor. This is important. How is this prevented? To lower your risk of getting this condition again:  Wash your hands often with soap and water. If soap and water are not available, use hand sanitizer.  Avoid contact with people who have cold symptoms.  Try not to touch your mouth, nose, or eyes with your hands.  Make sure to get the flu shot every year. Contact a doctor if:  Your symptoms do not get better in 2 weeks.  You vomit more than once or twice.  You have symptoms of loss of fluid from  your body (dehydration). These include: ? Dark urine. ? Dry skin or eyes. ? Increased thirst. ? Headaches. ? Confusion. ? Muscle cramps. Get help right away if:  You cough up blood.  You have chest pain.  You have very bad shortness of breath.  You become dehydrated.  You faint or keep feeling like you are going to faint.  You keep vomiting.  You have a very bad headache.  Your fever or chills get worse. These symptoms may be an emergency. Do not wait to see if the symptoms will  go away. Get medical help right away. Call your local emergency services (911 in the [place] Do not drive yourself to the hospital. Summary  Acute bronchitis is when air tubes in the lungs (bronchi) suddenly get swollen. In adults, acute bronchitis usually goes away within 2 weeks.  Take over-the-counter and prescription medicines only as told by your doctor.  Drink enough fluid to keep your pee (urine) pale yellow.  Contact a doctor if your symptoms do not improve after 2 weeks of treatment.  Get help right away if you cough up blood, faint, or have chest pain or shortness of breath. This information is not intended to replace advice given to you by your health care provider. Make sure you discuss any questions you have with your health care provider. Document Revised: 07/31/2018 Document Reviewed: 07/31/2018 Elsevier Patient Education  [employer].

## 2019-06-17 NOTE — Progress Notes (Signed)
Patient: Sandra Freeman MRN: [id number] DOB: [birth year] PCP: Orma Flaming, MD     Subjective:  Chief Complaint  Patient presents with  . [phone]  . Cough    HPI: The patient is a 59 y.o. female who presents today for Covid f/u, and cough. She was diagnosed with covid on 05/13/19, but symptoms started a few days before this. She was given monoclonal infusion on 05/14/19. She continued to have cough and shortness of breath that she emailed me regularly about. I discussed she needed to make appointment. She states this past week she has improved greatly and nearly cancelled appointment. She has not had a cough since Sunday, but then had a little bit of a spell last night and this AM. She is not back to 100% and still has some fatigue. She feels like her cough now is more her "regular" cough then due to covid. She is back to work and working from home. She denies any fever/chills, she does have occasional wheezing that comes and goes. She hasn't really used her inhaler much at all and doesn't really feel like she needs it. She is overall doing much better.   Review of Systems  Constitutional: Negative for fever.  Respiratory: Positive for cough and shortness of breath. Negative for wheezing.   Cardiovascular: Negative for chest pain and palpitations.  Neurological: Negative for dizziness, light-headedness and headaches.    Allergies Patient is allergic to egg [eggs or egg-derived products]; doxycycline; hydroxyzine; and seroquel [quetiapine].  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  Surgical History Patient  has a past surgical history that includes Tubal ligation; Knee surgery; Cataract extraction; Posterior cervical laminectomy; Knee arthroscopy; and Anterior fusion cervical spine.  Family History Pateint's family history includes Alcohol abuse in her father, maternal grandfather, and maternal grandmother; COPD in her mother; Cancer in her father and mother; Cirrhosis in her maternal grandmother; Depression in her cousin; Diabetes in her father, half-brother, half-brother, and paternal grandmother; Heart disease in her father, half-brother, maternal grandfather, and paternal grandfather.  Social History Patient  reports that she has never smoked. She has never used smokeless tobacco. She reports current alcohol use of about 1.0 standard drinks of alcohol per week. She reports that she does not use drugs.    Objective: Vitals:   06/17/19 0839  BP: 130/82  Pulse: 94  Temp: 98.3 F (36.8 C)  TempSrc: Temporal  SpO2: 96%  Height: 5' (1.524 m)    Body mass index is 41.6 kg/m.  Physical Exam Vitals reviewed.  Constitutional:      Appearance: Normal appearance. She is obese.  HENT:     Head: Normocephalic.  Cardiovascular:     Rate and Rhythm: Normal rate and regular rhythm.     Heart sounds: Normal heart sounds.  Pulmonary:     Effort: Pulmonary effort is normal. No respiratory distress.     Breath sounds: Normal breath sounds. No wheezing or rales.  Abdominal:     General: Bowel sounds are normal.     Palpations: Abdomen is soft.  Musculoskeletal:     Cervical back: Normal range of motion and neck supple.  Skin:    General: Skin is warm.     Capillary Refill: Capillary refill takes less than 2 seconds.  Neurological:     General: No focal deficit present.     Mental Status: She is alert and  oriented to person, place, and time.  Psychiatric:        Mood and Affect: Mood normal.        Behavior: Behavior normal.         CXR: no consolidation. No acute findings, some bronchograms and appears more bronchitis.   Assessment/plan: 1. COVID-19 5 weeks out and doing much better. CXR with no acute findings. Continue conservative therapy.  - DG Chest 2 View; Future  2. Cough Much improved this week. CXR more typical of bronchitis and discussed she may have bronchitis or lingering covid effects. Regardless, continue conservative therapy. Did send in codeine cough syrup for her. Continue honey, cool mis humidifier and tessalon pearls. She is going to start exercising as well. She will let me know if symptoms change.  - DG Chest 2 View; Future   This visit occurred during the SARS-CoV-2 public health emergency.  Safety protocols were in place, including screening questions prior to the visit, additional usage of staff PPE, and extensive cleaning of exam room while observing appropriate contact time as indicated for disinfecting solutions.   This visit occurred during the SARS-CoV-2 public health emergency.  Safety protocols were in place, including screening questions prior to the visit, additional usage of staff PPE, and extensive cleaning of exam room while observing appropriate contact time as indicated for disinfecting solutions.     Return if symptoms worsen or fail to improve.     Orma Flaming, MD [place]  06/17/2019

## 2019-06-29 ENCOUNTER — Other Ambulatory Visit: Payer: Self-pay | Admitting: Family Medicine

## 2019-07-14 ENCOUNTER — Encounter: Payer: Self-pay | Admitting: Family Medicine

## 2019-07-16 ENCOUNTER — Ambulatory Visit (INDEPENDENT_AMBULATORY_CARE_PROVIDER_SITE_OTHER): Payer: BC Managed Care – PPO | Admitting: Physician Assistant

## 2019-07-16 ENCOUNTER — Other Ambulatory Visit: Payer: Self-pay

## 2019-07-16 ENCOUNTER — Encounter: Payer: Self-pay | Admitting: Physician Assistant

## 2019-07-16 DIAGNOSIS — F4310 Post-traumatic stress disorder, unspecified: Secondary | ICD-10-CM | POA: Diagnosis not present

## 2019-07-16 DIAGNOSIS — E6609 Other obesity due to excess calories: Secondary | ICD-10-CM | POA: Diagnosis not present

## 2019-07-16 DIAGNOSIS — F331 Major depressive disorder, recurrent, moderate: Secondary | ICD-10-CM

## 2019-07-16 DIAGNOSIS — F411 Generalized anxiety disorder: Secondary | ICD-10-CM

## 2019-07-16 MED ORDER — BUPROPION HCL ER (XL) 150 MG PO TB24: 150.0000 mg | ORAL_TABLET | Freq: Every morning | ORAL | 1 refills | Status: DC

## 2019-07-16 NOTE — Progress Notes (Signed)
[facility] Check  Patient ID: Sandra Freeman,  MRN: [id number]  PCP: Orma Flaming, MD  Date of Evaluation: 07/16/2019 Time spent:20 minutes  Chief Complaint:  Chief Complaint    Anxiety; Depression      HISTORY/CURRENT STATUS: HPI For routine med check.  Since LOV she had covid. Still has sx, esp fatigue.  She's working again, even though she's working from home, she gets so exhausted that all she wants to do is watch tv and play games on her phone. Has also gained a lot of weight and doesn't want to go out or do anything. "I've never been this heavy."  Feels depressed mostly about her weight.  She used Wellbutrin in the past for depression and she cannot remember why it was stopped.  She does not remember any bad side effects from it.  No SI/HI.  Does still have anxiety at times but is very careful about taking the Xanax.  It does work when she uses it.  Not really having panic attacks but more of a generalized sense of unease, like something bad is going to happen at times.  Patient denies increased energy with decreased need for sleep, no increased talkativeness, no racing thoughts, no impulsivity or risky behaviors, no increased spending, no increased libido, no grandiosity, no increased irritability or anger, and no hallucinations.  Denies dizziness, syncope, seizures, numbness, tingling, tremor, tics, unsteady gait, slurred speech, confusion. Denies muscle or joint pain, stiffness, or dystonia.  Individual Medical History/ Review of Systems: Changes? :Yes  Had covid about 2 months ago. Had infusion, and now long haulers.   Past medications for mental health diagnoses include: Wellbutrin, Effexor (for a few months) It's helpful, Xanax, Valium,hydroxyzine was ineffectiveand caused her to be sedated for several days, Seroquel caused severe dizziness.  Allergies: Egg [eggs or egg-derived products], Doxycycline, Hydroxyzine, and Seroquel [quetiapine]  Current  Medications:  Current Outpatient Medications:  .  albuterol (VENTOLIN HFA) 108 (90 Base) MCG/ACT inhaler, Inhale 2 puffs into the lungs every 4 (four) hours as needed for wheezing or shortness of breath., Disp: 6.7 g, Rfl: 1 .  ALPRAZolam (XANAX) 0.5 MG tablet, Take 0.5-1 tablets (0.25-0.5 mg total) by mouth 2 (two) times daily as needed for anxiety., Disp: 30 tablet, Rfl: 1 .  clotrimazole-betamethasone (LOTRISONE) cream, Apply 1 application topically 2 (two) times daily., Disp: 30 g, Rfl: 3 .  diclofenac sodium (VOLTAREN) 1 % GEL, Apply 3 gm to 3 large joints up to 3 times a day.Dispense 3 tubes with 3 refills., Disp: 3 Tube, Rfl: 1 .  pantoprazole (PROTONIX) 40 MG tablet, Take 1 tablet (40 mg total) by mouth daily., Disp: 90 tablet, Rfl: 1 .  venlafaxine XR (EFFEXOR XR) 150 MG 24 hr capsule, Take 2 capsules (300 mg total) by mouth daily with breakfast., Disp: 180 capsule, Rfl: 0 .  azithromycin (ZITHROMAX) 250 MG tablet, 2 pills today and then 1 pill days 2-5. Hold cholesterol drug while on this. (Patient not taking: Reported on 07/16/2019), Disp: 6 tablet, Rfl: 0 .  benzonatate (TESSALON) 200 MG capsule, Take 1 capsule by mouth three times daily as needed (Patient not taking: Reported on 07/16/2019), Disp: 20 capsule, Rfl: 0 .  buPROPion (WELLBUTRIN XL) 150 MG 24 hr tablet, Take 1 tablet (150 mg total) by mouth in the morning., Disp: 30 tablet, Rfl: 1 .  guaiFENesin-codeine 100-10 MG/5ML syrup, Take 10 mLs by mouth every 6 (six) hours as needed for cough. (Patient not taking: Reported on 07/16/2019), Disp: 120  mL, Rfl: 0 .  predniSONE (DELTASONE) 10 MG tablet, 28m (4 tabs) x1 days, 276m(2 tabs) x 2 days, then 1076m 2 days (Patient not taking: Reported on 06/17/2019), Disp: 10 tablet, Rfl: 0 Medication Side Effects: none  Family Medical/ Social History: Changes? No  MENTAL HEALTH EXAM:  Last menstrual period 01/04/2010.There is no height or weight on file to calculate BMI.  General Appearance:  Casual, Neat, Well Groomed and Obese  Eye Contact:  Good  Speech:  Clear and Coherent and Normal Rate  Volume:  Normal  Mood:  Euthymic  Affect:  Appropriate  Thought Process:  Goal Directed and Descriptions of Associations: Intact  Orientation:  Full (Time, Place, and Person)  Thought Content: Logical   Suicidal Thoughts:  No  Homicidal Thoughts:  No  Memory:  WNL  Judgement:  Good  Insight:  Good  Psychomotor Activity:  Normal  Concentration:  Concentration: Good  Recall:  Good  Fund of Knowledge: Good  Language: Good  Assets:  Desire for Improvement  ADL's:  Intact  Cognition: WNL  Prognosis:  Good    DIAGNOSES:    ICD-10-CM   1. Major depressive disorder, recurrent episode, moderate (HCC)  F33.1   2. Generalized anxiety disorder  F41.1   3. PTSD (post-traumatic stress disorder)  F43.10   4. Obesity due to excess calories without serious comorbidity, unspecified classification  E66.09     Receiving Psychotherapy: No    RECOMMENDATIONS:  PDMP reviewed I recommend we restart Wellbutrin.  We discussed the benefits, risks, side effects and she accepts.  If she has an increase in anxiety, let me know.  It should help increase her energy and can also curb her appetite and increase metabolism a little bit. Start Wellbutrin XL 150 mg, 1 p.o. every morning. Continue Xanax 0.5 mg, 1/2-1 p.o. twice daily as needed. Continue Effexor XR 150 mg, 2 p.o. every morning. Recommend therapy. Return in 4 to 6 weeks.   TerDonnal MoatA-C

## 2019-07-29 ENCOUNTER — Encounter: Payer: Self-pay | Admitting: Family Medicine

## 2019-07-29 ENCOUNTER — Ambulatory Visit (INDEPENDENT_AMBULATORY_CARE_PROVIDER_SITE_OTHER): Payer: BC Managed Care – PPO | Admitting: Family Medicine

## 2019-07-29 ENCOUNTER — Other Ambulatory Visit: Payer: Self-pay

## 2019-07-29 DIAGNOSIS — M79671 Pain in right foot: Secondary | ICD-10-CM

## 2019-07-29 NOTE — Patient Instructions (Addendum)
voltaren gel over the counter four times a day to heel.  Ice this  Use walking boot if it fits.  F/u with Dr. Sharol Given at ortho. Referral put in for you.   So good to see you!  Dr. Rogers Blocker   I have ordered xrays for you. At this time we do not have xrays in our clinic. You will have to go to our [place] clinic. The address is [address]  xray is located in the basement.  Hours of operation are M-F 8:30am to 5:00pm.  Closed for lunch between 12:30 and 1:00pm.

## 2019-07-29 NOTE — Progress Notes (Signed)
Patient: CECILIE Freeman MRN: [id number] DOB: 02-Jan-1961 PCP: Sandra Flaming, MD     Subjective:  Chief Complaint  Patient presents with  . Foot Pain    Right heel    HPI: The patient is a 59 y.o. female who presents today for pain in her right heel, starting 3 weeks ago. She has not injured it, but says that it is getting worse. She has taken an OTC ibuprofen, but still has not helped. Any kind of pressure on the heel hurts. Pain is very severe first thing in the AM. It still hurts throughout the day and get worse throughout the day. She doesn't really wear shoes because she works from home and can't tell me if wearing tennis shoes make it worse. Pain is rated as a 9/10 and described as pulling and tight. Sometimes has burning.  IF she flexes her foot she feels the pain go up back of heel.   Review of Systems  Cardiovascular: Negative for leg swelling.  Musculoskeletal: Positive for arthralgias. Negative for gait problem and joint swelling.    Allergies Patient is allergic to egg [eggs or egg-derived products], doxycycline, hydroxyzine, and seroquel [quetiapine].  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  Surgical History Patient  has a past surgical history that includes Tubal ligation; Knee surgery; Cataract extraction; Posterior cervical laminectomy; Knee arthroscopy; and Anterior fusion cervical spine.  Family  History Pateint's family history includes Alcohol abuse in her father, maternal grandfather, and maternal grandmother; COPD in her mother; Cancer in her father and mother; Cirrhosis in her maternal grandmother; Depression in her cousin; Diabetes in her father, half-brother, half-brother, and paternal grandmother; Heart disease in her father, half-brother, maternal grandfather, and paternal grandfather.  Social History Patient  reports that she has never smoked. She has never used smokeless tobacco. She reports current alcohol use of about 1.0 standard drink of alcohol per week. She reports that she does not use drugs.    Objective: Vitals:   07/29/19 0951  BP: 140/80  Pulse: 88  Temp: 98.5 F (36.9 C)  TempSrc: Temporal  SpO2: 98%  Weight: 212 lb 12.8 oz (96.5 kg)  Height: 5' (1.524 m)    Body mass index is 41.56 kg/m.  Physical Exam Vitals reviewed.  Constitutional:      Appearance: Normal appearance. She is obese.  HENT:     Head: Normocephalic and atraumatic.  Musculoskeletal:        General: Tenderness (TTP over right calceneus laterally and posteriorally at insertion of achilles tendon. no edema. "tightnesss" with dorsiflexion. has full ROM in ankle) present.  Neurological:     Mental Status: She is alert.        Assessment/plan: 1. Intractable right heel pain Concern for achilles vs. Stress fracture vs. Bursitis. Start voltaren gel QID and ice prn. Will put her in walking boot until she can see ortho. She has one at home and will see if this fits, if not she will let me know. Do not use if too big.  We do not have xray in clinic so she will just follow up at specialist for this. I did future  order the xray if she does decide to go to [facility] for this. Also could do short round of steroids.  - DG Foot Complete Right; Future   This visit occurred during the SARS-CoV-2 public health emergency.  Safety protocols were in place, including screening questions prior to the visit,  additional usage of staff PPE, and extensive cleaning of exam room while observing appropriate contact time as indicated for disinfecting solutions.      Return if symptoms worsen or fail to improve.   Sandra Flaming, MD [place]   07/29/2019

## 2019-08-09 ENCOUNTER — Ambulatory Visit: Payer: BC Managed Care – PPO | Admitting: Orthopedic Surgery

## 2019-08-09 ENCOUNTER — Encounter: Payer: Self-pay | Admitting: Orthopedic Surgery

## 2019-08-09 ENCOUNTER — Ambulatory Visit: Payer: Self-pay

## 2019-08-09 ENCOUNTER — Other Ambulatory Visit: Payer: Self-pay

## 2019-08-09 VITALS — Ht 60.0 in | Wt 212.0 lb

## 2019-08-09 DIAGNOSIS — M6701 Short Achilles tendon (acquired), right ankle: Secondary | ICD-10-CM | POA: Diagnosis not present

## 2019-08-09 DIAGNOSIS — M79671 Pain in right foot: Secondary | ICD-10-CM | POA: Diagnosis not present

## 2019-08-09 DIAGNOSIS — M722 Plantar fascial fibromatosis: Secondary | ICD-10-CM

## 2019-08-09 NOTE — Progress Notes (Signed)
Office Visit Note   Patient: Sandra Freeman           Date of Birth: [birth year]           MRN: [id number] Visit Date: 08/09/2019              Requested by: Orma Flaming, [address] [place],  [place] [address] PCP: Orma Flaming, MD  Chief Complaint  Patient presents with  . Right Foot - Pain    Right heel pain      HPI: Patient is a 59 year old woman who presents with right heel pain for about 4 weeks she denies any injury she states she has pain with ambulation pain with start up worse in the morning.  Patient states she had Covid in April and does have a history of asthma.  Assessment & Plan: Visit Diagnoses:  1. Pain of right heel   2. Achilles tendon contracture, right   3. Plantar fasciitis, right     Plan: Plan: Patient was given instructions and demonstrated Achilles stretching recommended a stiff soled walking sneaker with arch support.  Discussed the possibility of a gastrocnemius recession.  Follow-Up Instructions: Return in about 4 weeks (around 09/06/2019).   Ortho Exam  Patient is alert, oriented, no adenopathy, well-dressed, normal affect, normal respiratory effort. Examination patient has good pulses.  She has significant Achilles contracture with dorsiflexion 30 degrees short of neutral with her knee extended she has pain to palpation of the distal Achilles pain to palpation the origin of the plantar fascia.  The calcaneus is nontender to palpation.  There are no open ulcers.  Imaging: XR Os Calcis Right  Result Date: 08/09/2019 2 view radiographs of the right heel shows no calcaneal fracture no cystic or lytic changes.  No bone spurs.  There is some calcification in the distal Achilles.  No images are attached to the encounter.  Labs: Lab Results  Component Value Date   HGBA1C 5.2 03/31/2019   HGBA1C 4.9 09/19/2016   HGBA1C 5.2 02/25/2007   ESRSEDRATE 3 02/21/2017   CRP 0.4 (L) 02/21/2017   LABURIC 5.8 03/18/2017   REPTSTATUS  10/22/2008 FINAL 10/20/2008   CULT INSIGNIFICANT GROWTH 10/20/2008   LABORGA ESCHERICHIA COLI 08/08/2011     Lab Results  Component Value Date   ALBUMIN 3.9 03/31/2019   ALBUMIN 4.3 03/02/2018   ALBUMIN 4.0 09/19/2016   LABURIC 5.8 03/18/2017    No results found for: MG Lab Results  Component Value Date   VD25OH 42.12 03/31/2019   VD25OH 40.40 03/02/2018    No results found for: PREALBUMIN CBC EXTENDED Latest Ref Rng & Units 03/31/2019 03/02/2018 02/21/2017  WBC 4.0 - 10.5 K/uL 5.5 5.5 5.9  RBC 3.87 - 5.11 Mil/uL 4.55 4.79 4.71  HGB 12.0 - 15.0 g/dL 13.8 14.5 14.3  HCT 36 - 46 % 40.7 43.0 41.7  PLT 150 - 400 K/uL 157.0 174.0 208.0  NEUTROABS 1.4 - 7.7 K/uL 3.7 3.2 3.2  LYMPHSABS 0.7 - 4.0 K/uL 1.5 1.8 2.0     Body mass index is 41.4 kg/m.  Orders:  Orders Placed This Encounter  Procedures  . XR Os Calcis Right   No orders of the defined types were placed in this encounter.    Procedures: No procedures performed  Clinical Data: No additional findings.  ROS:  All other systems negative, except as noted in the HPI. Review of Systems  Objective: Vital Signs: Ht 5' (1.524 m)   Wt 212 lb (96.2  kg)   LMP 01/04/2010 (LMP Unknown)   BMI 41.40 kg/m   Specialty Comments:  No specialty comments available.  PMFS History: Patient Active Problem List   Diagnosis Date Noted  . NASH (nonalcoholic steatohepatitis) 04/09/2019  . PTSD (post-traumatic stress disorder) 01/29/2018  . DDD (degenerative disc disease), cervical 03/28/2017  . DDD (degenerative disc disease), lumbar 03/28/2017  . FATTY LIVER DISEASE 06/17/2007  . Anxiety state 06/02/2007  . ASCUS PAP 04/23/2007  . Depression, major, single episode, mild ([place]) 02/06/2007  . Allergic rhinitis 02/06/2007  . Asthma 02/06/2007  . GERD 02/06/2007   Past Medical History:  Diagnosis Date  . ALLERGIC RHINITIS 02/06/2007  . ANEMIA, IRON DEFICIENCY 06/17/2007  . ANXIETY DISORDER 06/02/2007  . ASCUS PAP 04/23/2007   . ASTHMA 02/06/2007  . DEPRESSION 02/06/2007  . DIZZINESS 02/13/2007  . Essential hypertension, benign 04/16/2007  . FATTY LIVER DISEASE 06/17/2007  . Gastroparesis 10/21/2008  . GERD 02/06/2007  . HYPERTENSION 03/18/2008  . HYPOTHYROIDISM 02/06/2007  . LIVER FUNCTION TESTS, ABNORMAL 02/10/2007  . NONSPEC ELEVATION OF LEVELS OF TRANSAMINASE/LDH 06/02/2007  . OBESITY 02/06/2007  . 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 WRIST SPRAIN, RIGHT 05/23/2008    Family History  Problem Relation Age of Onset  . Cancer Mother        breast  . COPD Mother   . Diabetes Father   . Cancer Father        lung  . Heart disease Father   . Alcohol abuse Father   . Diabetes Half-Brother   . Heart disease Half-Brother   . Diabetes Half-Brother   . Heart disease Maternal Grandfather   . Alcohol abuse Maternal Grandfather   . Alcohol abuse Maternal Grandmother   . Cirrhosis Maternal Grandmother   . Heart disease Paternal Grandfather   . Diabetes Paternal Grandmother   . Depression Cousin     Past Surgical History:  Procedure Laterality Date  . ANTERIOR FUSION CERVICAL SPINE    . CATARACT EXTRACTION    . KNEE ARTHROSCOPY     left  . KNEE SURGERY    . POSTERIOR CERVICAL LAMINECTOMY    . TUBAL LIGATION     Social History   Occupational History  . Occupation: Occupational hygienist  Tobacco Use  . Smoking status: Never Smoker  . Smokeless tobacco: Never Used  Vaping Use  . Vaping Use: Never used  Substance and Sexual Activity  . Alcohol use: Yes    Alcohol/week: 1.0 standard drink    Types: 1 Cans of beer per week    Comment: rare  . Drug use: Never  . Sexual activity: Not on file

## 2019-08-11 ENCOUNTER — Other Ambulatory Visit: Payer: Self-pay | Admitting: Family Medicine

## 2019-08-11 NOTE — Telephone Encounter (Signed)
Called medication into pharmacy.

## 2019-08-13 ENCOUNTER — Ambulatory Visit: Payer: BC Managed Care – PPO | Admitting: Physician Assistant

## 2019-08-29 ENCOUNTER — Emergency Department (HOSPITAL_COMMUNITY)
Admission: EM | Admit: 2019-08-29 | Discharge: 2019-08-30 | Disposition: A | Discharge status: Home / Self Care | Payer: BC Managed Care – PPO | Attending: Emergency Medicine | Admitting: Emergency Medicine

## 2019-08-29 ENCOUNTER — Other Ambulatory Visit: Payer: Self-pay

## 2019-08-29 ENCOUNTER — Encounter (HOSPITAL_COMMUNITY): Payer: Self-pay | Admitting: Emergency Medicine

## 2019-08-29 ENCOUNTER — Emergency Department (HOSPITAL_COMMUNITY): Payer: BC Managed Care – PPO

## 2019-08-29 DIAGNOSIS — R05 Cough: Secondary | ICD-10-CM | POA: Insufficient documentation

## 2019-08-29 DIAGNOSIS — J45909 Unspecified asthma, uncomplicated: Secondary | ICD-10-CM | POA: Insufficient documentation

## 2019-08-29 DIAGNOSIS — Z5321 Procedure and treatment not carried out due to patient leaving prior to being seen by health care provider: Secondary | ICD-10-CM | POA: Insufficient documentation

## 2019-08-29 DIAGNOSIS — R0602 Shortness of breath: Secondary | ICD-10-CM | POA: Diagnosis not present

## 2019-08-29 DIAGNOSIS — R062 Wheezing: Secondary | ICD-10-CM | POA: Diagnosis not present

## 2019-08-29 HISTORY — DX: Unspecified asthma, uncomplicated: J45.909

## 2019-08-29 MED ORDER — ALBUTEROL SULFATE (2.5 MG/3ML) 0.083% IN NEBU
5.0000 mg | INHALATION_SOLUTION | Freq: Once | RESPIRATORY_TRACT | Status: AC
  Administered 2019-08-29: 5 mg via RESPIRATORY_TRACT
  Filled 2019-08-29: qty 6

## 2019-08-29 NOTE — ED Triage Notes (Signed)
Patient reports asthma attack this evening with wheezing and dry cough unrelieved by rescue inhaler at home , no fever or chills .

## 2019-08-30 ENCOUNTER — Encounter: Payer: Self-pay | Admitting: Family Medicine

## 2019-09-01 ENCOUNTER — Other Ambulatory Visit: Payer: Self-pay

## 2019-09-01 ENCOUNTER — Ambulatory Visit: Payer: BC Managed Care – PPO | Admitting: Family Medicine

## 2019-09-01 ENCOUNTER — Encounter: Payer: Self-pay | Admitting: Family Medicine

## 2019-09-01 VITALS — BP 130/80 | HR 88 | Temp 97.8°F | Ht 60.0 in | Wt 211.8 lb

## 2019-09-01 DIAGNOSIS — R05 Cough: Secondary | ICD-10-CM

## 2019-09-01 DIAGNOSIS — R059 Cough, unspecified: Secondary | ICD-10-CM

## 2019-09-01 MED ORDER — GUAIFENESIN-CODEINE 100-10 MG/5ML PO SOLN: 10.0000 mL | Freq: Four times a day (QID) | ORAL | 1 refills | Status: DC | PRN

## 2019-09-01 NOTE — Patient Instructions (Signed)
-  referral to pulmonology for lung function studies -lungs and cxr ;look and sound great so I don't want to add anything right now -refill codeine cough syrup. Keep me posted on symptoms.    If you get covid shot, only need ONE since you had infection and monoclonal Ab.    Love seeing you!  Dr. Rogers Blocker

## 2019-09-01 NOTE — Progress Notes (Signed)
Patient: Sandra Freeman MRN: [id number] DOB: 1960/05/23 PCP: Orma Flaming, MD     Subjective:  Chief Complaint  Patient presents with  . Follow-up  . Wheezing  . Shortness of Breath  . Cough    HPI: The patient is a 59 y.o. female who presents today for ED follow up for wheezing, coughing, and shortness of breath. She was seen on 08/29/2019.  Pt says that she is doing a lot better. She says she doesn't have much coughing anymore. She says this one of her better days. She has a history of asthma and feels like she had an asthma attack. She tried her rescue inhaler at home and this didn't work. When she got to ER she was given a breathing treatment and xray. She was put back in waiting room and waited for 2 + hours. Was told it would be 2-5 hours and she was tired of waiting and left. CXR was normal. She had no fever/chills or other signs of infection. She felt better after the breathing treatment.   She states she is feeling better, but still a little tight in her chest. Her ribs hurt from coughing so much. Cough has improved. She is using her albuterol inhaler scheduled, but typically just uses very prn.   Review of Systems  Constitutional: Negative for chills, diaphoresis, fatigue and fever.  Respiratory: Positive for cough. Negative for shortness of breath and wheezing.   Cardiovascular: Negative for chest pain and palpitations.  Gastrointestinal: Negative for abdominal pain, diarrhea, nausea and vomiting.  Neurological: Negative for dizziness and headaches.    Allergies Patient is allergic to egg [eggs or egg-derived products], doxycycline, hydroxyzine, and seroquel [quetiapine].  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  Surgical History Patient  has a past surgical history that includes Tubal ligation; Knee surgery; Cataract extraction; Posterior cervical laminectomy; Knee arthroscopy; and Anterior fusion cervical spine.  Family History Pateint's family history includes Alcohol abuse in her father, maternal grandfather, and maternal grandmother; COPD in her mother; Cancer in her father and mother; Cirrhosis in her maternal grandmother; Depression in her cousin; Diabetes in her father, half-brother, half-brother, and paternal grandmother; Heart disease in her father, half-brother, maternal grandfather, and paternal grandfather.  Social History Patient  reports that she has never smoked. She has never used smokeless tobacco. She reports current alcohol use of about 1.0 standard drink of alcohol per week. She reports that she does not use drugs.    Objective: Vitals:   09/01/19 0830  BP: 130/80  Pulse: 88  Temp: 97.8 F (36.6 C)  TempSrc: Temporal  SpO2: 98%  Weight: 211 lb 12.8 oz (96.1 kg)  Height: 5' (1.524 m)    Body mass index is 41.36 kg/m.  Physical Exam Vitals reviewed.  Constitutional:      Appearance: She is well-developed. She is obese.  HENT:     Head: Normocephalic and atraumatic.  Pulmonary:     Effort: Pulmonary effort is normal. No tachypnea or respiratory distress.     Breath sounds: Normal breath sounds. No stridor. No decreased breath sounds or wheezing.     Comments: Good air  movement throughout  Skin:    General: Skin is warm.     Capillary Refill: Capillary refill takes less than 2 seconds.  Neurological:     General: No focal deficit present.     Mental  Status: She is alert.  Psychiatric:        Mood and Affect: Mood normal.        Behavior: Behavior normal.        Assessment/plan: 1. Cough -CXR clear and lung exam completely normal with good air flow.  -symptoms improved. I do wonder if she has some post covid changes vs. Bronchospasm vs. Asthma attack. Discussed possible future course of treatment vs. pulm referral for PFTs.  -going to send for PFTS, continue albuterol inhaler PRN.  -refill codeine cough syrup.      This visit occurred during the SARS-CoV-2 public health emergency.  Safety protocols were in place, including screening questions prior to the visit, additional usage of staff PPE, and extensive cleaning of exam room while observing appropriate contact time as indicated for disinfecting solutions.     Return if symptoms worsen or fail to improve.   Orma Flaming, MD [place]   09/01/2019

## 2019-09-09 ENCOUNTER — Encounter: Payer: Self-pay | Admitting: Orthopedic Surgery

## 2019-09-09 ENCOUNTER — Ambulatory Visit (INDEPENDENT_AMBULATORY_CARE_PROVIDER_SITE_OTHER): Payer: BC Managed Care – PPO | Admitting: Physician Assistant

## 2019-09-09 VITALS — Ht 60.0 in | Wt 211.0 lb

## 2019-09-09 DIAGNOSIS — M79671 Pain in right foot: Secondary | ICD-10-CM

## 2019-09-09 NOTE — Progress Notes (Signed)
Office Visit Note   Patient: Sandra Freeman           Date of Birth: Dec 11, 1960           MRN: [id number] Visit Date: 09/09/2019              Requested by: Sandra Freeman, Sandra Freeman,  Sandra Freeman [id number] PCP: Sandra Flaming, MD  Chief Complaint  Patient presents with  . 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  She has been doing stretching but still continues to have burning in the posterior aspect of her ankle.  She does not feel like she has made a lot of progress.  She is currently working with a pulmonologist to stabilize her asthma  Assessment & Plan: Visit Diagnoses: No diagnosis found.  Plan: She will continue to do heel cord stretching.  Discussed with the patient that the next step would be a gastrocnemius recession as discussed with Dr. Sharol Given.  If she stabilizes her asthma and wishes to go through with this she should contact her office this can be scheduled.  I reviewed the risks and outcomes and recovery of the surgery  Follow-Up Instructions: No follow-ups on file.   Ortho Exam  Patient is alert, oriented, no adenopathy, well-dressed, normal affect, normal respiratory effort. Focused examination of her right foot demonstrates no swelling no cellulitis no metatarsalgia.  She does have some burning pain in the posterior heel.  She flexes only to a neutral position.  Compartments are soft and compressible Achilles is intact  Imaging: No results found. No images are attached to the encounter.  Labs: Lab Results  Component Value Date   HGBA1C 5.2 03/31/2019   HGBA1C 4.9 09/19/2016   HGBA1C 5.2 02/25/2007   ESRSEDRATE 3 02/21/2017   CRP 0.4 (L) 02/21/2017   LABURIC 5.8 03/18/2017   REPTSTATUS 10/22/2008 FINAL 10/20/2008   CULT INSIGNIFICANT GROWTH 10/20/2008   LABORGA ESCHERICHIA COLI 08/08/2011     Lab Results  Component Value Date    ALBUMIN 3.9 03/31/2019   ALBUMIN 4.3 03/02/2018   ALBUMIN 4.0 09/19/2016   LABURIC 5.8 03/18/2017    No results found for: MG Lab Results  Component Value Date   VD25OH 42.12 03/31/2019   VD25OH 40.40 03/02/2018    No results found for: PREALBUMIN CBC EXTENDED Latest Ref Rng & Units 03/31/2019 03/02/2018 02/21/2017  WBC 4.0 - 10.5 K/uL 5.5 5.5 5.9  RBC 3.87 - 5.11 Mil/uL 4.55 4.79 4.71  HGB 12.0 - 15.0 g/dL 13.8 14.5 14.3  HCT 36 - 46 % 40.7 43.0 41.7  PLT 150 - 400 K/uL 157.0 174.0 208.0  NEUTROABS 1.4 - 7.7 K/uL 3.7 3.2 3.2  LYMPHSABS 0.7 - 4.0 K/uL 1.5 1.8 2.0     Body mass index is 41.21 kg/m.  Orders:  No orders of the defined types were placed in this encounter.  No orders of the defined types were placed in this encounter.    Procedures: No procedures performed  Clinical Data: No additional findings.  ROS:  All other systems negative, except as noted in the HPI. Review of Systems  Objective: Vital Signs: Ht 5' (1.524 m)   Wt 211 lb (95.7 kg)   LMP 01/04/2010 (LMP Unknown)   BMI 41.21 kg/m   Specialty Comments:  No specialty comments available.  PMFS History: Patient Active  Problem List   Diagnosis Date Noted  . NASH (nonalcoholic steatohepatitis) 04/09/2019  . PTSD (post-traumatic stress disorder) 01/29/2018  . DDD (degenerative disc disease), cervical 03/28/2017  . DDD (degenerative disc disease), lumbar 03/28/2017  . FATTY LIVER DISEASE 06/17/2007  . Anxiety state 06/02/2007  . ASCUS PAP 04/23/2007  . 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 Allergic rhinitis 02/06/2007  . Asthma 02/06/2007  . GERD 02/06/2007   Past Medical History:  Diagnosis Date  . ALLERGIC RHINITIS 02/06/2007  . ANEMIA, IRON DEFICIENCY 06/17/2007  . ANXIETY DISORDER 06/02/2007  . ASCUS PAP 04/23/2007  . ASTHMA 02/06/2007  . Asthma   . DEPRESSION 02/06/2007  . DIZZINESS 02/13/2007  . Essential hypertension, benign 04/16/2007  . FATTY LIVER DISEASE  06/17/2007  . Gastroparesis 10/21/2008  . GERD 02/06/2007  . HYPERTENSION 03/18/2008  . HYPOTHYROIDISM 02/06/2007  . LIVER FUNCTION TESTS, ABNORMAL 02/10/2007  . NONSPEC ELEVATION OF LEVELS OF TRANSAMINASE/LDH 06/02/2007  . OBESITY 02/06/2007  . 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 WRIST SPRAIN, RIGHT 05/23/2008    Family History  Problem Relation Age of Onset  . Cancer Mother        breast  . COPD Mother   . Diabetes Father   . Cancer Father        lung  . Heart disease Father   . Alcohol abuse Father   . Diabetes Half-Brother   . Heart disease Half-Brother   . Diabetes Half-Brother   . Heart disease Maternal Grandfather   . Alcohol abuse Maternal Grandfather   . Alcohol abuse Maternal Grandmother   . Cirrhosis Maternal Grandmother   . Heart disease Paternal Grandfather   . Diabetes Paternal Grandmother   . Depression Cousin     Past Surgical History:  Procedure Laterality Date  . ANTERIOR FUSION CERVICAL SPINE    . CATARACT EXTRACTION    . KNEE ARTHROSCOPY     left  . KNEE SURGERY    . POSTERIOR CERVICAL LAMINECTOMY    . TUBAL LIGATION     Social History   Occupational History  . Occupation: Occupational hygienist  Tobacco Use  . Smoking status: Never Smoker  . Smokeless tobacco: Never Used  Vaping Use  . Vaping Use: Never used  Substance and Sexual Activity  . Alcohol use: Yes    Alcohol/week: 1.0 standard drink    Types: 1 Cans of beer per week    Comment: rare  . Drug use: Never  . Sexual activity: Not on file

## 2019-09-21 ENCOUNTER — Encounter: Payer: Self-pay | Admitting: Family Medicine

## 2019-09-28 ENCOUNTER — Other Ambulatory Visit: Payer: Self-pay | Admitting: Physician Assistant

## 2019-09-29 ENCOUNTER — Other Ambulatory Visit: Payer: Self-pay | Admitting: Physician Assistant

## 2019-09-29 NOTE — Telephone Encounter (Signed)
Review.

## 2019-09-29 NOTE — Telephone Encounter (Signed)
Last apt end of June was due back 4-6 weeks, nothing scheduled

## 2019-10-11 ENCOUNTER — Encounter: Payer: Self-pay | Admitting: Pulmonary Disease

## 2019-10-11 ENCOUNTER — Other Ambulatory Visit: Payer: Self-pay

## 2019-10-11 ENCOUNTER — Ambulatory Visit: Payer: BC Managed Care – PPO | Admitting: Pulmonary Disease

## 2019-10-11 VITALS — BP 116/72 | HR 83 | Temp 98.4°F | Ht 60.0 in | Wt 206.4 lb

## 2019-10-11 DIAGNOSIS — R0982 Postnasal drip: Secondary | ICD-10-CM | POA: Diagnosis not present

## 2019-10-11 DIAGNOSIS — R05 Cough: Secondary | ICD-10-CM | POA: Diagnosis not present

## 2019-10-11 DIAGNOSIS — R0602 Shortness of breath: Secondary | ICD-10-CM | POA: Diagnosis not present

## 2019-10-11 DIAGNOSIS — R059 Cough, unspecified: Secondary | ICD-10-CM

## 2019-10-11 MED ORDER — FLUTICASONE PROPIONATE 50 MCG/ACT NA SUSP: 1.0000 | Freq: Every day | NASAL | 2 refills | Status: DC
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
## 2019-10-11 NOTE — Patient Instructions (Signed)
-   Start flonase nasal spray, 1 spray each nostril daily - We will check pulmonary function tests to assess your cough and shortness of breath - Continue albuterol inhaler as needed - We will call you with the pulmonary function test results and possible additional inhaler therapy.

## 2019-10-14 ENCOUNTER — Ambulatory Visit (INDEPENDENT_AMBULATORY_CARE_PROVIDER_SITE_OTHER): Payer: BC Managed Care – PPO | Admitting: Pulmonary Disease

## 2019-10-14 ENCOUNTER — Other Ambulatory Visit: Payer: Self-pay

## 2019-10-14 DIAGNOSIS — R0602 Shortness of breath: Secondary | ICD-10-CM | POA: Diagnosis not present

## 2019-10-14 LAB — PULMONARY FUNCTION TEST
DL/VA % pred: 120 %
DL/VA: 5.19 ml/min/mmHg/L
DLCO cor % pred: 104 %
DLCO cor: 18.98 ml/min/mmHg
DLCO unc % pred: 104 %
DLCO unc: 18.98 ml/min/mmHg
FEF 25-75 Post: 1.81 L/sec
FEF 25-75 Pre: 1.93 L/sec
FEF2575-%Change-Post: -6 %
FEF2575-%Pred-Post: 84 %
FEF2575-%Pred-Pre: 90 %
FEV1-%Change-Post: 0 %
FEV1-%Pred-Post: 83 %
FEV1-%Pred-Pre: 83 %
FEV1-Post: 1.88 L
FEV1-Pre: 1.88 L
FEV1FVC-%Change-Post: -1 %
FEV1FVC-%Pred-Pre: 103 %
FEV6-%Change-Post: 2 %
FEV6-%Pred-Post: 83 %
FEV6-%Pred-Pre: 81 %
FEV6-Post: 2.35 L
FEV6-Pre: 2.3 L
FEV6FVC-%Change-Post: 0 %
FEV6FVC-%Pred-Post: 103 %
FEV6FVC-%Pred-Pre: 103 %
FVC-%Change-Post: 0 %
FVC-%Pred-Post: 80 %
FVC-%Pred-Pre: 79 %
FVC-Post: 2.35 L
FVC-Pre: 2.33 L
Post FEV1/FVC ratio: 80 %
Post FEV6/FVC ratio: 100 %
Pre FEV1/FVC ratio: 81 %
Pre FEV6/FVC Ratio: 99 %
RV % pred: 101 %
RV: 1.88 L
TLC % pred: 90 %
TLC: 4.18 L

## 2019-10-14 NOTE — Progress Notes (Signed)
PFT done today. 

## 2019-10-15 ENCOUNTER — Telehealth: Payer: Self-pay | Admitting: Pulmonary Disease

## 2019-10-15 NOTE — Telephone Encounter (Signed)
Spoke with patient regarding prior message Advised patient pulmonary function tests are normal.   She is to continue using the flonase nasal spray daily. She is to also start loratadine 44m daily for allergies. It appears her nasal/sinus drainage is what is affecting her cough and shortness of breath. If she is continuing to experience symptoms after 2-3 weeks after these treatments, she can call [facility] for the next steps.   Patient's voice was understanding. Nothing else further needed.

## 2019-10-15 NOTE — Telephone Encounter (Signed)
Please let the patient know that her pulmonary function tests are normal.   She is to continue using the flonase nasal spray daily. She is to also start loratadine 35m daily for allergies. It appears her nasal/sinus drainage is what is affecting her cough and shortness of breath. If she is continuing to experience symptoms after 2-3 weeks after these treatments, she can call [facility] for the next steps.  Thanks, JWille Glaser

## 2019-11-12 ENCOUNTER — Other Ambulatory Visit: Payer: Self-pay | Admitting: Family Medicine

## 2019-11-12 NOTE — Telephone Encounter (Signed)
Last OV--09/01/19 Last refill--08/11/19.[name] KitchenMarland Kitchenplease advise

## 2019-12-15 ENCOUNTER — Other Ambulatory Visit: Payer: Self-pay | Admitting: Physician Assistant

## 2019-12-23 ENCOUNTER — Other Ambulatory Visit: Payer: Self-pay | Admitting: Physician Assistant

## 2019-12-31 ENCOUNTER — Encounter: Payer: Self-pay | Admitting: Family Medicine

## 2020-01-03 ENCOUNTER — Ambulatory Visit (INDEPENDENT_AMBULATORY_CARE_PROVIDER_SITE_OTHER): Payer: BC Managed Care – PPO | Admitting: Family Medicine

## 2020-01-03 ENCOUNTER — Ambulatory Visit (INDEPENDENT_AMBULATORY_CARE_PROVIDER_SITE_OTHER)
Admission: RE | Admit: 2020-01-03 | Discharge: 2020-01-03 | Disposition: A | Discharge status: Home / Self Care | Payer: BC Managed Care – PPO | Source: Ambulatory Visit | Attending: Family Medicine | Admitting: Family Medicine

## 2020-01-03 ENCOUNTER — Encounter: Payer: Self-pay | Admitting: Family Medicine

## 2020-01-03 ENCOUNTER — Telehealth: Payer: Self-pay

## 2020-01-03 ENCOUNTER — Other Ambulatory Visit: Payer: Self-pay

## 2020-01-03 VITALS — BP 160/97 | HR 102 | Temp 99.6°F | Ht 60.0 in | Wt 206.0 lb

## 2020-01-03 DIAGNOSIS — J069 Acute upper respiratory infection, unspecified: Secondary | ICD-10-CM

## 2020-01-03 DIAGNOSIS — R062 Wheezing: Secondary | ICD-10-CM

## 2020-01-03 DIAGNOSIS — J4520 Mild intermittent asthma, uncomplicated: Secondary | ICD-10-CM

## 2020-01-03 DIAGNOSIS — J9809 Other diseases of bronchus, not elsewhere classified: Secondary | ICD-10-CM | POA: Diagnosis not present

## 2020-01-03 MED ORDER — AZITHROMYCIN 250 MG PO TABS: ORAL_TABLET | ORAL | 0 refills | Status: DC

## 2020-01-03 MED ORDER — AMOXICILLIN 500 MG PO CAPS: 1000.0000 mg | ORAL_CAPSULE | Freq: Three times a day (TID) | ORAL | 0 refills | Status: DC

## 2020-01-03 MED ORDER — BENZONATATE 200 MG PO CAPS: ORAL_CAPSULE | ORAL | 0 refills | Status: DC

## 2020-01-03 MED ORDER — GUAIFENESIN-CODEINE 100-10 MG/5ML PO SOLN
10.0000 mL | Freq: Four times a day (QID) | ORAL | 1 refills | Status: DC | PRN
Start: 2020-01-03 — End: 2020-04-14

## 2020-01-03 MED ORDER — ALBUTEROL SULFATE (2.5 MG/3ML) 0.083% IN NEBU: 2.5000 mg | INHALATION_SOLUTION | Freq: Once | RESPIRATORY_TRACT | Status: DC

## 2020-01-03 MED ORDER — PREDNISONE 20 MG PO TABS: 40.0000 mg | ORAL_TABLET | Freq: Every day | ORAL | 0 refills | Status: DC

## 2020-01-03 NOTE — Telephone Encounter (Signed)
Error

## 2020-01-03 NOTE — Progress Notes (Signed)
Patient: Sandra Freeman MRN: [id number] DOB: [birth year] PCP: Orma Flaming, MD     Subjective:  Chief Complaint  Patient presents with   Cough   Nasal Congestion   Shortness of Breath    HPI: The patient is a 59 y.o. female who presents today for cough,  nasal congestion. Symptoms started Thursday morning with a dry cough, hoarseness. Saturday morning she got some of her voice back and then Saturday night it hit her with coughing spells with a dry cough, low grade fever to 100.0, wheezing during the night. She doesn't really feel tight in her chest until she has a coughing spell. She has had covid back in April and has had vaccine. She states she feels like this is her typical bronchitis. I have referred her to pulm whom she sees this month. She is short of breath and she is congested. She does have pulse ox at home and oxygen has been > 93%. She denies any sinus pain or pressure, headache, loss of taste or smell. Has been using her nasal spray as instructed by her pulmonologist. PFTs normal in 09/2019.   Review of Systems  Constitutional: Positive for chills. Negative for fatigue and fever.  HENT: Positive for congestion. Negative for sinus pressure, sinus pain and sore throat.   Eyes: Negative for visual disturbance.  Respiratory: Positive for cough, shortness of breath and wheezing. Negative for chest tightness.   Cardiovascular: Negative for chest pain, palpitations and leg swelling.  Gastrointestinal: Negative for abdominal pain, nausea and vomiting.  Neurological: Negative for dizziness and headaches.    Allergies Patient is allergic to egg [eggs or egg-derived products], doxycycline, hydroxyzine, and seroquel [quetiapine].  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  Surgical History Patient  has a past surgical history that includes Tubal ligation; Knee surgery; Cataract extraction; Posterior cervical laminectomy; Knee arthroscopy; and Anterior fusion cervical spine.  Family History Pateint's family history includes Alcohol abuse in her father, maternal grandfather, and maternal grandmother; COPD in her mother; Cancer in her father and mother; Cirrhosis in her maternal grandmother; Depression in her cousin; Diabetes in her father, half-brother, half-brother, and paternal grandmother; Heart disease in her father, half-brother, maternal grandfather, and paternal grandfather.  Social History Patient  reports that she has never smoked. She has never used smokeless tobacco. She reports current alcohol use of about 1.0 standard drink of alcohol per week. She reports that she does not use drugs.    Objective: Vitals:   01/03/20 1306 01/03/20 1315  BP: (!) 160/97   Pulse: (!) 102   Temp: (!) 101 F (38.3 C) 99.6 F (37.6 C)  TempSrc: Temporal Temporal  SpO2: 98%   Weight: 206 lb (93.4 kg)   Height: 5' (1.524 m)     Body mass index is 40.23 kg/m.  Physical Exam Vitals reviewed.  Constitutional:      General: She is not in acute distress.    Appearance: She is well-developed. She is obese. She is not ill-appearing.  HENT:     Head: Normocephalic and atraumatic.  Cardiovascular:     Rate and Rhythm: Regular rhythm. Tachycardia present.  Heart sounds: Normal heart sounds.  Pulmonary:     Effort: Pulmonary effort is normal.     Breath sounds: Examination of the left-upper field reveals  wheezing. Wheezing present. No decreased breath sounds, rhonchi or rales.     Comments: Rare inspiratory wheeze in LUL, otherwise clear with good air movement.  Musculoskeletal:     Cervical back: Normal range of motion and neck supple.  Lymphadenopathy:     Cervical: No cervical adenopathy.  Skin:    General: Skin is warm.     Capillary Refill: Capillary refill takes less than 2 seconds.  Neurological:     General: No focal deficit present.     Mental Status: She is alert and oriented to person, place, and time.  Psychiatric:        Mood and Affect: Mood normal.        Behavior: Behavior normal.    CXR: no acute process. No consolidation. Bronchograms/airway disease. Official read pending.      Assessment/plan: 1. URI, acute I think her original temperature was an error. No true fevers >100.4. appears more bronchitis. Start her on a steroid burst, zpack, CXR (appears clear with no consolidation), honey, cool mist humidifier and mucinex. Also sending in a codeine cough syrup that really has helped her. Discussed I want her oxygen >93%. Very strict precautions given. Has f/u with pulm soon as well.  - DG Chest 2 View; Future  2. Wheezing Very very rare, overall good air flow. Steroid burst and her rescue inhaler prn. precautions given.     This visit occurred during the SARS-CoV-2 public health emergency.  Safety protocols were in place, including screening questions prior to the visit, additional usage of staff PPE, and extensive cleaning of exam room while observing appropriate contact time as indicated for disinfecting solutions.     Return if symptoms worsen or fail to improve.   Orma Flaming, MD [place]   01/03/2020

## 2020-01-03 NOTE — Telephone Encounter (Signed)
Please have her come into office at 1:00. Have her call and then we can go get her through side door. She has recently had covid so am not concerned for covid.  Orma Flaming, MD [place]

## 2020-01-03 NOTE — Telephone Encounter (Signed)
Patient is requesting to be worked in today patient states it has to be today

## 2020-01-03 NOTE — Patient Instructions (Addendum)
I have ordered xrays for you. At this time we do not have xrays in our clinic. You will have to go to our [place] clinic. The address is [address]  xray is located in the basement.  Hours of operation are M-F 8:30am to 5:00pm.  Closed for lunch between 12:30 and 1:00pm.    -lungs actually sound good. Will do breathing treatment, steroids, antibiotic (fever) and get CXR to rule out pneumonia. Also sent in cough medication.   If oxygen less than 92%, shortness of breath, worsening dypsnea need to go to ER.   -cool mist humidifier, honey is also great for cough as well as mucinex.

## 2020-01-04 ENCOUNTER — Other Ambulatory Visit: Payer: Self-pay | Admitting: Family Medicine

## 2020-01-04 ENCOUNTER — Ambulatory Visit: Payer: BC Managed Care – PPO | Admitting: Pulmonary Disease

## 2020-01-04 DIAGNOSIS — J4520 Mild intermittent asthma, uncomplicated: Secondary | ICD-10-CM

## 2020-01-04 DIAGNOSIS — R062 Wheezing: Secondary | ICD-10-CM

## 2020-01-04 MED ORDER — ALBUTEROL SULFATE (2.5 MG/3ML) 0.083% IN NEBU: 2.5000 mg | INHALATION_SOLUTION | Freq: Four times a day (QID) | RESPIRATORY_TRACT | 1 refills | Status: DC | PRN

## 2020-01-19 ENCOUNTER — Other Ambulatory Visit: Payer: Self-pay | Admitting: Family Medicine

## 2020-01-25 ENCOUNTER — Ambulatory Visit: Payer: BC Managed Care – PPO | Admitting: Pulmonary Disease

## 2020-01-25 ENCOUNTER — Encounter: Payer: Self-pay | Admitting: Pulmonary Disease

## 2020-01-25 ENCOUNTER — Other Ambulatory Visit: Payer: Self-pay

## 2020-01-25 VITALS — BP 128/82 | HR 82 | Temp 97.1°F | Ht 60.0 in | Wt 210.8 lb

## 2020-01-25 DIAGNOSIS — J40 Bronchitis, not specified as acute or chronic: Secondary | ICD-10-CM | POA: Diagnosis not present

## 2020-01-25 MED ORDER — PREDNISONE 10 MG PO TABS: 10.0000 mg | ORAL_TABLET | Freq: Every day | ORAL | 0 refills | Status: DC

## 2020-01-25 NOTE — Patient Instructions (Addendum)
Prednisone Taper: 64m daily x 3 days 319mdaily x 3 days 2050maily x 3 days 10m25mily x 3 days  Use albuterol 1-2 puffs every 4-6 hours as needed for cough  Please call if symptoms not improved
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
## 2020-02-03 ENCOUNTER — Telehealth: Payer: Self-pay | Admitting: Pulmonary Disease

## 2020-02-03 DIAGNOSIS — R490 Dysphonia: Secondary | ICD-10-CM

## 2020-02-03 NOTE — Telephone Encounter (Signed)
Office notes indicate that this is her second course of prednisone. Would have her finish prednisone and not restart any additional prednisone.  Do warm salt water gargles. Needs to be COVID tested.  Please call if test results are positive.  Quarantine until results are available.  Would do voice rest. Begin Delsym 2 teaspoons twice daily for cough as needed Use Tessalon 200 mg 3 times daily for cough as needed Use Zyrtec 10 mg daily for 2 weeks and then as needed Sips of water to soothe throat.  Avoid mint products. Begin Pepcid 20 mg at bedtime  If symptoms are not improving will need office visit or telemedicine visit for further evaluation, and repeat imaging.  Refer to ENT for chronic hoarseness  Please contact office for sooner follow up if symptoms do not improve or worsen or seek emergency care    Will send to Dr. Erin Fulling for FYI   Please make sure that she has a follow-up visit with Dr. Erin Fulling in the next few weeks

## 2020-02-03 NOTE — Telephone Encounter (Signed)
Called and spoke with patient, advised of recommendations per Catha Gosselin NP.  She verbalized understanding.  Patient to get covid tested and advise our office if the result is positive.  Nothing further needed.

## 2020-02-03 NOTE — Addendum Note (Signed)
Addended by: Vanessa Barbara on: 02/03/2020 04:34 PM   Modules accepted: Orders

## 2020-02-03 NOTE — Telephone Encounter (Signed)
Spoke with the pt  She was seen 01/25/20 by Dr Erin Fulling- given pred taper Instructions    Return if symptoms worsen or fail to improve. Prednisone Taper: 23m daily x 3 days 344mdaily x 3 days 2040maily x 3 days 49m27mily x 3 days   Use albuterol 1-2 puffs every 4-6 hours as needed for cough   Please call if symptoms not improved     She reports that her cough has improved- occ will have some cough with min clear sputum  She is concerned that her voice is still very hoarse  Denies sore throat or diff swallowing She reports that she has "felt feverish"- wakes up with sweats- temp 98.0 today when she checked   She also has lack of energy  She has 3 days left on pred 10 mg  She is using the albuterol a couple times per day  She wants to know what else she can do  I advised will get msg to Dr DewaErin Fulling recs and in the meantime try and rest voice as much as she can and make sure well hydrated  Please advise, thanks!

## 2020-02-04 NOTE — Telephone Encounter (Signed)
Dr. Erin Fulling, please see mychart message sent by pt and advise. Also please see phone encounter from yesterday 02/03/20 which this is also about.

## 2020-02-07 MED ORDER — ALBUTEROL SULFATE HFA 108 (90 BASE) MCG/ACT IN AERS: 2.0000 | INHALATION_SPRAY | RESPIRATORY_TRACT | 4 refills | Status: DC | PRN

## 2020-02-07 NOTE — Telephone Encounter (Signed)
Received the following message from patient:   "I am using nebulizer with albuterol every 4 to 6 hours. If I still need inhalers [facility] [address].   Do I need a referral from doctor for ENT??    I was also wondering about the advair inhaler purple pack  and singular at night or updated medication that will help my bronchial asthma prentatives. "  I refilled her albuterol inhaler and advised her that the referral to [facility] had been placed as suggested by TP.   She is now asking about Singulair and Advair.   Dr. Erin Fulling, can you please advise? Thanks!

## 2020-02-11 DIAGNOSIS — K148 Other diseases of tongue: Secondary | ICD-10-CM | POA: Diagnosis not present

## 2020-02-11 DIAGNOSIS — H938X2 Other specified disorders of left ear: Secondary | ICD-10-CM | POA: Diagnosis not present

## 2020-02-11 DIAGNOSIS — K219 Gastro-esophageal reflux disease without esophagitis: Secondary | ICD-10-CM | POA: Diagnosis not present

## 2020-02-11 DIAGNOSIS — J45909 Unspecified asthma, uncomplicated: Secondary | ICD-10-CM | POA: Diagnosis not present

## 2020-02-14 ENCOUNTER — Encounter: Payer: Self-pay | Admitting: Family Medicine

## 2020-02-23 ENCOUNTER — Telehealth: Payer: Self-pay | Admitting: Physician Assistant

## 2020-02-23 ENCOUNTER — Other Ambulatory Visit: Payer: Self-pay | Admitting: Physician Assistant

## 2020-02-23 MED ORDER — VENLAFAXINE HCL ER 150 MG PO CP24: ORAL_CAPSULE | ORAL | 0 refills | Status: DC

## 2020-02-23 NOTE — Telephone Encounter (Signed)
Next appt is 04/14/20. Requesting refill on Venlafaxine called to [id number], Phone # is [phone].

## 2020-02-23 NOTE — Telephone Encounter (Signed)
Prescription was sent

## 2020-02-23 NOTE — Telephone Encounter (Signed)
Next appt is 04/14/20. Requesting refill for Venlafaxine called to [id number]. Phone [phone].

## 2020-04-14 ENCOUNTER — Other Ambulatory Visit: Payer: Self-pay

## 2020-04-14 ENCOUNTER — Ambulatory Visit: Payer: BC Managed Care – PPO | Admitting: Family Medicine

## 2020-04-14 ENCOUNTER — Ambulatory Visit (INDEPENDENT_AMBULATORY_CARE_PROVIDER_SITE_OTHER): Payer: BC Managed Care – PPO | Admitting: Physician Assistant

## 2020-04-14 ENCOUNTER — Encounter: Payer: Self-pay | Admitting: Family Medicine

## 2020-04-14 ENCOUNTER — Encounter: Payer: Self-pay | Admitting: Physician Assistant

## 2020-04-14 VITALS — BP 148/88 | HR 88 | Temp 98.2°F | Ht 60.0 in | Wt 218.4 lb

## 2020-04-14 DIAGNOSIS — F411 Generalized anxiety disorder: Secondary | ICD-10-CM | POA: Diagnosis not present

## 2020-04-14 DIAGNOSIS — R8290 Unspecified abnormal findings in urine: Secondary | ICD-10-CM

## 2020-04-14 DIAGNOSIS — F4310 Post-traumatic stress disorder, unspecified: Secondary | ICD-10-CM | POA: Diagnosis not present

## 2020-04-14 DIAGNOSIS — F3341 Major depressive disorder, recurrent, in partial remission: Secondary | ICD-10-CM | POA: Diagnosis not present

## 2020-04-14 DIAGNOSIS — R030 Elevated blood-pressure reading, without diagnosis of hypertension: Secondary | ICD-10-CM | POA: Diagnosis not present

## 2020-04-14 DIAGNOSIS — E6609 Other obesity due to excess calories: Secondary | ICD-10-CM | POA: Diagnosis not present

## 2020-04-14 LAB — POCT URINALYSIS DIPSTICK
Blood, UA: NEGATIVE
Glucose, UA: NEGATIVE
Ketones, UA: NEGATIVE
Nitrite, UA: NEGATIVE
Protein, UA: NEGATIVE
Spec Grav, UA: 1.02 (ref 1.010–1.025)
Urobilinogen, UA: 1 E.U./dL
pH, UA: 6 (ref 5.0–8.0)

## 2020-04-14 MED ORDER — ALPRAZOLAM 0.5 MG PO TABS: 0.2500 mg | ORAL_TABLET | Freq: Two times a day (BID) | ORAL | 1 refills | Status: DC | PRN

## 2020-04-14 MED ORDER — VENLAFAXINE HCL ER 150 MG PO CP24: ORAL_CAPSULE | ORAL | 1 refills | Status: DC

## 2020-04-14 NOTE — Progress Notes (Signed)
Patient: Sandra Freeman MRN: [id number] DOB: 1960-09-09 PCP: Orma Flaming, MD     Subjective:  Chief Complaint  Patient presents with  . Odor    Pt says that she has a very strong odor, she does not know where it is coming from. She says that she smells it all the time. She can smell it very loud in her urine.    HPI: The patient is a 60 y.o. female who presents today for what she describes as a musty odor (urine).  She states it has smelled strange since early February.  She is not sexually active in years.  She states everything has this smell. She is taking no vitamins, she states she drinks water and no change in her food.  Its made her very insecure. She has no dysuria, urgency or frequency. She can smell this smell when she eats, in her home. Can't drink coffee from this smell. She did have covid last year and her smell has been off since this time. She has no memory deficits, pill rolling tremors. Her only complaint is her smell.    Review of Systems  Constitutional: Negative for chills, fatigue and fever.  HENT: Negative for dental problem, ear pain, hearing loss and trouble swallowing.   Eyes: Negative for visual disturbance.  Respiratory: Negative for cough, chest tightness and shortness of breath.   Cardiovascular: Negative for chest pain, palpitations and leg swelling.  Gastrointestinal: Negative for abdominal pain, blood in stool, diarrhea and nausea.  Endocrine: Negative for cold intolerance, polydipsia, polyphagia and polyuria.  Genitourinary: Negative for difficulty urinating, dysuria, flank pain, frequency, hematuria and vaginal discharge.  Musculoskeletal: Negative for arthralgias.  Skin: Negative for rash.  Neurological: Negative for dizziness, tremors, weakness and headaches.  Psychiatric/Behavioral: Negative for dysphoric mood and sleep disturbance. The patient is not nervous/anxious.     Allergies Patient is allergic to egg [eggs or egg-derived products],  doxycycline, hydroxyzine, and seroquel [quetiapine].  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  Surgical History Patient  has a past surgical history that includes Tubal ligation; Knee surgery; Cataract extraction; Posterior cervical laminectomy; Knee arthroscopy; and Anterior fusion cervical spine.  Family History Pateint's family history includes Alcohol abuse in her father, maternal grandfather, and maternal grandmother; COPD in her mother; Cancer in her father and mother; Cirrhosis in her maternal grandmother; Depression in her cousin; Diabetes in her father, half-brother, half-brother, and paternal grandmother; Heart disease in her father, half-brother, maternal grandfather, and paternal grandfather.  Social History Patient  reports that she has never smoked. She has never used smokeless tobacco. She reports current alcohol use of about 1.0 standard drink of alcohol per week. She reports that she does not use drugs.    Objective: Vitals:   04/14/20 0928 04/14/20 1002  BP: (!) 150/78 (!) 148/88  Pulse: 88   Temp: 98.2 F (36.8 C)   TempSrc: Temporal   SpO2: 97%   Weight: 218 lb 6.4 oz (99.1 kg)   Height: 5' (1.524 m)     Body mass index is 42.65 kg/m.  Physical Exam Vitals reviewed.  Constitutional:      Appearance:  Normal appearance. She is obese.     Comments: No odor   HENT:     Head: Normocephalic and  atraumatic.  Cardiovascular:     Rate and Rhythm: Normal rate and regular rhythm.     Heart sounds: Normal heart sounds.  Pulmonary:     Effort: Pulmonary effort is normal.     Breath sounds: Normal breath sounds.  Abdominal:     General: Bowel sounds are normal.     Palpations: Abdomen is soft.     Tenderness: There is no abdominal tenderness. There is no right CVA tenderness or left CVA tenderness.  Skin:    Capillary Refill: Capillary refill takes less than 2 seconds.  Neurological:     General: No focal deficit present.     Mental Status: She is alert and oriented to person, place, and time.     Cranial Nerves: No cranial nerve deficit.     Sensory: No sensory deficit.     Motor: No weakness.     Coordination: Coordination normal.     Deep Tendon Reflexes: Reflexes normal.  Psychiatric:        Mood and Affect: Mood normal.        Behavior: Behavior normal.    UA normal      Assessment/plan: 1. Bad odor of urine Urine normal and from history sounds more like covid olfactory issues. Discussed we don't know much about how long this could last, can come and go. No other concerning neurological deficits. precautions given.  - POCT Urinalysis Dipstick - Urine Culture  2. Elevated blood pressure -has been to goal. F/u in one month for annual. She is working hard on weight loss and diet.   This visit occurred during the SARS-CoV-2 public health emergency.  Safety protocols were in place, including screening questions prior to the visit, additional usage of staff PPE, and extensive cleaning of exam room while observing appropriate contact time as indicated for disinfecting solutions.     Return in about 1 month (around 05/15/2020) for annual with wolfe .   Orma Flaming, MD [place]   04/14/2020

## 2020-04-14 NOTE — Progress Notes (Signed)
[facility]  Patient ID: Sandra Freeman,  MRN: [id number]  PCP: Orma Flaming, MD  Date of Evaluation: 04/14/2020 Time spent:20 minutes  Chief Complaint:  Chief Complaint    Anxiety; Depression      HISTORY/CURRENT STATUS: HPI  Overdue for routine med check.  Doing pretty well. Had covid in April of last year and still has problems from it. Fatigue, asthma has been worse, SOB can be bad at times, brain fog. Has seen pulmonology and ENT after she lost her voice. Only got her voice back about a month ago. All of her sx are much better than they were but still deals with asthma that has worsened after COVID.  Patient denies loss of interest in usual activities and is able to enjoy things.  Denies decreased energy or motivation.  Appetite has not changed.  No extreme sadness, tearfulness, or feelings of hopelessness.  Denies any changes in concentration, making decisions or remembering things.  Denies suicidal or homicidal thoughts.  When she was really sick, she needed the Xanax more often. But she rarely takes it otherwise. Doesn't like to take it, but when PTSD hits, she'll take one.   Patient denies increased energy with decreased need for sleep, no increased talkativeness, no racing thoughts, no impulsivity or risky behaviors, no increased spending, no increased libido, no grandiosity, no increased irritability or anger, and no hallucinations.  Denies dizziness, syncope, seizures, numbness, tingling, tremor, tics, unsteady gait, slurred speech, confusion. Denies muscle or joint pain, stiffness, or dystonia.  Individual Medical History/ Review of Systems: Changes? :Yes   Allergies: Egg [eggs or egg-derived products], Doxycycline, Hydroxyzine, and Seroquel [quetiapine]  Current Medications:  Current Outpatient Medications:  .  albuterol (PROVENTIL) (2.5 MG/3ML) 0.083% nebulizer solution, Take 3 mLs (2.5 mg total) by nebulization every 6 (six) hours as needed for  wheezing or shortness of breath., Disp: 150 mL, Rfl: 1 .  albuterol (VENTOLIN HFA) 108 (90 Base) MCG/ACT inhaler, Inhale 2 puffs into the lungs every 4 (four) hours as needed for wheezing or shortness of breath., Disp: 6.7 g, Rfl: 4 .  clotrimazole-betamethasone (LOTRISONE) cream, Apply 1 application topically 2 (two) times daily., Disp: 30 g, Rfl: 3 .  diclofenac sodium (VOLTAREN) 1 % GEL, Apply 3 gm to 3 large joints up to 3 times a day.Dispense 3 tubes with 3 refills., Disp: 3 Tube, Rfl: 1 .  fluticasone (FLONASE) 50 MCG/ACT nasal spray, Place 1 spray into both nostrils daily., Disp: 16 g, Rfl: 2 .  pantoprazole (PROTONIX) 40 MG tablet, Take 1 tablet (40 mg total) by mouth daily., Disp: 90 tablet, Rfl: 1 .  ALPRAZolam (XANAX) 0.5 MG tablet, Take 0.5-1 tablets (0.25-0.5 mg total) by mouth 2 (two) times daily as needed for anxiety., Disp: 30 tablet, Rfl: 1 .  venlafaxine XR (EFFEXOR-XR) 150 MG 24 hr capsule, TAKE 2 CAPSULES BY MOUTH ONCE DAILY WITH BREAKFAST, Disp: 180 capsule, Rfl: 1 Medication Side Effects: none  Family Medical/ Social History: Changes? No  MENTAL HEALTH EXAM:  Last menstrual period 01/04/2010.There is no height or weight on file to calculate BMI.  General Appearance: Casual, Well Groomed and Obese  Eye Contact:  Good  Speech:  Clear and Coherent and Normal Rate  Volume:  Normal  Mood:  Euthymic  Affect:  Appropriate  Thought Process:  Goal Directed and Descriptions of Associations: Intact  Orientation:  Full (Time, Place, and Person)  Thought Content: Logical   Suicidal Thoughts:  No  Homicidal Thoughts:  No  Memory:  WNL  Judgement:  Good  Insight:  Good  Psychomotor Activity:  Normal  Concentration:  Concentration: Good  Recall:  Good  Fund of Knowledge: Good  Language: Good  Assets:  Desire for Improvement  ADL's:  Intact  Cognition: WNL  Prognosis:  Good    DIAGNOSES:    ICD-10-CM   1. Recurrent major depressive disorder, in partial remission ([place])   F33.41   2. Generalized anxiety disorder  F41.1   3. PTSD (post-traumatic stress disorder)  F43.10   4. Obesity due to excess calories without serious comorbidity, unspecified classification  E66.09     Receiving Psychotherapy: No    RECOMMENDATIONS:  PDMP reviewed. I provided 20 minutes of face-to-face time during this encounter, including time spent before and after the visit and records review and charting. She is doing well as far as medications are concerned and no changes are necessary. Continue Xanax 0.5 mg, 1/2-1 twice daily as needed.  She rarely uses and has not had it filled since February 2021. Continue Effexor XR 150 mg, 2 p.o. every morning. Return in 6 months.   Donnal Moat, PA-C

## 2020-04-16 LAB — URINE CULTURE
MICRO NUMBER:: 11693242
Result:: NO GROWTH
SPECIMEN QUALITY:: ADEQUATE

## 2020-04-29 ENCOUNTER — Encounter: Payer: Self-pay | Admitting: Family Medicine

## 2020-05-15 ENCOUNTER — Encounter: Payer: BC Managed Care – PPO | Admitting: Family Medicine

## 2020-06-02 DIAGNOSIS — M7662 Achilles tendinitis, left leg: Secondary | ICD-10-CM | POA: Diagnosis not present

## 2020-06-07 ENCOUNTER — Encounter: Payer: Self-pay | Admitting: Family Medicine

## 2020-06-13 ENCOUNTER — Other Ambulatory Visit: Payer: Self-pay | Admitting: Family Medicine

## 2020-07-27 ENCOUNTER — Other Ambulatory Visit: Payer: Self-pay | Admitting: Physician Assistant

## 2020-08-04 DIAGNOSIS — M7662 Achilles tendinitis, left leg: Secondary | ICD-10-CM | POA: Diagnosis not present

## 2020-09-04 DIAGNOSIS — M7662 Achilles tendinitis, left leg: Secondary | ICD-10-CM | POA: Diagnosis not present

## 2020-09-13 ENCOUNTER — Other Ambulatory Visit: Payer: Self-pay | Admitting: Orthopaedic Surgery

## 2020-09-13 DIAGNOSIS — M7662 Achilles tendinitis, left leg: Secondary | ICD-10-CM

## 2020-09-24 ENCOUNTER — Ambulatory Visit
Admission: RE | Admit: 2020-09-24 | Discharge: 2020-09-24 | Disposition: A | Discharge status: Home / Self Care | Payer: BC Managed Care – PPO | Source: Ambulatory Visit | Attending: Orthopaedic Surgery | Admitting: Orthopaedic Surgery

## 2020-09-24 DIAGNOSIS — R600 Localized edema: Secondary | ICD-10-CM | POA: Diagnosis not present

## 2020-09-24 DIAGNOSIS — M19072 Primary osteoarthritis, left ankle and foot: Secondary | ICD-10-CM | POA: Diagnosis not present

## 2020-09-24 DIAGNOSIS — M25472 Effusion, left ankle: Secondary | ICD-10-CM | POA: Diagnosis not present

## 2020-09-24 DIAGNOSIS — M7662 Achilles tendinitis, left leg: Secondary | ICD-10-CM

## 2020-10-04 DIAGNOSIS — M7662 Achilles tendinitis, left leg: Secondary | ICD-10-CM | POA: Diagnosis not present

## 2020-10-16 ENCOUNTER — Ambulatory Visit: Payer: BC Managed Care – PPO | Admitting: Physician Assistant

## 2020-11-21 ENCOUNTER — Other Ambulatory Visit: Payer: Self-pay

## 2020-11-21 ENCOUNTER — Other Ambulatory Visit: Payer: Self-pay | Admitting: Family Medicine

## 2021-01-10 ENCOUNTER — Other Ambulatory Visit: Payer: Self-pay

## 2021-01-10 ENCOUNTER — Ambulatory Visit: Payer: BC Managed Care – PPO | Admitting: Family Medicine

## 2021-01-10 ENCOUNTER — Encounter: Payer: Self-pay | Admitting: Family Medicine

## 2021-01-10 VITALS — BP 140/86 | HR 74 | Temp 98.1°F | Ht 60.0 in | Wt 185.0 lb

## 2021-01-10 DIAGNOSIS — Z0000 Encounter for general adult medical examination without abnormal findings: Secondary | ICD-10-CM

## 2021-01-10 DIAGNOSIS — L659 Nonscarring hair loss, unspecified: Secondary | ICD-10-CM

## 2021-01-10 DIAGNOSIS — L309 Dermatitis, unspecified: Secondary | ICD-10-CM | POA: Insufficient documentation

## 2021-01-10 DIAGNOSIS — L2084 Intrinsic (allergic) eczema: Secondary | ICD-10-CM

## 2021-01-10 MED ORDER — CLOTRIMAZOLE-BETAMETHASONE 1-0.05 % EX CREA: 1.0000 "application " | TOPICAL_CREAM | Freq: Two times a day (BID) | CUTANEOUS | 3 refills | Status: AC

## 2021-01-10 NOTE — Progress Notes (Signed)
Subjective:     Patient ID: Sandra Freeman, female    DOB: December 21, 1960, 60 y.o.   MRN: [id number]  Chief Complaint  Patient presents with   Hair/Scalp Problem    Hair falling out started 2 months ago    Skin issue    Very dry skin    Fatigue    HPI Hair falling out-quit taking gabapentin(for plantar fascitis as thought maybe from that. May have started after started gabapentin.   Hair getting thinner, no specific patch.  Bruising easily now-minor trauma.  Chills at times for few minutes. Some fatigue, dry skin. No sugery/procedures in past 6 mo. No diarrhea/constipation. Harder to sleep. Intermitt palp for long time, but coming back more.   PTSD-on meds and in counseling.  Not worsening symptoms.  But this is anniversary time. No SI  Had covid 04/2020-still some residual-taste, etc. So not eating as much and losing wt  Health Maintenance Due  Topic Date Due   Pneumococcal Vaccine 27-56 Years old (1 - PCV) Never done   TETANUS/TDAP  Never done   PAP SMEAR-Modifier  01/21/2001   Zoster Vaccines- Shingrix (1 of 2) Never done   COLONOSCOPY (Pts 45-40yr Insurance coverage will need to be confirmed)  06/09/2017   MAMMOGRAM  08/28/2017    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   WRIST SPRAIN, RIGHT 05/23/2008    Past Surgical History:  Procedure Laterality Date   ANTERIOR FUSION CERVICAL SPINE     CATARACT EXTRACTION     KNEE ARTHROSCOPY     left   KNEE  SURGERY     POSTERIOR CERVICAL LAMINECTOMY     TUBAL LIGATION      Outpatient Medications Prior to Visit  Medication Sig Dispense Refill   albuterol (PROVENTIL) (2.5 MG/3ML) 0.083% nebulizer solution Take 3 mLs (2.5 mg total) by nebulization every 6 (six) hours as needed for wheezing or shortness of breath. 150 mL 1   albuterol (VENTOLIN HFA) 108 (90 Base) MCG/ACT inhaler Inhale 2 puffs into the lungs every 4 (four) hours as needed for wheezing or shortness of breath. 6.7 g 4   ALPRAZolam (XANAX) 0.5 MG tablet Take 0.5-1 tablets (0.25-0.5 mg total) by mouth 2 (two) times daily as needed for anxiety. 30 tablet 1   diclofenac sodium (VOLTAREN) 1 % GEL Apply 3 gm to 3 large joints up to 3 times a day.Dispense 3 tubes with 3 refills. 3 Tube 1   fluticasone (FLONASE) 50 MCG/ACT nasal spray Place 1 spray into both nostrils daily. 16 g 2   pantoprazole (PROTONIX) 40 MG tablet Take 1 tablet by mouth once daily 30 tablet 0   venlafaxine XR (EFFEXOR-XR) 150 MG 24 hr capsule TAKE 2 CAPSULES BY MOUTH ONCE DAILY WITH BREAKFAST 180 capsule 0   clotrimazole-betamethasone (LOTRISONE) cream Apply 1 application topically 2 (two) times daily. 30 g 3   nitroGLYCERIN (NITRODUR - DOSED IN MG/24 HR) 0.2 mg/hr patch SMARTSIG:0.25-0.5 Patch(s) Topical Daily  gabapentin (NEURONTIN) 300 MG capsule Take by mouth. (Patient not taking: Reported on 01/10/2021)     MOBIC 15 MG tablet Take 15 mg by mouth daily. (Patient not taking: Reported on 01/10/2021)     No facility-administered medications prior to visit.    Allergies  Allergen Reactions   Albumen, Egg Rash    Rash, swelling   Egg [Eggs Or Egg-Derived Products] Other (See Comments)    Rash, swelling   Doxycycline    Hydroxyzine Other (See Comments)    Extreme sedation   Seroquel [Quetiapine] Other (See Comments)    dizzy   QPR:[id number] except as noted in HPI      Objective:     BP 140/86    Pulse 74    Temp 98.1 F (36.7 C)  (Temporal)    Ht 5' (1.524 m)    Wt 156 lb 4 oz (70.9 kg)    LMP 01/04/2010 (LMP Unknown)    PF 98 L/min    BMI 30.52 kg/m  Wt Readings from Last 3 Encounters:  01/10/21 156 lb 4 oz (70.9 kg)  04/14/20 218 lb 6.4 oz (99.1 kg)  01/25/20 210 lb 12.8 oz (95.6 kg)        Gen: WDWN NAD wf HEENT: NCAT, conjunctiva not injected, sclera nonicteric NECK:  supple, no thyromegaly, no nodes, no carotid bruits CARDIAC: RRR, S1S2+, no murmur. DP 2+B LUNGS: CTAB. No wheezes ABDOMEN:  BS+, soft, NTND, No HSM, no masses EXT:  no edema MSK: no gross abnormalities.  NEURO: A&O x3.  CN II-XII intact.  PSYCH: normal mood. Good eye contact Hair-no patches of alopecia.  Hair is dry.  Few strands pull out easily  Assessment & Plan:   Problem List Items Addressed This Visit       Musculoskeletal and Integument   Eczema   Other Visit Diagnoses     Hair loss    -  Primary   Relevant Orders   CBC   Comprehensive metabolic panel   Hemoglobin A1c   TSH   Iron   T4, free   Ferritin   Annual physical exam       Relevant Medications   clotrimazole-betamethasone (LOTRISONE) cream   Other Relevant Orders   Lipid Profile      Hair loss-could be from meds, thyroid, low iron, other.  Pt has stopped newest med-gabapentin.  No hair and nail supps for 1 wk so stay off to check thyroid.  Check iron,cbc,cmp,tsh, aic.  Further plan pending labs. Eczema-renewed meds Returning in Jan for CPX  Meds ordered this encounter  Medications   clotrimazole-betamethasone (LOTRISONE) cream    Sig: Apply 1 application topically 2 (two) times daily.    Dispense:  30 g    Refill:  3    Wellington Hampshire., MD

## 2021-01-10 NOTE — Patient Instructions (Signed)
It was very nice to see you today!  Merry Christmas   PLEASE NOTE:  If you had any lab tests please let us know if you have not heard back within a few days. You may see your results on MyChart before we have a chance to review them but we will give you a call once they are reviewed by [place]. If we ordered any referrals today, please let us know if you have not heard from their office within the next week.   Please try these tips to maintain a healthy lifestyle:  Eat most of your calories during the day when you are active. Eliminate processed foods including packaged sweets (pies, cakes, cookies), reduce intake of potatoes, white bread, white pasta, and white rice. Look for whole grain options, oat flour or almond flour.  Each meal should contain half fruits/vegetables, one quarter protein, and one quarter carbs (no bigger than a computer mouse).  Cut down on sweet beverages. This includes juice, soda, and sweet tea. Also watch fruit intake, though this is a healthier sweet option, it still contains natural sugar! Limit to 3 servings daily.  Drink at least 1 glass of water with each meal and aim for at least 8 glasses per day  Exercise at least 150 minutes every week.

## 2021-01-16 ENCOUNTER — Other Ambulatory Visit: Payer: BC Managed Care – PPO

## 2021-01-17 ENCOUNTER — Other Ambulatory Visit: Payer: Self-pay

## 2021-01-17 ENCOUNTER — Other Ambulatory Visit (INDEPENDENT_AMBULATORY_CARE_PROVIDER_SITE_OTHER): Payer: BC Managed Care – PPO

## 2021-01-17 DIAGNOSIS — L659 Nonscarring hair loss, unspecified: Secondary | ICD-10-CM | POA: Diagnosis not present

## 2021-01-17 DIAGNOSIS — Z0000 Encounter for general adult medical examination without abnormal findings: Secondary | ICD-10-CM | POA: Diagnosis not present

## 2021-01-17 LAB — COMPREHENSIVE METABOLIC PANEL
ALT: 20 U/L (ref 0–35)
AST: 26 U/L (ref 0–37)
Albumin: 4.3 g/dL (ref 3.5–5.2)
Alkaline Phosphatase: 83 U/L (ref 39–117)
BUN: 7 mg/dL (ref 6–23)
CO2: 27 mEq/L (ref 19–32)
Calcium: 9.8 mg/dL (ref 8.4–10.5)
Chloride: 107 mEq/L (ref 96–112)
Creatinine, Ser: 0.7 mg/dL (ref 0.40–1.20)
GFR: 92.41 mL/min (ref >60.00)
Glucose, Bld: 95 mg/dL (ref 70–99)
Potassium: 4.4 mEq/L (ref 3.5–5.1)
Sodium: 143 mEq/L (ref 135–145)
Total Bilirubin: 1.3 mg/dL — ABNORMAL HIGH (ref 0.2–1.2)
Total Protein: 6.9 g/dL (ref 6.0–8.3)

## 2021-01-17 LAB — IBC + FERRITIN
Ferritin: 26.5 ng/mL (ref 10.0–291.0)
Iron: 132 ug/dL (ref 42–145)
Saturation Ratios: 31.9 % (ref 20.0–50.0)
TIBC: 414.4 ug/dL (ref 250.0–450.0)
Transferrin: 296 mg/dL (ref 212.0–360.0)

## 2021-01-17 LAB — CBC
HCT: 42.2 % (ref 36.0–46.0)
Hemoglobin: 14.2 g/dL (ref 12.0–15.0)
MCHC: 33.7 g/dL (ref 30.0–36.0)
MCV: 91.2 fl (ref 78.0–100.0)
Platelets: 136 10*3/uL — ABNORMAL LOW (ref 150.0–400.0)
RBC: 4.63 Mil/uL (ref 3.87–5.11)
RDW: 13 % (ref 11.5–15.5)
WBC: 5.2 10*3/uL (ref 4.0–10.5)

## 2021-01-17 LAB — LIPID PANEL
Cholesterol: 164 mg/dL (ref 0–200)
HDL: 56.5 mg/dL (ref >39.00)
LDL Cholesterol: 89 mg/dL (ref 0–99)
NonHDL: 107.63
Total CHOL/HDL Ratio: 3
Triglycerides: 94 mg/dL (ref 0.0–149.0)
VLDL: 18.8 mg/dL (ref 0.0–40.0)

## 2021-01-17 LAB — HEMOGLOBIN A1C: Hgb A1c MFr Bld: 4.9 % (ref 4.6–6.5)

## 2021-01-17 LAB — T4, FREE: Free T4: 0.78 ng/dL (ref 0.60–1.60)

## 2021-01-17 LAB — TSH: TSH: 1.27 u[IU]/mL (ref 0.35–5.50)

## 2021-01-31 ENCOUNTER — Encounter: Payer: BC Managed Care – PPO | Admitting: Family Medicine

## 2021-03-07 ENCOUNTER — Other Ambulatory Visit: Payer: Self-pay | Admitting: Family Medicine

## 2021-03-12 ENCOUNTER — Other Ambulatory Visit: Payer: Self-pay | Admitting: Family Medicine

## 2021-03-12 ENCOUNTER — Encounter: Payer: Self-pay | Admitting: Family Medicine

## 2021-03-12 MED ORDER — BENZONATATE 100 MG PO CAPS: 200.0000 mg | ORAL_CAPSULE | Freq: Three times a day (TID) | ORAL | 1 refills | Status: DC | PRN

## 2021-03-16 ENCOUNTER — Ambulatory Visit: Payer: BC Managed Care – PPO | Admitting: Family Medicine

## 2021-03-16 ENCOUNTER — Other Ambulatory Visit: Payer: Self-pay

## 2021-03-16 ENCOUNTER — Encounter: Payer: Self-pay | Admitting: Family Medicine

## 2021-03-16 VITALS — BP 135/83 | HR 73 | Temp 98.3°F | Ht 60.0 in | Wt 185.1 lb

## 2021-03-16 DIAGNOSIS — R059 Cough, unspecified: Secondary | ICD-10-CM | POA: Diagnosis not present

## 2021-03-16 DIAGNOSIS — H04203 Unspecified epiphora, bilateral lacrimal glands: Secondary | ICD-10-CM

## 2021-03-16 DIAGNOSIS — R0981 Nasal congestion: Secondary | ICD-10-CM | POA: Diagnosis not present

## 2021-03-16 LAB — POC COVID19 BINAXNOW: SARS Coronavirus 2 Ag: NEGATIVE

## 2021-03-16 MED ORDER — PREDNISONE 20 MG PO TABS: 40.0000 mg | ORAL_TABLET | Freq: Every day | ORAL | 0 refills | Status: AC

## 2021-03-16 MED ORDER — HYDROCOD POLI-CHLORPHE POLI ER 10-8 MG/5ML PO SUER: 5.0000 mL | Freq: Two times a day (BID) | ORAL | 0 refills | Status: DC | PRN

## 2021-03-16 NOTE — Patient Instructions (Signed)
Meds have been sent the the pharmacy °You can take tylenol for pain/fevers °If worsening symptoms, let us know or go to the Emergency room  ° ° °

## 2021-03-16 NOTE — Progress Notes (Signed)
Subjective:     Patient ID: Sandra Freeman, female    DOB: [birth year], 61 y.o.   MRN: [id number]  Chief Complaint  Patient presents with   Cough    Ictchy cough Sx started 1 week ago Took covid test 3 days ago, negative    Watery eyes   Nasal Congestion    HPI Chief complaint: cough, congestion,watery eyes Symptom onset: 1 wk Pertinent positives: "itchy cough" x 1 wk, congestion, watery eyes and itchy.  Long h/o asthma/bronchitis, sore throat from dry cough.  Was sitting outside 1 wk ago and then came in and hoarse.   A little more sob Pertinent negatives: no f/c,but 1-2 nights had hot and cold/sweats/nightmare.  Covid neg x 2 Treatments tried: OTC Vaccine status: had covid and got IVIG so only 1 covid shot. Sick exposure: none   Health Maintenance Due  Topic Date Due   TETANUS/TDAP  Never done   PAP SMEAR-Modifier  01/21/2001   Zoster Vaccines- Shingrix (1 of 2) Never done   COLONOSCOPY (Pts 45-6yr Insurance coverage will need to be confirmed)  06/09/2017   MAMMOGRAM  08/28/2017   COVID-19 Vaccine (2 - Pfizer series) 09/29/2019    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   WRIST SPRAIN, RIGHT 05/23/2008    Past Surgical History:  Procedure Laterality Date   ANTERIOR FUSION CERVICAL SPINE     CATARACT EXTRACTION     KNEE ARTHROSCOPY     left   KNEE SURGERY     POSTERIOR CERVICAL LAMINECTOMY     TUBAL LIGATION      Outpatient Medications Prior to  Visit  Medication Sig Dispense Refill   albuterol (PROVENTIL) (2.5 MG/3ML) 0.083% nebulizer solution Take 3 mLs (2.5 mg total) by nebulization every 6 (six) hours as needed for wheezing or shortness of breath. 150 mL 1   albuterol (VENTOLIN HFA) 108 (90 Base) MCG/ACT inhaler Inhale 2 puffs into the lungs every 4 (four) hours as needed for wheezing or shortness of breath. 6.7 g 4   ALPRAZolam (XANAX) 0.5 MG tablet Take 0.5-1 tablets (0.25-0.5 mg total) by mouth 2 (two) times daily as needed for anxiety. 30 tablet 1   clotrimazole-betamethasone (LOTRISONE) cream Apply 1 application topically 2 (two) times daily. 30 g 3   diclofenac sodium (VOLTAREN) 1 % GEL Apply 3 gm to 3 large joints up to 3 times a day.Dispense 3 tubes with 3 refills. 3 Tube 1   fluticasone (FLONASE) 50 MCG/ACT nasal spray Place 1 spray into both nostrils daily. 16 g 2   pantoprazole (PROTONIX) 40 MG tablet TAKE 1 TABLET BY MOUTH ONCE DAILY. CARE  NEEDED  TO  BE  ESTABLISHED  WITH  NEW  PCP  FOR  FURTHER  REFILLS 30 tablet 0   venlafaxine XR (EFFEXOR-XR) 150 MG 24 hr capsule TAKE 2 CAPSULES BY MOUTH ONCE DAILY WITH BREAKFAST 180 capsule 0   benzonatate (TESSALON) 100 MG capsule Take 2 capsules (200 mg total)  by mouth 3 (three) times daily as needed for cough. (Patient not taking: Reported on 03/16/2021) 20 capsule 1   No facility-administered medications prior to visit.    Allergies  Allergen Reactions   Egg White (Egg Protein) Rash    Rash, swelling   Egg [Eggs Or Egg-Derived Products] Other (See Comments)    Rash, swelling   Doxycycline    Hydroxyzine Other (See Comments)    Extreme sedation   Seroquel [Quetiapine] Other (See Comments)    dizzy   ROS neg/noncontributory except as noted HPI/below      Objective:     BP 135/83    Pulse 73    Temp 98.3 F (36.8 C) (Temporal)    Ht 5' (1.524 m)    Wt 185 lb 2 oz (84 kg)    LMP 01/04/2010 (LMP Unknown)    BMI 36.15 kg/m  Wt Readings from Last 3 Encounters:   03/16/21 185 lb 2 oz (84 kg)  01/10/21 185 lb (83.9 kg)  04/14/20 218 lb 6.4 oz (99.1 kg)        Gen: WDWN NAD [facility] HEENT: NCAT, conjunctiva slightlyt injected, sclera nonicteric TM WNL B, OP moist, no exudates . Mild congestion.  Watery eyes NECK:  supple, no thyromegaly, no nodes, no carotid bruits CARDIAC: RRR, S1S2+, no murmur.  LUNGS: CTAB. No wheezes EXT:  no edema MSK: no gross abnormalities.  NEURO: A&O x3.  CN II-XII intact.  PSYCH: normal mood. Good eye contact  Results for orders placed or performed in visit on 03/16/21  POC COVID-19  Result Value Ref Range   SARS Coronavirus 2 Ag Negative Negative     Assessment & Plan:   Problem List Items Addressed This Visit   None Visit Diagnoses     Cough, unspecified type    -  Primary   Relevant Orders   POC COVID-19 (Completed)   Watery eyes       Relevant Orders   POC COVID-19 (Completed)   Nasal congestion       Relevant Orders   POC COVID-19 (Completed)      URI-most likely viral.  Symptomatic tx. Requested tussionex.  Has tessalon perles.  Pred to hold if asthma flares more  No orders of the defined types were placed in this encounter.   Wellington Hampshire, MD

## 2021-05-02 ENCOUNTER — Ambulatory Visit: Payer: BC Managed Care – PPO | Admitting: Family Medicine

## 2021-05-02 ENCOUNTER — Encounter: Payer: Self-pay | Admitting: Family Medicine

## 2021-05-02 VITALS — BP 100/60 | HR 54 | Temp 98.1°F | Ht 60.0 in | Wt 183.2 lb

## 2021-05-02 DIAGNOSIS — F4310 Post-traumatic stress disorder, unspecified: Secondary | ICD-10-CM | POA: Diagnosis not present

## 2021-05-02 DIAGNOSIS — D691 Qualitative platelet defects: Secondary | ICD-10-CM | POA: Diagnosis not present

## 2021-05-02 DIAGNOSIS — K219 Gastro-esophageal reflux disease without esophagitis: Secondary | ICD-10-CM | POA: Diagnosis not present

## 2021-05-02 DIAGNOSIS — L659 Nonscarring hair loss, unspecified: Secondary | ICD-10-CM

## 2021-05-02 DIAGNOSIS — J4520 Mild intermittent asthma, uncomplicated: Secondary | ICD-10-CM | POA: Diagnosis not present

## 2021-05-02 NOTE — Patient Instructions (Signed)
It was very nice to see you today! ? ?Research shingrix and tetanus   due for both.  ? ? ?PLEASE NOTE: ? ?If you had any lab tests please let us know if you have not heard back within a few days. You may see your results on MyChart before we have a chance to review them but we will give you a call once they are reviewed by [place]. If we ordered any referrals today, please let us know if you have not heard from their office within the next week.  ? ?Please try these tips to maintain a healthy lifestyle: ? ?Eat most of your calories during the day when you are active. Eliminate processed foods including packaged sweets (pies, cakes, cookies), reduce intake of potatoes, white bread, white pasta, and white rice. Look for whole grain options, oat flour or almond flour. ? ?Each meal should contain half fruits/vegetables, one quarter protein, and one quarter carbs (no bigger than a computer mouse). ? ?Cut down on sweet beverages. This includes juice, soda, and sweet tea. Also watch fruit intake, though this is a healthier sweet option, it still contains natural sugar! Limit to 3 servings daily. ? ?Drink at least 1 glass of water with each meal and aim for at least 8 glasses per day ? ?Exercise at least 150 minutes every week.   ?

## 2021-05-02 NOTE — Progress Notes (Signed)
? ?Subjective:  ? ? ? Patient ID: Sandra Freeman, female    DOB: 08/29/1960, 61 y.o.   MRN: [id number] ? ?Chief Complaint  ?Patient presents with  ? Transitions Of Care  ? ? ?HPI ? TOC.  Has a lot going on.  Was told needs to come in for TOC-has already seen me.  My note said cpx-pt "I don't have time for that today" ?Hair loss has stabilized-new products, prenatal vits, D helping.  A lot of stress.  ?PTSD-stopped meds.   Helping take care of bro-not good health.  Hair doing better since off(but has been on for sev yrs and no hair loss).  Still w/"spells" of crazy dreams-managing so far on own.  ?GERD-doing well w/meds.  No dysphagia.  ?Occ cough w/allergies/walking-rarely using albuterol ?6.   Abn plts-will return for labs ? ?Health Maintenance Due  ?Topic Date Due  ? TETANUS/TDAP  Never done  ? PAP SMEAR-Modifier  01/21/2001  ? Zoster Vaccines- Shingrix (1 of 2) Never done  ? COLONOSCOPY (Pts 45-7yr Insurance coverage will need to be confirmed)  06/09/2017  ? MAMMOGRAM  08/28/2017  ? COVID-19 Vaccine (2 - Pfizer series) 09/29/2019  ? ? ?Past Medical History:  ?Diagnosis Date  ? ALLERGIC RHINITIS 02/06/2007  ? ANEMIA, IRON DEFICIENCY 06/17/2007  ? ANXIETY DISORDER 06/02/2007  ? ASCUS PAP 04/23/2007  ? ASTHMA 02/06/2007  ? Asthma   ? DEPRESSION 02/06/2007  ? DIZZINESS 02/13/2007  ? Essential hypertension, benign 04/16/2007  ? FATTY LIVER DISEASE 06/17/2007  ? Gastroparesis 10/21/2008  ? GERD 02/06/2007  ? HYPERTENSION 03/18/2008  ? HYPOTHYROIDISM 02/06/2007  ? LIVER FUNCTION TESTS, ABNORMAL 02/10/2007  ? NONSPEC ELEVATION OF LEVELS OF TRANSAMINASE/LDH 06/02/2007  ? OBESITY 02/06/2007  ? 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 WRIST SPRAIN, RIGHT 05/23/2008  ? ? ?Past Surgical History:  ?Procedure Laterality Date  ? ANTERIOR FUSION CERVICAL SPINE    ? CATARACT EXTRACTION    ? KNEE ARTHROSCOPY    ? left  ? KNEE SURGERY    ? POSTERIOR CERVICAL LAMINECTOMY    ? TUBAL LIGATION    ? ? ?Outpatient Medications Prior to Visit  ?Medication Sig Dispense  Refill  ? albuterol (PROVENTIL) (2.5 MG/3ML) 0.083% nebulizer solution Take 3 mLs (2.5 mg total) by nebulization every 6 (six) hours as needed for wheezing or shortness of breath. 150 mL 1  ? albuterol (VENTOLIN HFA) 108 (90 Base) MCG/ACT inhaler Inhale 2 puffs into the lungs every 4 (four) hours as needed for wheezing or shortness of breath. 6.7 g 4  ? ALPRAZolam (XANAX) 0.5 MG tablet Take 0.5-1 tablets (0.25-0.5 mg total) by mouth 2 (two) times daily as needed for anxiety. 30 tablet 1  ? benzonatate (TESSALON) 100 MG capsule Take 2 capsules (200 mg total) by mouth 3 (three) times daily as needed for cough. 20 capsule 1  ? clotrimazole-betamethasone (LOTRISONE) cream Apply 1 application topically 2 (two) times daily. 30 g 3  ? fluticasone (FLONASE) 50 MCG/ACT nasal spray Place 1 spray into both nostrils daily. 16 g 2  ? pantoprazole (PROTONIX) 40 MG tablet TAKE 1 TABLET BY MOUTH ONCE DAILY. CARE  NEEDED  TO  BE  ESTABLISHED  WITH  NEW  PCP  FOR  FURTHER  REFILLS 30 tablet 0  ? chlorpheniramine-HYDROcodone (TUSSIONEX PENNKINETIC ER) 10-8 MG/5ML Take 5 mLs by mouth every 12 (twelve) hours as needed for cough. 115 mL 0  ? diclofenac sodium (VOLTAREN) 1 % GEL Apply 3 gm to 3 large joints up  to 3 times a day.Dispense 3 tubes with 3 refills. 3 Tube 1  ? venlafaxine XR (EFFEXOR-XR) 150 MG 24 hr capsule TAKE 2 CAPSULES BY MOUTH ONCE DAILY WITH BREAKFAST 180 capsule 0  ? ?No facility-administered medications prior to visit.  ? ? ?Allergies  ?Allergen Reactions  ? Egg White (Egg Protein) Rash  ?  Rash, swelling  ? Egg [Eggs Or Egg-Derived Products] Other (See Comments)  ?  Rash, swelling  ? Doxycycline   ? Hydroxyzine Other (See Comments)  ?  Extreme sedation  ? Seroquel [Quetiapine] Other (See Comments)  ?  dizzy  ? ?ROS neg/noncontributory except as noted HPI/below ?Abn smell issues since covid(bad smells). 1 yr ago.  ? ?   ?Objective:  ?  ? ?BP 100/60   Pulse (!) 54   Temp 98.1 ?F (36.7 ?C)   Ht 5' (1.524 m)   Wt 183  lb 3.2 oz (83.1 kg)   LMP 01/04/2010 (LMP Unknown)   SpO2 100%   BMI 35.78 kg/m?  ?Wt Readings from Last 3 Encounters:  ?05/02/21 183 lb 3.2 oz (83.1 kg)  ?03/16/21 185 lb 2 oz (84 kg)  ?01/10/21 185 lb (83.9 kg)  ? ? ?Physical Exam  ? ?Gen: [facility] NAD OF ?HEENT: NCAT, conjunctiva not injected, sclera nonicteric ?CARDIAC: RRR, S1S2+, no murmur. DP 2+B ?ABDOMEN:  BS+, soft, mildly diffusely tender, No HSM, no masses ?EXT:  no edema ?MSK: no gross abnormalities.  ?NEURO: A&O x3.  CN II-XII intact.  ?PSYCH: normal mood. Good eye contact ? ?   ?Assessment & Plan:  ? ?Problem List Items Addressed This Visit   ? ?  ? Respiratory  ? Asthma  ?  ? Digestive  ? GERD  ?  ? Other  ? PTSD (post-traumatic stress disorder)  ? ?Other Visit Diagnoses   ? ? Abnormal platelets (HCC)    -  Primary  ? Relevant Orders  ? CBC with Differential/Platelet  ? Hair loss      ? ?  ? Hair loss-stabilized on PNV and shampoo changes ?PTSD-a lot of stressors.  Pt self stopped venlafaxine-doing ok so far.  monitor ?GERD-stable on meds.  Continue ?Asthma/cough-pt doing well on prn albuterol. Cont ?Abn plts-has nash so may be spleen sequestration-will reck ? ?Declines RHM for now-:" just no time" ? ?No orders of the defined types were placed in this encounter. ? ? ?Wellington Hampshire, MD ? ?

## 2021-05-03 ENCOUNTER — Other Ambulatory Visit: Payer: BC Managed Care – PPO

## 2021-05-08 ENCOUNTER — Other Ambulatory Visit: Payer: Self-pay | Admitting: *Deleted

## 2021-05-08 ENCOUNTER — Other Ambulatory Visit (INDEPENDENT_AMBULATORY_CARE_PROVIDER_SITE_OTHER): Payer: BC Managed Care – PPO

## 2021-05-08 DIAGNOSIS — H16223 Keratoconjunctivitis sicca, not specified as Sjogren's, bilateral: Secondary | ICD-10-CM | POA: Diagnosis not present

## 2021-05-08 DIAGNOSIS — D691 Qualitative platelet defects: Secondary | ICD-10-CM

## 2021-05-08 DIAGNOSIS — Z0100 Encounter for examination of eyes and vision without abnormal findings: Secondary | ICD-10-CM | POA: Diagnosis not present

## 2021-05-08 LAB — CBC WITH DIFFERENTIAL/PLATELET
Basophils Absolute: 0 10*3/uL (ref 0.0–0.1)
Basophils Relative: 0.5 % (ref 0.0–3.0)
Eosinophils Absolute: 0 10*3/uL (ref 0.0–0.7)
Eosinophils Relative: 0.1 % (ref 0.0–5.0)
HCT: 40.2 % (ref 36.0–46.0)
Hemoglobin: 13.8 g/dL (ref 12.0–15.0)
Lymphocytes Relative: 37.1 % (ref 12.0–46.0)
Lymphs Abs: 1.7 10*3/uL (ref 0.7–4.0)
MCHC: 34.4 g/dL (ref 30.0–36.0)
MCV: 91 fl (ref 78.0–100.0)
Monocytes Absolute: 0.3 10*3/uL (ref 0.1–1.0)
Monocytes Relative: 6.1 % (ref 3.0–12.0)
Neutro Abs: 2.6 10*3/uL (ref 1.4–7.7)
Neutrophils Relative %: 56.2 % (ref 43.0–77.0)
Platelets: 116 10*3/uL — ABNORMAL LOW (ref 150.0–400.0)
RBC: 4.42 Mil/uL (ref 3.87–5.11)
RDW: 13.5 % (ref 11.5–15.5)
WBC: 4.6 10*3/uL (ref 4.0–10.5)

## 2021-05-09 ENCOUNTER — Telehealth: Payer: Self-pay | Admitting: Hematology

## 2021-05-09 NOTE — Telephone Encounter (Signed)
Scheduled appt per 4/18 referral. Pt is aware of appt date and time. Pt is aware to arrive 15 mins prior to appt time and to bring and updated insurance card. Pt is aware of appt location.   ?

## 2021-05-15 ENCOUNTER — Other Ambulatory Visit: Payer: Self-pay | Admitting: Family Medicine

## 2021-05-15 DIAGNOSIS — Z1231 Encounter for screening mammogram for malignant neoplasm of breast: Secondary | ICD-10-CM

## 2021-05-18 ENCOUNTER — Ambulatory Visit
Admission: RE | Admit: 2021-05-18 | Discharge: 2021-05-18 | Disposition: A | Discharge status: Home / Self Care | Payer: BC Managed Care – PPO | Source: Ambulatory Visit | Attending: Family Medicine | Admitting: Family Medicine

## 2021-05-18 DIAGNOSIS — Z1231 Encounter for screening mammogram for malignant neoplasm of breast: Secondary | ICD-10-CM | POA: Diagnosis not present

## 2021-05-24 ENCOUNTER — Other Ambulatory Visit: Payer: Self-pay

## 2021-05-24 ENCOUNTER — Inpatient Hospital Stay: Payer: BC Managed Care – PPO

## 2021-05-24 ENCOUNTER — Inpatient Hospital Stay: Payer: BC Managed Care – PPO | Attending: Hematology | Admitting: Hematology

## 2021-05-24 VITALS — BP 140/62 | HR 56 | Temp 97.7°F | Resp 20 | Wt 183.2 lb

## 2021-05-24 DIAGNOSIS — K760 Fatty (change of) liver, not elsewhere classified: Secondary | ICD-10-CM | POA: Insufficient documentation

## 2021-05-24 DIAGNOSIS — J45909 Unspecified asthma, uncomplicated: Secondary | ICD-10-CM | POA: Diagnosis not present

## 2021-05-24 DIAGNOSIS — Z803 Family history of malignant neoplasm of breast: Secondary | ICD-10-CM

## 2021-05-24 DIAGNOSIS — Z801 Family history of malignant neoplasm of trachea, bronchus and lung: Secondary | ICD-10-CM | POA: Diagnosis not present

## 2021-05-24 DIAGNOSIS — D696 Thrombocytopenia, unspecified: Secondary | ICD-10-CM | POA: Diagnosis not present

## 2021-05-24 DIAGNOSIS — Z79899 Other long term (current) drug therapy: Secondary | ICD-10-CM | POA: Insufficient documentation

## 2021-05-24 DIAGNOSIS — R431 Parosmia: Secondary | ICD-10-CM | POA: Insufficient documentation

## 2021-05-24 DIAGNOSIS — Z8616 Personal history of COVID-19: Secondary | ICD-10-CM | POA: Insufficient documentation

## 2021-05-24 DIAGNOSIS — D693 Immune thrombocytopenic purpura: Secondary | ICD-10-CM | POA: Diagnosis not present

## 2021-05-24 LAB — CMP (CANCER CENTER ONLY)
ALT: 18 U/L (ref 0–44)
AST: 23 U/L (ref 15–41)
Albumin: 4.1 g/dL (ref 3.5–5.0)
Alkaline Phosphatase: 81 U/L (ref 38–126)
Anion gap: 7 (ref 5–15)
BUN: 11 mg/dL (ref 8–23)
CO2: 28 mmol/L (ref 22–32)
Calcium: 9.5 mg/dL (ref 8.9–10.3)
Chloride: 107 mmol/L (ref 98–111)
Creatinine: 0.76 mg/dL (ref 0.44–1.00)
GFR, Estimated: >60 mL/min (ref >60)
Glucose, Bld: 95 mg/dL (ref 70–99)
Potassium: 3.9 mmol/L (ref 3.5–5.1)
Sodium: 142 mmol/L (ref 135–145)
Total Bilirubin: 1.2 mg/dL (ref 0.3–1.2)
Total Protein: 6.9 g/dL (ref 6.5–8.1)

## 2021-05-24 LAB — LACTATE DEHYDROGENASE: LDH: 150 U/L (ref 98–192)

## 2021-05-24 LAB — CBC WITH DIFFERENTIAL/PLATELET
Abs Immature Granulocytes: 0.02 10*3/uL (ref 0.00–0.07)
Basophils Absolute: 0 10*3/uL (ref 0.0–0.1)
Basophils Relative: 1 %
Eosinophils Absolute: 0 10*3/uL (ref 0.0–0.5)
Eosinophils Relative: 0 %
HCT: 40.9 % (ref 36.0–46.0)
Hemoglobin: 14.2 g/dL (ref 12.0–15.0)
Immature Granulocytes: 0 %
Lymphocytes Relative: 34 %
Lymphs Abs: 1.8 10*3/uL (ref 0.7–4.0)
MCH: 31.8 pg (ref 26.0–34.0)
MCHC: 34.7 g/dL (ref 30.0–36.0)
MCV: 91.5 fL (ref 80.0–100.0)
Monocytes Absolute: 0.3 10*3/uL (ref 0.1–1.0)
Monocytes Relative: 6 %
Neutro Abs: 3.2 10*3/uL (ref 1.7–7.7)
Neutrophils Relative %: 59 %
Platelets: 122 10*3/uL — ABNORMAL LOW (ref 150–400)
RBC: 4.47 MIL/uL (ref 3.87–5.11)
RDW: 12.8 % (ref 11.5–15.5)
Smear Review: NORMAL
WBC: 5.4 10*3/uL (ref 4.0–10.5)
nRBC: 0 % (ref 0.0–0.2)

## 2021-05-24 LAB — VITAMIN B12: Vitamin B-12: 281 pg/mL (ref 180–914)

## 2021-05-24 LAB — IMMATURE PLATELET FRACTION: Immature Platelet Fraction: 3.7 % (ref 1.2–8.6)

## 2021-05-24 LAB — HEPATITIS C ANTIBODY: HCV Ab: NONREACTIVE
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
## 2021-05-25 LAB — ANTINUCLEAR ANTIBODIES, IFA: ANA Ab, IFA: NEGATIVE

## 2021-05-25 LAB — FOLATE RBC
Folate, Hemolysate: 435 ng/mL
Folate, RBC: 1028 ng/mL (ref >498)
Hematocrit: 42.3 % (ref 34.0–46.6)

## 2021-05-26 ENCOUNTER — Encounter: Payer: Self-pay | Admitting: Hematology

## 2021-05-30 ENCOUNTER — Other Ambulatory Visit: Payer: Self-pay | Admitting: Family Medicine

## 2021-06-01 ENCOUNTER — Inpatient Hospital Stay (HOSPITAL_BASED_OUTPATIENT_CLINIC_OR_DEPARTMENT_OTHER): Payer: BC Managed Care – PPO | Admitting: Hematology

## 2021-06-01 DIAGNOSIS — D696 Thrombocytopenia, unspecified: Secondary | ICD-10-CM | POA: Diagnosis not present
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
## 2021-06-12 ENCOUNTER — Encounter: Payer: Self-pay | Admitting: Family Medicine

## 2021-07-18 ENCOUNTER — Other Ambulatory Visit: Payer: Self-pay | Admitting: Family Medicine

## 2021-08-17 ENCOUNTER — Other Ambulatory Visit: Payer: Self-pay | Admitting: Family Medicine

## 2021-10-15 ENCOUNTER — Encounter: Payer: Self-pay | Admitting: *Deleted

## 2022-01-03 ENCOUNTER — Encounter: Payer: Self-pay | Admitting: *Deleted

## 2022-01-16 ENCOUNTER — Telehealth: Payer: Self-pay | Admitting: Family Medicine

## 2022-01-16 ENCOUNTER — Encounter: Payer: Self-pay | Admitting: Family Medicine

## 2022-01-16 NOTE — Telephone Encounter (Signed)
Patient sent message through [facility].

## 2022-01-16 NOTE — Telephone Encounter (Signed)
Patient states: - On christmas she started experiencing a cough and congestion - Had a fever of 102 on 01/14/22 but this broke that same night. On 01/15/22 temp went down to 100.2. Pt took tylenol. No fever today.    Patient requests: -PCP be notified of situation just in case she wanted her to be seen sooner   Patient was scheduled to see Alyssa Allwardt on 01/17/22 @ 8:30am.

## 2022-01-16 NOTE — Telephone Encounter (Signed)
Addition to previous note:   -Patient has tested negative for COVID on 01/15/22 and 01/16/22.

## 2022-01-17 ENCOUNTER — Ambulatory Visit: Payer: BC Managed Care – PPO | Admitting: Physician Assistant

## 2022-01-17 ENCOUNTER — Encounter: Payer: Self-pay | Admitting: Physician Assistant

## 2022-01-17 ENCOUNTER — Telehealth: Payer: Self-pay | Admitting: Family Medicine

## 2022-01-17 VITALS — BP 120/76 | HR 77 | Temp 98.2°F | Ht 60.0 in | Wt 184.6 lb

## 2022-01-17 DIAGNOSIS — R051 Acute cough: Secondary | ICD-10-CM

## 2022-01-17 DIAGNOSIS — R509 Fever, unspecified: Secondary | ICD-10-CM | POA: Diagnosis not present

## 2022-01-17 LAB — POCT INFLUENZA A/B
Influenza A, POC: NEGATIVE
Influenza B, POC: NEGATIVE

## 2022-01-17 MED ORDER — AMOXICILLIN-POT CLAVULANATE 875-125 MG PO TABS: 1.0000 | ORAL_TABLET | Freq: Two times a day (BID) | ORAL | 0 refills | Status: DC

## 2022-01-17 MED ORDER — GUAIFENESIN-CODEINE 100-10 MG/5ML PO SYRP: 5.0000 mL | ORAL_SOLUTION | Freq: Three times a day (TID) | ORAL | 0 refills | Status: DC | PRN

## 2022-01-17 MED ORDER — PREDNISONE 20 MG PO TABS: 20.0000 mg | ORAL_TABLET | Freq: Two times a day (BID) | ORAL | 0 refills | Status: AC

## 2022-01-17 NOTE — Progress Notes (Signed)
Subjective:    Patient ID: Sandra Freeman, female    DOB: 1960-11-17, 61 y.o.   MRN: [id number]  Chief Complaint  Patient presents with   Cough    Pt took 2 covid test in a row and both neg. Pt states its bronco stuff you treated her for in the past.   Nasal Congestion    chill   Chills    HPI Patient with history of asthma is in today for the following:  Chief complaint: Flu-like symptoms Symptom onset: 01/14/22 Pertinent positives: Fever (102 F Tmax); chills, body aches, productive cough yellow sputum, congestion, wheezing, ST from coughing  Pertinent negatives: N/V/D, abdominal pain Treatments tried: Mucinex; codeine cough syrup (just finished from previous bottle this week); Vaccine status: doesn't take flu vaccine Sick exposure: None that patient is aware of   COVID-19 testing at home has been negative, last test yesterday.    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   WRIST SPRAIN, RIGHT 05/23/2008    Past Surgical History:  Procedure Laterality Date   ANTERIOR FUSION CERVICAL SPINE     CATARACT EXTRACTION     KNEE ARTHROSCOPY     left   KNEE SURGERY     POSTERIOR CERVICAL LAMINECTOMY     TUBAL LIGATION      Family History  Problem Relation Age of Onset   Breast cancer Mother    Cancer Mother        breast   COPD Mother    Diabetes Father    Cancer Father        lung   Heart disease Father    Alcohol abuse Father    Alcohol abuse Maternal  Grandmother    Cirrhosis Maternal Grandmother    Heart disease Maternal Grandfather    Alcohol abuse Maternal Grandfather    Diabetes Paternal Grandmother    Heart disease Paternal Grandfather    Depression Cousin    Diabetes Half-Brother    Heart disease Half-Brother    Diabetes Half-Brother     Social History   Tobacco Use   Smoking status: Never   Smokeless tobacco: Never  Vaping Use   Vaping Use: Never used  Substance Use Topics   Alcohol use: Yes    Alcohol/week: 1.0 standard drink of alcohol    Types: 1 Cans of beer per week    Comment: rare   Drug use: Never     Allergies  Allergen Reactions   Egg White (Egg Protein) Rash    Rash, swelling   Egg [Eggs Or Egg-Derived Products] Other (See Comments)    Rash, swelling   Doxycycline    Hydroxyzine Other (See Comments)    Extreme sedation   Seroquel [Quetiapine] Other (See Comments)    dizzy    Review of Systems NEGATIVE UNLESS OTHERWISE INDICATED IN HPI      Objective:     BP 120/76 (BP Location: Left Arm, Patient Position: Sitting)   Pulse 77  Temp 98.2 F (36.8 C) (Temporal)   Ht 5' (1.524 m)   Wt 184 lb 9.6 oz (83.7 kg)   LMP 01/04/2010 (LMP Unknown)   SpO2 99%   BMI 36.05 kg/m   Wt Readings from Last 3 Encounters:  01/17/22 184 lb 9.6 oz (83.7 kg)  05/24/21 183 lb 3.2 oz (83.1 kg)  05/02/21 183 lb 3.2 oz (83.1 kg)    BP Readings from Last 3 Encounters:  01/17/22 120/76  05/24/21 140/62  05/02/21 100/60     Physical Exam Vitals and nursing note reviewed.  Constitutional:      General: She is not in acute distress.    Appearance: Normal appearance. She is ill-appearing.  HENT:     Head: Normocephalic.     Right Ear: Tympanic membrane, ear canal and external ear normal.     Left Ear: Tympanic membrane, ear canal and external ear normal.     Nose: Congestion present.     Mouth/Throat:     Mouth: Mucous membranes are moist.     Pharynx: No oropharyngeal exudate or posterior  oropharyngeal erythema.  Eyes:     Extraocular Movements: Extraocular movements intact.     Conjunctiva/sclera: Conjunctivae normal.     Pupils: Pupils are equal, round, and reactive to light.  Cardiovascular:     Rate and Rhythm: Normal rate and regular rhythm.     Pulses: Normal pulses.     Heart sounds: Normal heart sounds. No murmur heard. Pulmonary:     Effort: Pulmonary effort is normal. No respiratory distress.     Breath sounds: Normal breath sounds. No wheezing or rhonchi.     Comments: Productive hacking cough Musculoskeletal:     Cervical back: Normal range of motion.  Skin:    General: Skin is warm.  Neurological:     Mental Status: She is alert and oriented to person, place, and time.  Psychiatric:        Mood and Affect: Mood normal.        Behavior: Behavior normal.        Assessment & Plan:  Fever, unspecified fever cause -     POCT Influenza A/B  Acute cough -     POCT Influenza A/B  Other orders -     guaiFENesin-Codeine; Take 5 mLs by mouth 3 (three) times daily as needed for cough.  Dispense: 120 mL; Refill: 0 -     predniSONE; Take 1 tablet (20 mg total) by mouth 2 (two) times daily with a meal for 5 days.  Dispense: 10 tablet; Refill: 0 -     Amoxicillin-Pot Clavulanate; Take 1 tablet by mouth 2 (two) times daily.  Dispense: 20 tablet; Refill: 0   Patient appears flulike on first impression.  Performed point-of-care flu test in office today, which was negative.  Did not repeat COVID-19 testing as she had just done this yesterday and it was negative.  Patient has a history of asthma and significant lung infections when she has any type of upper respiratory infection.  We gave patient prescription for Augmentin, prednisone, codeine cough syrup to take as directed, as she is adamant she will need this to clear of infection.  Red flags discussed with patient.  She will monitor her oxygen at home.  She will present to the urgent care or emergency department  this weekend if needed.  She will follow-up with her PCP as needed.     Return if symptoms worsen or fail to improve.  This  note was prepared with assistance of Systems analyst. Occasional wrong-word or sound-a-like substitutions may have occurred due to the inherent limitations of voice recognition software.    Caedan Sumler M Bernie Ransford, PA-C

## 2022-01-18 NOTE — Telephone Encounter (Signed)
ERROR

## 2022-02-19 ENCOUNTER — Other Ambulatory Visit: Payer: Self-pay | Admitting: Family Medicine

## 2022-04-17 ENCOUNTER — Other Ambulatory Visit: Payer: Self-pay | Admitting: Family Medicine

## 2022-04-17 DIAGNOSIS — Z1231 Encounter for screening mammogram for malignant neoplasm of breast: Secondary | ICD-10-CM

## 2022-05-31 ENCOUNTER — Ambulatory Visit
Admission: RE | Admit: 2022-05-31 | Discharge: 2022-05-31 | Disposition: A | Discharge status: Home / Self Care | Payer: Commercial Managed Care - PPO | Source: Ambulatory Visit | Attending: Family Medicine | Admitting: Family Medicine

## 2022-05-31 DIAGNOSIS — Z1231 Encounter for screening mammogram for malignant neoplasm of breast: Secondary | ICD-10-CM

## 2022-08-02 ENCOUNTER — Ambulatory Visit: Payer: Commercial Managed Care - PPO | Admitting: Family Medicine

## 2022-08-02 ENCOUNTER — Encounter: Payer: Self-pay | Admitting: Family Medicine

## 2022-08-02 VITALS — BP 138/76 | HR 59 | Temp 98.2°F | Ht 60.0 in | Wt 196.0 lb

## 2022-08-02 DIAGNOSIS — M545 Low back pain, unspecified: Secondary | ICD-10-CM | POA: Diagnosis not present

## 2022-08-02 MED ORDER — CYCLOBENZAPRINE HCL 10 MG PO TABS: 10.0000 mg | ORAL_TABLET | Freq: Every evening | ORAL | 0 refills | Status: DC | PRN

## 2022-08-02 MED ORDER — DICLOFENAC SODIUM 75 MG PO TBEC: 75.0000 mg | DELAYED_RELEASE_TABLET | Freq: Two times a day (BID) | ORAL | 0 refills | Status: DC | PRN
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
## 2022-08-02 NOTE — Patient Instructions (Signed)
Please follow up if symptoms do not improve or as needed.    Back Exercises The following exercises strengthen the muscles that help to support the trunk (torso) and back. They also help to keep the lower back flexible. Doing these exercises can help to prevent or lessen existing low back pain. If you have back pain or discomfort, try doing these exercises 2-3 times each day or as told by your health care provider. As your pain improves, do them once each day, but increase the number of times that you repeat the steps for each exercise (do more repetitions). To prevent the recurrence of back pain, continue to do these exercises once each day or as told by your health care provider. Do exercises exactly as told by your health care provider and adjust them as directed. It is normal to feel mild stretching, pulling, tightness, or discomfort as you do these exercises, but you should stop right away if you feel sudden pain or your pain gets worse. Exercises Single knee to chest Repeat these steps 3-5 times for each leg: Lie on your back on a firm bed or the floor with your legs extended.

## 2022-08-15 ENCOUNTER — Encounter: Payer: Self-pay | Admitting: Family Medicine

## 2022-08-15 ENCOUNTER — Other Ambulatory Visit: Payer: Self-pay | Admitting: Family Medicine

## 2022-08-15 MED ORDER — GABAPENTIN 100 MG PO CAPS: 100.0000 mg | ORAL_CAPSULE | Freq: Two times a day (BID) | ORAL | 0 refills | Status: DC

## 2022-08-15 MED ORDER — PREDNISONE 20 MG PO TABS: 40.0000 mg | ORAL_TABLET | Freq: Every day | ORAL | 0 refills | Status: AC

## 2022-08-23 ENCOUNTER — Encounter: Payer: Self-pay | Admitting: Family Medicine

## 2022-08-26 ENCOUNTER — Ambulatory Visit: Payer: Commercial Managed Care - PPO | Admitting: Physician Assistant

## 2022-08-26 ENCOUNTER — Encounter: Payer: Self-pay | Admitting: Physician Assistant

## 2022-08-26 VITALS — BP 136/70 | HR 56 | Temp 98.0°F | Ht 60.0 in | Wt 193.0 lb

## 2022-08-26 DIAGNOSIS — R1013 Epigastric pain: Secondary | ICD-10-CM

## 2022-08-26 MED ORDER — PANTOPRAZOLE SODIUM 40 MG PO TBEC: 40.0000 mg | DELAYED_RELEASE_TABLET | Freq: Two times a day (BID) | ORAL | 0 refills | Status: DC

## 2022-08-26 NOTE — Progress Notes (Signed)
Sandra Freeman is a 62 y.o. female here for a new problem.  History of Present Illness:   Chief Complaint  Patient presents with   Bloated    Pt c/o excessive bloating x 2-3 months, has worsened.    HPI  Excessive Bloating: Complains of excessive bloating and abdominal tenderness after consumption that worsened 2-3 months ago. Also endorses episodes of tightening pressure in stomach, but has not affected bathroom habits.  Reports that bloating and upper abdominal tenderness has always occurred, but not as severe.  Notes she does not experience any symptoms after waking up until she consumes something either eating or drinking. Also reports no major change in diet, but she has added in psyllium husk, occasional probiotics, and for the past two days half a dose of Miralax.   Compliant with 40 mg Pantoprazole, has also used Nexium and Gas-X with temporary minimal relief.  She wonders if it may be due to resistance to Pantoprazole since she's been taking it for years. Previously used Nexium and Prilosec. Also wonders if her mid-low back pain may be exacerbating symptoms.  Denies regular use of Tylenol or Ibuprofen, changes to stools.  Overdue 5 years for colonoscopy.  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   WRIST SPRAIN, RIGHT 05/23/2008     Social History   Tobacco Use   Smoking status: Never   Smokeless tobacco: Never  Vaping Use   Vaping status: Never Used   Substance Use Topics   Alcohol use: Yes    Alcohol/week: 1.0 standard drink of alcohol    Types: 1 Cans of beer per week    Comment: rare   Drug use: Never    Past Surgical History:  Procedure Laterality Date   ANTERIOR FUSION CERVICAL SPINE     CATARACT EXTRACTION     KNEE ARTHROSCOPY     left   KNEE SURGERY     POSTERIOR CERVICAL LAMINECTOMY     TUBAL LIGATION      Family History  Problem Relation Age of Onset   Breast cancer Mother    Cancer Mother        breast   COPD Mother    Diabetes Father    Cancer Father        lung   Heart disease Father    Alcohol abuse Father    Alcohol abuse Maternal Grandmother    Cirrhosis Maternal Grandmother    Heart disease Maternal Grandfather    Alcohol abuse Maternal Grandfather    Diabetes Paternal Grandmother    Heart disease Paternal Grandfather    Depression Cousin    Diabetes Half-Brother    Heart disease Half-Brother    Diabetes Half-Brother     Allergies  Allergen Reactions   Egg White (Egg Protein) Rash    Rash, swelling   Egg [Egg-Derived Products] Other (See Comments)    Rash, swelling   Doxycycline    Hydroxyzine Other (See Comments)    Extreme sedation  Seroquel [Quetiapine] Other (See Comments)    dizzy    Current Medications:   Current Outpatient Medications:    albuterol (PROVENTIL) (2.5 MG/3ML) 0.083% nebulizer solution, Take 3 mLs (2.5 mg total) by nebulization every 6 (six) hours as needed for wheezing or shortness of breath., Disp: 150 mL, Rfl: 1   albuterol (VENTOLIN HFA) 108 (90 Base) MCG/ACT inhaler, Inhale 2 puffs into the lungs every 4 (four) hours as needed for wheezing or shortness of breath., Disp: 6.7 g, Rfl: 4   ALPRAZolam (XANAX) 0.5 MG tablet, Take 0.5-1 tablets (0.25-0.5 mg total) by mouth 2 (two) times daily as needed for anxiety., Disp: 30 tablet, Rfl: 1   benzonatate (TESSALON) 100 MG capsule, TAKE 2 CAPSULES BY MOUTH THREE TIMES DAILY AS NEEDED FOR COUGH, Disp: 20 capsule, Rfl:  0   clotrimazole-betamethasone (LOTRISONE) cream, Apply 1 application topically 2 (two) times daily., Disp: 30 g, Rfl: 3   cyclobenzaprine (FLEXERIL) 10 MG tablet, Take 1 tablet (10 mg total) by mouth at bedtime as needed for muscle spasms., Disp: 30 tablet, Rfl: 0   diclofenac (VOLTAREN) 75 MG EC tablet, Take 1 tablet (75 mg total) by mouth 2 (two) times daily as needed., Disp: 30 tablet, Rfl: 0   fluticasone (FLONASE) 50 MCG/ACT nasal spray, Place 1 spray into both nostrils daily., Disp: 16 g, Rfl: 2   gabapentin (NEURONTIN) 100 MG capsule, Take 1 capsule (100 mg total) by mouth 2 (two) times daily., Disp: 60 capsule, Rfl: 0   guaiFENesin-codeine (ROBITUSSIN AC) 100-10 MG/5ML syrup, Take 5 mLs by mouth 3 (three) times daily as needed for cough., Disp: 120 mL, Rfl: 0   pantoprazole (PROTONIX) 40 MG tablet, Take 1 tablet (40 mg total) by mouth daily., Disp: 90 tablet, Rfl: 1   Review of Systems:   Review of Systems  Gastrointestinal:  Positive for abdominal pain (upper abdomen).       (+) Excessive bloating (after consumption) (+) Pressure (stomach)    Vitals:   Vitals:   08/26/22 1049  BP: 136/70  Pulse: (!) 56  Temp: 98 F (36.7 C)  TempSrc: Temporal  SpO2: 97%  Weight: 193 lb (87.5 kg)  Height: 5' (1.524 m)     Body mass index is 37.69 kg/m.  Physical Exam:   Physical Exam Vitals and nursing note reviewed.  Constitutional:      General: She is not in acute distress.    Appearance: She is well-developed. She is not ill-appearing or toxic-appearing.  Cardiovascular:     Rate and Rhythm: Normal rate and regular rhythm.     Pulses: Normal pulses.     Heart sounds: Normal heart sounds, S1 normal and S2 normal.  Pulmonary:     Effort: Pulmonary effort is normal.     Breath sounds: Normal breath sounds.  Abdominal:     General: Abdomen is flat. Bowel sounds are normal.     Palpations: Abdomen is soft.     Tenderness: There is abdominal tenderness in the epigastric area.  There is no right CVA tenderness, left CVA tenderness, guarding or rebound.  Skin:    General: Skin is warm and dry.  Neurological:     Mental Status: She is alert.     GCS: GCS eye subscore is 4. GCS verbal subscore is 5. GCS motor subscore is 6.  Psychiatric:        Speech: Speech normal.        Behavior: Behavior normal. Behavior is cooperative.  Assessment and Plan:   Epigastric pain Chronic issue for her with recent exacerbation No evidence of acute abdomen suggesting need for stat imaging DDx includes, but is not limited to -- gastritis, PEPTIC ULCER DISEASE, IBS, constipation among others Recommend:  -Increase protonix to 40 mg twice daily -Continue 1 capful/packet of miralax daily  -Continue this x 2 weeks and report back via MyChart -Next step would be gastroenterology referral for possible endoscopy and to update your colonoscopy  If any worsening, advised her to reach out to [place]  [employer] as a scribe for [employer], PA.,have documented all relevant documentation on the behalf of Jarold Motto, PA,as directed by  Jarold Motto, PA while in the presence of Jarold Motto, [place].  I, Jarold Motto, [place], have reviewed all documentation for this visit. The documentation on 08/26/22 for the exam, diagnosis, procedures, and orders are all accurate and complete.  Jarold Motto, PA-C

## 2022-08-26 NOTE — Patient Instructions (Signed)
It was great to see you!  Increase protonix to 40 mg twice daily  Continue 1 capful/packet of miralax daily   Continue this x 2 weeks and report back via MyChart  Next step would be gastroenterology referral for possible endoscopy and to update your colonoscopy  Take care,  Jarold Motto PA-C

## 2022-09-08 ENCOUNTER — Other Ambulatory Visit: Payer: Self-pay | Admitting: Family Medicine

## 2022-09-09 ENCOUNTER — Encounter: Payer: Self-pay | Admitting: Family Medicine

## 2022-09-09 NOTE — Telephone Encounter (Signed)
Spoke with patient, she said she was taking this for her back pain. Also, that she only took the muscle relaxer once and stopped due to them causing her to sleep too much. She said that this did help with her back pain and she just wasn't sure if you wanted her to continue taking this or not.

## 2022-09-17 ENCOUNTER — Ambulatory Visit: Payer: Commercial Managed Care - PPO | Admitting: Family Medicine

## 2022-09-24 ENCOUNTER — Other Ambulatory Visit: Payer: Self-pay | Admitting: Physician Assistant

## 2022-10-22 ENCOUNTER — Encounter: Payer: Self-pay | Admitting: Physician Assistant

## 2022-10-22 ENCOUNTER — Ambulatory Visit: Payer: Commercial Managed Care - PPO | Admitting: Physician Assistant

## 2022-10-22 VITALS — BP 136/70 | HR 52 | Temp 97.3°F | Ht 60.0 in | Wt 192.5 lb

## 2022-10-22 DIAGNOSIS — H6122 Impacted cerumen, left ear: Secondary | ICD-10-CM | POA: Diagnosis not present

## 2022-10-22 NOTE — Progress Notes (Signed)
Sandra Freeman is a 62 y.o. female here for a new problem.  History of Present Illness:   Chief Complaint  Patient presents with   Cerumen Impaction    Pt c/o not being able to hear out of left ear since 9/17.    HPI  Cerumen Impaction She complains today of difficulty hearing out form her left ear.  Her symptoms started around 9/17. She reports hearing better upon removal of ear wax. She has used an OTC wax removal kit that didn't relief her symptoms.  She states that she doesn't typically use any Q-tips She reports experiencing an episode of vertigo but denies any dizziness   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   WRIST SPRAIN, RIGHT 05/23/2008     Social History   Tobacco Use   Smoking status: Never   Smokeless tobacco: Never  Vaping Use   Vaping status: Never Used  Substance Use Topics   Alcohol use: Yes    Alcohol/week: 1.0 standard drink of alcohol    Types: 1 Cans of beer per week    Comment: rare   Drug use: Never    Past Surgical History:  Procedure Laterality Date   ANTERIOR FUSION CERVICAL SPINE     CATARACT EXTRACTION     KNEE ARTHROSCOPY     left   KNEE SURGERY     POSTERIOR CERVICAL LAMINECTOMY     TUBAL LIGATION      Family History  Problem Relation Age of Onset   Breast cancer Mother    Cancer Mother        breast   COPD Mother    Diabetes Father    Cancer Father        lung   Heart disease Father    Alcohol abuse Father     Alcohol abuse Maternal Grandmother    Cirrhosis Maternal Grandmother    Heart disease Maternal Grandfather    Alcohol abuse Maternal Grandfather    Diabetes Paternal Grandmother    Heart disease Paternal Grandfather    Depression Cousin    Diabetes Half-Brother    Heart disease Half-Brother    Diabetes Half-Brother     Allergies  Allergen Reactions   Egg White (Egg Protein) Rash    Rash, swelling   Egg [Egg-Derived Products] Other (See Comments)    Rash, swelling   Doxycycline    Hydroxyzine Other (See Comments)    Extreme sedation   Seroquel [Quetiapine] Other (See Comments)    dizzy    Current Medications:   Current Outpatient Medications:    albuterol (PROVENTIL) (2.5 MG/3ML) 0.083% nebulizer solution, Take 3 mLs (2.5 mg total) by nebulization every 6 (six) hours as needed for wheezing or shortness of breath., Disp: 150 mL, Rfl: 1   albuterol (VENTOLIN HFA) 108 (90 Base) MCG/ACT inhaler, Inhale 2 puffs into the lungs every 4 (four) hours as needed for wheezing or shortness of breath., Disp: 6.7 g, Rfl: 4   ALPRAZolam (  XANAX) 0.5 MG tablet, Take 0.5-1 tablets (0.25-0.5 mg total) by mouth 2 (two) times daily as needed for anxiety., Disp: 30 tablet, Rfl: 1   benzonatate (TESSALON) 100 MG capsule, TAKE 2 CAPSULES BY MOUTH THREE TIMES DAILY AS NEEDED FOR COUGH, Disp: 20 capsule, Rfl: 0   clotrimazole-betamethasone (LOTRISONE) cream, Apply 1 application topically 2 (two) times daily., Disp: 30 g, Rfl: 3   cyclobenzaprine (FLEXERIL) 10 MG tablet, Take 1 tablet (10 mg total) by mouth at bedtime as needed for muscle spasms., Disp: 30 tablet, Rfl: 0   diclofenac (VOLTAREN) 75 MG EC tablet, Take 1 tablet (75 mg total) by mouth 2 (two) times daily as needed., Disp: 30 tablet, Rfl: 0   fluticasone (FLONASE) 50 MCG/ACT nasal spray, Place 1 spray into both nostrils daily., Disp: 16 g, Rfl: 2   pantoprazole (PROTONIX) 40 MG tablet, TAKE 1 TABLET BY MOUTH TWICE DAILY BEFORE A MEAL, Disp: 60  tablet, Rfl: 0   Review of Systems:   Review of Systems  HENT:  Positive for hearing loss.   Negative unless otherwise specified per HPI.   Vitals:   Vitals:   10/22/22 1142  BP: 136/70  Pulse: (!) 52  Temp: (!) 97.3 F (36.3 C)  TempSrc: Temporal  SpO2: 98%  Weight: 192 lb 8 oz (87.3 kg)  Height: 5' (1.524 m)     Body mass index is 37.6 kg/m.  Physical Exam:   Physical Exam Constitutional:      Appearance: Normal appearance. She is well-developed.  HENT:     Head: Normocephalic and atraumatic.     Left Ear: External ear normal. There is impacted cerumen.  Eyes:     General: Lids are normal.     Extraocular Movements: Extraocular movements intact.     Conjunctiva/sclera: Conjunctivae normal.  Pulmonary:     Effort: Pulmonary effort is normal.  Musculoskeletal:        General: Normal range of motion.     Cervical back: Normal range of motion and neck supple.  Skin:    General: Skin is warm and dry.  Neurological:     Mental Status: She is alert and oriented to person, place, and time.  Psychiatric:        Attention and Perception: Attention and perception normal.        Mood and Affect: Mood normal.        Behavior: Behavior normal.        Thought Content: Thought content normal.        Judgment: Judgment normal.    Ceruminosis is noted.  Wax is removed by syringing and manual debridement.    Assessment and Plan:   Impacted cerumen of left ear No red flags Only had partial improvement of hearing and partial removal of wax -- could not tolerate further cleaning due to pain Recommend home management with dilute hydrogen peroxide and return if needed  Jarold Motto, PA-C  I,Safa M Kadhim,acting as a scribe for Jarold Motto, PA.,have documented all relevant documentation on the behalf of Jarold Motto, PA,as directed by  Jarold Motto, PA while in the presence of Jarold Motto, [place].   I, Jarold Motto, [place], have reviewed all documentation for  this visit. The documentation on 10/22/22 for the exam, diagnosis, procedures, and orders are all accurate and complete.

## 2022-10-22 NOTE — Patient Instructions (Signed)
May use cotton ball soaked in mineral oil into ear 10-20 min per week if having recurrent ear wax accumulation. May use dilute hydrogen peroxide (equal parts this and water) and place a few drops into ear every 2 weeks to help break down wax buildup.     

## 2022-12-31 ENCOUNTER — Encounter: Payer: Self-pay | Admitting: Family Medicine

## 2022-12-31 ENCOUNTER — Ambulatory Visit: Payer: Commercial Managed Care - PPO | Admitting: Family Medicine

## 2022-12-31 VITALS — BP 138/64 | HR 97 | Temp 99.3°F | Ht 60.0 in | Wt 195.0 lb

## 2022-12-31 DIAGNOSIS — R051 Acute cough: Secondary | ICD-10-CM | POA: Diagnosis not present

## 2022-12-31 DIAGNOSIS — J208 Acute bronchitis due to other specified organisms: Secondary | ICD-10-CM

## 2022-12-31 DIAGNOSIS — B9689 Other specified bacterial agents as the cause of diseases classified elsewhere: Secondary | ICD-10-CM

## 2022-12-31 DIAGNOSIS — J4521 Mild intermittent asthma with (acute) exacerbation: Secondary | ICD-10-CM

## 2022-12-31 LAB — POC COVID19 BINAXNOW: SARS Coronavirus 2 Ag: NEGATIVE

## 2022-12-31 MED ORDER — GUAIFENESIN-CODEINE 100-10 MG/5ML PO SOLN: 5.0000 mL | Freq: Four times a day (QID) | ORAL | 0 refills | Status: DC | PRN

## 2022-12-31 MED ORDER — AZITHROMYCIN 250 MG PO TABS: ORAL_TABLET | ORAL | 0 refills | Status: DC

## 2022-12-31 MED ORDER — PREDNISONE 20 MG PO TABS: ORAL_TABLET | ORAL | 0 refills | Status: DC

## 2022-12-31 MED ORDER — ALBUTEROL SULFATE HFA 108 (90 BASE) MCG/ACT IN AERS: 2.0000 | INHALATION_SPRAY | RESPIRATORY_TRACT | 4 refills | Status: AC | PRN

## 2022-12-31 NOTE — Progress Notes (Signed)
Subjective  CC:  Chief Complaint  Patient presents with   Cough    Pt stated that she had been having a sore throat and cough since Friday along with fever@100 .2.     HPI: SUBJECTIVE:  Sandra Freeman is a 62 y.o. female who reports history of asthma complains of chest congestion, nasal blockage, post nasal drip, cough described as harsh, painful, and productive and had associated low-grade fevers up to 100.2 without significant myalgias or chest pain or significant GI symptoms. Symptoms have been present for 3 to 4 days now.  She just ran out of her inhaler, she has been using albuterol nebulizer which has been helpful.  She denies a history of anorexia, dizziness, vomiting and wheezing.   Assessment  1. Acute bacterial bronchitis   2. Acute cough   3. Mild intermittent asthma with acute exacerbation      Plan  Discussion:  Treat for bacterial bronchitis due to prolonged course and worsening symptoms.  Recommend Z-Pak, albuterol, prednisone burst for asthma, and Robitussin AC.  She does have Norco on her list for pain management but rarely uses it.  She has been instructed not to use Norco with Robitussin AC.  She understands and agrees.  She will follow-up if breathing is not improving.  Follow up: As needed  Orders Placed This Encounter  Procedures   POC COVID-19   Meds ordered this encounter  Medications   azithromycin (ZITHROMAX) 250 MG tablet    Sig: Take 2 tabs today, then 1 tab daily for 4 days    Dispense:  1 each    Refill:  0   predniSONE (DELTASONE) 20 MG tablet    Sig: Take 3 tabs daily for 5 days    Dispense:  15 tablet    Refill:  0   guaiFENesin-codeine 100-10 MG/5ML syrup    Sig: Take 5 mLs by mouth every 6 (six) hours as needed for cough.    Dispense:  120 mL    Refill:  0   albuterol (VENTOLIN HFA) 108 (90 Base) MCG/ACT inhaler    Sig: Inhale 2 puffs into the lungs every 4 (four) hours as needed for wheezing or shortness of breath.    Dispense:  6.7  g    Refill:  4      I reviewed the patients updated PMH, FH, and SocHx.  Social History: Patient  reports that she has never smoked. She has never used smokeless tobacco. She reports current alcohol use of about 1.0 standard drink of alcohol per week. She reports that she does not use drugs.  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   ALPRAZolam (XANAX) 0.5 MG tablet Take 0.5-1  tablets (0.25-0.5 mg total) by mouth 2 (two) times daily as needed for anxiety.   azithromycin (ZITHROMAX) 250 MG tablet Take 2 tabs today, then 1 tab daily for 4 days   benzonatate (TESSALON) 100 MG capsule TAKE 2 CAPSULES BY MOUTH THREE TIMES DAILY AS NEEDED FOR COUGH   clotrimazole-betamethasone (LOTRISONE) cream Apply 1 application topically 2 (two) times daily.   cyclobenzaprine (FLEXERIL) 10 MG tablet Take 1 tablet (10 mg total) by mouth at bedtime as needed for muscle spasms.   diclofenac (VOLTAREN) 75 MG EC tablet Take 1 tablet (75 mg total) by mouth 2 (two) times daily as needed.   fluticasone (FLONASE) 50 MCG/ACT nasal spray Place 1 spray into both  nostrils daily.   guaiFENesin-codeine 100-10 MG/5ML syrup Take 5 mLs by mouth every 6 (six) hours as needed for cough.   pantoprazole (PROTONIX) 40 MG tablet TAKE 1 TABLET BY MOUTH TWICE DAILY BEFORE A MEAL   predniSONE (DELTASONE) 20 MG tablet Take 3 tabs daily for 5 days   [DISCONTINUED] albuterol (VENTOLIN HFA) 108 (90 Base) MCG/ACT inhaler Inhale 2 puffs into the lungs every 4 (four) hours as needed for wheezing or shortness of breath.    Objective  Vitals: BP 138/64   Pulse 97   Temp 99.3 F (37.4 C)   Ht 5' (1.524 m)   Wt 195 lb (88.5 kg)   LMP 01/04/2010 (LMP Unknown)   SpO2 95%   BMI 38.08 kg/m  General: non toxic with hacking harsh cough and no respiratory distress Psych:  Alert and oriented, normal mood and affect HEENT:  Normocephalic, atraumatic, supple neck, moist mucous membranes, mildly erythematous pharynx without exudate, mild lymphadenopathy, supple neck Cardiovascular:  RRR without murmur. no edema Respiratory:  fair breath sounds bilaterally, exp wheezing throughout w/o rales, + rhonchi  Office Visit on 12/31/2022  Component Date Value Ref Range Status   SARS Coronavirus 2 Ag 12/31/2022 Negative  Negative Final    Commons side effects, risks, benefits, and alternatives for medications and treatment plan prescribed today were discussed, and the patient expressed understanding of the given instructions. Patient is instructed to call or message via MyChart if he/she has any questions or concerns regarding our treatment plan. No barriers to understanding were identified. We discussed Red Flag symptoms and signs in detail. Patient expressed understanding regarding what to do in case of urgent or emergency type symptoms.  Medication list was reconciled, printed and provided to the patient in AVS. Patient instructions and summary information was reviewed with the patient as documented in the AVS. This note was prepared with assistance of Dragon voice recognition software.  Occasional wrong-word or sound-a-like substitutions may have occurred due to the inherent limitations of voice recognition software

## 2023-01-03 ENCOUNTER — Other Ambulatory Visit: Payer: Self-pay | Admitting: *Deleted

## 2023-01-03 ENCOUNTER — Encounter: Payer: Self-pay | Admitting: Family Medicine

## 2023-01-03 DIAGNOSIS — R051 Acute cough: Secondary | ICD-10-CM

## 2023-01-03 MED ORDER — CEFDINIR 300 MG PO CAPS: 300.0000 mg | ORAL_CAPSULE | Freq: Two times a day (BID) | ORAL | 0 refills | Status: AC

## 2023-01-03 MED ORDER — PREDNISONE 10 MG PO TABS: ORAL_TABLET | ORAL | 0 refills | Status: DC

## 2023-01-07 ENCOUNTER — Other Ambulatory Visit: Payer: Self-pay | Admitting: Physician Assistant

## 2023-01-07 ENCOUNTER — Other Ambulatory Visit: Payer: Self-pay | Admitting: Family Medicine

## 2023-01-07 MED ORDER — ALBUTEROL SULFATE (2.5 MG/3ML) 0.083% IN NEBU: 2.5000 mg | INHALATION_SOLUTION | Freq: Four times a day (QID) | RESPIRATORY_TRACT | 1 refills | Status: AC | PRN

## 2023-01-07 MED ORDER — PANTOPRAZOLE SODIUM 40 MG PO TBEC: 40.0000 mg | DELAYED_RELEASE_TABLET | Freq: Two times a day (BID) | ORAL | 0 refills | Status: DC

## 2023-01-07 MED ORDER — BENZONATATE 100 MG PO CAPS: 100.0000 mg | ORAL_CAPSULE | Freq: Three times a day (TID) | ORAL | 3 refills | Status: DC | PRN

## 2023-01-16 ENCOUNTER — Ambulatory Visit: Payer: Commercial Managed Care - PPO | Admitting: Family

## 2023-02-02 ENCOUNTER — Other Ambulatory Visit: Payer: Self-pay | Admitting: Family Medicine

## 2023-02-02 NOTE — Telephone Encounter (Signed)
 Due for appt

## 2023-02-07 ENCOUNTER — Encounter: Payer: Self-pay | Admitting: Family Medicine

## 2023-02-10 ENCOUNTER — Other Ambulatory Visit: Payer: Self-pay | Admitting: *Deleted

## 2023-02-10 DIAGNOSIS — L989 Disorder of the skin and subcutaneous tissue, unspecified: Secondary | ICD-10-CM

## 2023-02-26 ENCOUNTER — Encounter: Payer: Self-pay | Admitting: Family Medicine

## 2023-02-26 ENCOUNTER — Encounter: Payer: Self-pay | Admitting: Dermatology

## 2023-02-26 ENCOUNTER — Ambulatory Visit: Payer: Commercial Managed Care - PPO | Admitting: Dermatology

## 2023-02-26 VITALS — BP 129/63 | HR 66

## 2023-02-26 DIAGNOSIS — D485 Neoplasm of uncertain behavior of skin: Secondary | ICD-10-CM

## 2023-02-26 DIAGNOSIS — F419 Anxiety disorder, unspecified: Secondary | ICD-10-CM

## 2023-02-26 DIAGNOSIS — D492 Neoplasm of unspecified behavior of bone, soft tissue, and skin: Secondary | ICD-10-CM

## 2023-02-26 MED ORDER — MUPIROCIN 2 % EX OINT: 1.0000 | TOPICAL_OINTMENT | Freq: Two times a day (BID) | CUTANEOUS | 2 refills | Status: DC

## 2023-02-26 NOTE — Patient Instructions (Signed)
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
## 2023-03-26 ENCOUNTER — Ambulatory Visit: Payer: Commercial Managed Care - PPO | Admitting: Dermatology

## 2023-04-01 ENCOUNTER — Ambulatory Visit: Admitting: Dermatology

## 2023-04-01 ENCOUNTER — Encounter: Payer: Self-pay | Admitting: Dermatology

## 2023-04-01 DIAGNOSIS — D492 Neoplasm of unspecified behavior of bone, soft tissue, and skin: Secondary | ICD-10-CM

## 2023-04-01 DIAGNOSIS — F419 Anxiety disorder, unspecified: Secondary | ICD-10-CM

## 2023-04-01 DIAGNOSIS — D485 Neoplasm of uncertain behavior of skin: Secondary | ICD-10-CM

## 2023-04-01 NOTE — Patient Instructions (Addendum)
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
## 2023-04-01 NOTE — Progress Notes (Signed)
   Follow-Up Visit   Subjective  Sandra Freeman is a 63 y.o. female who presents for the following: Traumatic healing wound follow up - treating with Mupirocin twice daily. Following up from 02/26/2023. She states that she feels like things are improving. She would like to postpone any procedure today.  The following portions of the chart were reviewed this encounter and updated as appropriate: medications, allergies, medical history  Review of Systems:  No other skin or systemic complaints except as noted in HPI or Assessment and Plan.  Objective  Well appearing patient in no apparent distress; mood and affect are within normal limits.   A focused examination was performed of the following areas: Face  Relevant exam findings are noted in the Assessment and Plan.     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  We will re-evaluate on follow up and we will decide at that appointment if she will need a biopsy.  Procedure Related Anxiety The patient expressed significant anxiety today regarding the examination and discussion of potential procedures for the lesion. Concerns were acknowledged, and reassurance was provided throughout the visit. No attempts were made to proceed with any procedures at this time. Instead, the focus was placed on the current treatment plan, including the use of mupirocin ointment, with guidance on wound care and monitoring for improvement. The discussion regarding a potential biopsy was postponed until the next visit, depending on the lesion's response to treatment. The patient was encouraged to reach out with any concerns and was advised to follow up as scheduled for reassessment.    Return in about 1 month (around 05/02/2023) for Follow up.  I, Joanie Coddington, CMA, am acting as scribe for Gwenith Daily, MD .   Documentation: I have reviewed the  above documentation for accuracy and completeness, and I agree with the above.  Gwenith Daily, MD

## 2023-04-04 ENCOUNTER — Ambulatory Visit: Payer: Commercial Managed Care - PPO | Admitting: Family Medicine

## 2023-04-04 DIAGNOSIS — F411 Generalized anxiety disorder: Secondary | ICD-10-CM

## 2023-04-04 DIAGNOSIS — F320 Major depressive disorder, single episode, mild: Secondary | ICD-10-CM

## 2023-05-07 ENCOUNTER — Ambulatory Visit: Admitting: Dermatology

## 2023-05-07 ENCOUNTER — Encounter: Payer: Self-pay | Admitting: Dermatology

## 2023-05-07 DIAGNOSIS — D492 Neoplasm of unspecified behavior of bone, soft tissue, and skin: Secondary | ICD-10-CM

## 2023-05-07 DIAGNOSIS — D485 Neoplasm of uncertain behavior of skin: Secondary | ICD-10-CM

## 2023-05-07 DIAGNOSIS — F419 Anxiety disorder, unspecified: Secondary | ICD-10-CM

## 2023-05-07 NOTE — Progress Notes (Unsigned)
   Follow-Up Visit   Subjective  Sandra Freeman is a 63 y.o. female who presents for the following: growth of left cheek; following up from previous visit on 04/01/2023.  Pt has been using mupirocin and thinks spot has improved. She would like to watch and wait at this time and postpone biopsy.   The following portions of the chart were reviewed this encounter and updated as appropriate: medications, allergies, medical history  Review of Systems:  No other skin or systemic complaints except as noted in HPI or Assessment and Plan.  Objective  Well appearing patient in no apparent distress; mood and affect are within normal limits.  A focused examination was performed of the following areas: Left cheek  Relevant exam findings are noted in the Assessment and Plan.    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   Procedure Related Anxiety The patient expressed significant anxiety regarding the examination and discussion of potential procedures for the lesion. Concerns were acknowledged, and reassurance was provided throughout the visit. No attempts were made to proceed with any procedures at this time. Instead, the focus was placed on the current treatment plan, including the use of mupirocin ointment, with guidance on wound care and monitoring for improvement. The discussion regarding a potential biopsy was postponed until the next visit, depending on the lesion's response to treatment. The patient was encouraged to reach out with any concerns and was advised to follow up as scheduled for reassessment.    No follow-ups on file.  I, Wilson Hasten, CMA, am acting as scribe for Deneise Finlay, MD.   Documentation: I have reviewed the above documentation for accuracy and completeness, and I agree with the above.  Deneise Finlay,  MD

## 2023-05-07 NOTE — Patient Instructions (Signed)

## 2023-06-05 ENCOUNTER — Ambulatory Visit: Admitting: Family Medicine

## 2023-06-06 ENCOUNTER — Ambulatory Visit: Admitting: Family Medicine

## 2023-06-06 ENCOUNTER — Encounter: Payer: Self-pay | Admitting: Family Medicine

## 2023-06-06 VITALS — BP 120/80 | HR 70 | Temp 98.7°F | Resp 16 | Ht 60.0 in | Wt 193.0 lb

## 2023-06-06 DIAGNOSIS — D696 Thrombocytopenia, unspecified: Secondary | ICD-10-CM | POA: Diagnosis not present

## 2023-06-06 DIAGNOSIS — J4520 Mild intermittent asthma, uncomplicated: Secondary | ICD-10-CM

## 2023-06-06 DIAGNOSIS — E538 Deficiency of other specified B group vitamins: Secondary | ICD-10-CM | POA: Diagnosis not present

## 2023-06-06 DIAGNOSIS — E785 Hyperlipidemia, unspecified: Secondary | ICD-10-CM | POA: Diagnosis not present

## 2023-06-06 DIAGNOSIS — K219 Gastro-esophageal reflux disease without esophagitis: Secondary | ICD-10-CM

## 2023-06-06 DIAGNOSIS — F4310 Post-traumatic stress disorder, unspecified: Secondary | ICD-10-CM

## 2023-06-06 LAB — CBC
HCT: 41.3 % (ref 36.0–46.0)
Hemoglobin: 14.2 g/dL (ref 12.0–15.0)
MCHC: 34.3 g/dL (ref 30.0–36.0)
MCV: 87.5 fl (ref 78.0–100.0)
Platelets: 120 10*3/uL — ABNORMAL LOW (ref 150.0–400.0)
RBC: 4.72 Mil/uL (ref 3.87–5.11)
RDW: 12.8 % (ref 11.5–15.5)
WBC: 5.2 10*3/uL (ref 4.0–10.5)

## 2023-06-06 LAB — COMPREHENSIVE METABOLIC PANEL WITH GFR
ALT: 27 U/L (ref 0–35)
AST: 28 U/L (ref 0–37)
Albumin: 4.4 g/dL (ref 3.5–5.2)
Alkaline Phosphatase: 74 U/L (ref 39–117)
BUN: 13 mg/dL (ref 6–23)
CO2: 28 meq/L (ref 19–32)
Calcium: 9.5 mg/dL (ref 8.4–10.5)
Chloride: 105 meq/L (ref 96–112)
Creatinine, Ser: 0.84 mg/dL (ref 0.40–1.20)
GFR: 73.02 mL/min (ref >60.00)
Glucose, Bld: 94 mg/dL (ref 70–99)
Potassium: 3.9 meq/L (ref 3.5–5.1)
Sodium: 142 meq/L (ref 135–145)
Total Bilirubin: 1.1 mg/dL (ref 0.2–1.2)
Total Protein: 7.1 g/dL (ref 6.0–8.3)

## 2023-06-06 LAB — LIPID PANEL
Cholesterol: 160 mg/dL (ref 0–200)
HDL: 55.5 mg/dL (ref >39.00)
LDL Cholesterol: 88 mg/dL (ref 0–99)
NonHDL: 104.7
Total CHOL/HDL Ratio: 3
Triglycerides: 83 mg/dL (ref 0.0–149.0)
VLDL: 16.6 mg/dL (ref 0.0–40.0)

## 2023-06-06 MED ORDER — PANTOPRAZOLE SODIUM 40 MG PO TBEC: 40.0000 mg | DELAYED_RELEASE_TABLET | Freq: Every day | ORAL | 1 refills | Status: DC

## 2023-06-06 NOTE — Assessment & Plan Note (Signed)
 Continue nonpharmacologic treatment. Further recommendations will be given according to 10 years CVD risk score and lipid panel numbers.

## 2023-06-06 NOTE — Assessment & Plan Note (Signed)
 Currently she is on nonpharmacologic treatment. She has seen psychiatry in the past and has had CBT. She feels like problem has improved through the years and she is able to manage acute episode with relaxation exercises.

## 2023-06-06 NOTE — Assessment & Plan Note (Signed)
 Problem is adequately controlled. Continue pantoprazole  40 mg 30 minutes before breakfast and GERD precautions.

## 2023-06-06 NOTE — Assessment & Plan Note (Signed)
 Currently she is asymptomatic. Continue albuterol  inhaler 2 puff every 6 hours as needed.

## 2023-06-06 NOTE — Patient Instructions (Addendum)
 A few things to remember from today's visit:  Gastroesophageal reflux disease without esophagitis  B12 deficiency - Plan: CBC, Vitamin B12  Thrombocytopenia (HCC) - Plan: CBC  PTSD (post-traumatic stress disorder)  Mild intermittent asthma without complication  Hyperlipidemia, unspecified hyperlipidemia type - Plan: Comprehensive metabolic panel with GFR, Lipid panel  Continue Pantoprazole  40 mg before breakfast.  If you need refills for medications you take chronically, please call your pharmacy. Do not use My Chart to request refills or for acute issues that need immediate attention. If you send a my chart message, it may take a few days to be addressed, specially if I am not in the office.  Please be sure medication list is accurate. If a new problem present, please set up appointment sooner than planned today.
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
## 2023-06-06 NOTE — Assessment & Plan Note (Signed)
 Mild and asymptomatic. She has seen hematologist in the past. Further recommendation will be given according to CBC result.

## 2023-06-06 NOTE — Assessment & Plan Note (Signed)
 Currently she is not on B12 supplementation. Further recommendation will be given according to B12 result.

## 2023-06-10 ENCOUNTER — Encounter: Payer: Self-pay | Admitting: Family Medicine

## 2023-06-10 LAB — VITAMIN B12: Vitamin B-12: 424 pg/mL (ref 211–911)

## 2023-06-11 ENCOUNTER — Other Ambulatory Visit: Payer: Self-pay | Admitting: Family Medicine

## 2023-06-11 ENCOUNTER — Ambulatory Visit: Payer: Self-pay | Admitting: Family Medicine

## 2023-06-11 DIAGNOSIS — Z1231 Encounter for screening mammogram for malignant neoplasm of breast: Secondary | ICD-10-CM

## 2023-07-02 ENCOUNTER — Ambulatory Visit
Admission: RE | Admit: 2023-07-02 | Discharge: 2023-07-02 | Disposition: A | Discharge status: Home / Self Care | Source: Ambulatory Visit | Attending: Family Medicine | Admitting: Family Medicine

## 2023-07-02 DIAGNOSIS — Z1231 Encounter for screening mammogram for malignant neoplasm of breast: Secondary | ICD-10-CM

## 2023-07-14 ENCOUNTER — Ambulatory Visit: Admitting: Dermatology

## 2023-07-14 ENCOUNTER — Encounter: Payer: Self-pay | Admitting: Dermatology

## 2023-07-14 DIAGNOSIS — F419 Anxiety disorder, unspecified: Secondary | ICD-10-CM | POA: Diagnosis not present

## 2023-07-14 DIAGNOSIS — D492 Neoplasm of unspecified behavior of bone, soft tissue, and skin: Secondary | ICD-10-CM

## 2023-07-14 DIAGNOSIS — D485 Neoplasm of uncertain behavior of skin: Secondary | ICD-10-CM

## 2023-07-14 DIAGNOSIS — L905 Scar conditions and fibrosis of skin: Secondary | ICD-10-CM

## 2023-07-14 NOTE — Progress Notes (Signed)
   Follow-Up Visit   Subjective  Sandra Freeman is a 62 y.o. female who presents for the following: growth of left cheek; following up from previous visit on 05/07/2023.   Pt has been using mupirocin  and thinks spot has improved.  She is currently struggling with panic attacks and would like to wait and wait at this time and postpone biopsy. She states that she would rather be put under general anesthesia if possible due to her anxiety.   The following portions of the chart were reviewed this encounter and updated as appropriate: medications, allergies, medical history  Review of Systems:  No other skin or systemic complaints except as noted in HPI or Assessment and Plan.  Objective  Well appearing patient in no apparent distress; mood and affect are within normal limits.   A focused examination was performed of the following areas: Right cheek  Relevant exam findings are noted in the Assessment and Plan.    Assessment & Plan   Neoplasm of Skin- Ddx traumatic healing wound- improved- cannot rule out NMSC  Exam: Healed ulcer with scar and superior rolled border   Treatment Plan: Patient to discontinue Mupirocin . Discussed that a biopsy is warranted due to clinic suspicion for a BCC. Discussed that we could provide anti-anxiety medication prior to a biopsy here in our office. Advised that a plastic surgeon would be able to sample and repair the defect under general anaesthesia. We discussed with the patient the difference in treatment that would be delivered by a plastic surgeon vs a mohs surgeon. Discussed that without Mohs surgery, there is a risk of positive margins after excision if lesion does end up being a skin cancer.    Procedure Related Anxiety The patient expressed significant anxiety regarding the examination and discussion of potential procedures for the lesion. Concerns were acknowledged, and reassurance was provided throughout the visit. No attempts were made to proceed  with any procedures at this time. Instead, the focus was placed on the current treatment plan, including the use of mupirocin  ointment, with guidance on wound care and monitoring for improvement. The discussion regarding a potential biopsy was postponed until the next visit, depending on the lesion's response to treatment. The patient was encouraged to reach out with any concerns and was advised to follow up as scheduled for reassessment.    Return in about 3 months (around 10/14/2023) for follow up on healed ulcer on the right cheek.  LILLETTE Rollene Gobble, RN, am acting as scribe for RUFUS CHRISTELLA HOLY, MD .   Documentation: I have reviewed the above documentation for accuracy and completeness, and I agree with the above.  RUFUS CHRISTELLA HOLY, MD

## 2023-07-14 NOTE — Patient Instructions (Addendum)
 Plastic Surgeon: Dr. Estefana Fritter and Dr. Leonce Birmingham [phone] Important Information  Due to recent changes in healthcare laws, you may see results of your pathology and/or laboratory studies on MyChart before the doctors have had a chance to review them. We understand that in some cases there may be results that are confusing or concerning to you. Please understand that not all results are received at the same time and often the doctors may need to interpret multiple results in order to provide you with the best plan of care or course of treatment. Therefore, we ask that you please give us  2 business days to thoroughly review all your results before contacting the office for clarification. Should we see a critical lab result, you will be contacted sooner.   If You Need Anything After Your Visit  If you have any questions or concerns for your doctor, please call our main line at [phone] If no one answers, please leave a voicemail as directed and we will return your call as soon as possible. Messages left after 4 pm will be answered the following business day.   You may also send us  a message via MyChart. We typically respond to MyChart messages within 1-2 business days.  For prescription refills, please ask your pharmacy to contact our office. Our fax number is [phone].  If you have an urgent issue when the clinic is closed that cannot wait until the next business day, you can page your doctor at the number below.    Please note that while we do our best to be available for urgent issues outside of office hours, we are not available 24/7.   If you have an urgent issue and are unable to reach us , you may choose to seek medical care at your doctor's office, retail clinic, urgent care center, or emergency room.  If you have a medical emergency, please immediately call 911 or go to the emergency department. In the event of inclement weather, please call our main line at  [phone] for an update on the status of any delays or closures.  Dermatology Medication Tips: Please keep the boxes that topical medications come in in order to help keep track of the instructions about where and how to use these. Pharmacies typically print the medication instructions only on the boxes and not directly on the medication tubes.   If your medication is too expensive, please contact our office at [phone] or send us  a message through MyChart.   We are unable to tell what your co-pay for medications will be in advance as this is different depending on your insurance coverage. However, we may be able to find a substitute medication at lower cost or fill out paperwork to get insurance to cover a needed medication.   If a prior authorization is required to get your medication covered by your insurance company, please allow us  1-2 business days to complete this process.  Drug prices often vary depending on where the prescription is filled and some pharmacies may offer cheaper prices.  The website www.goodrx.com contains coupons for medications through different pharmacies. The prices here do not account for what the cost may be with help from insurance (it may be cheaper with your insurance), but the website can give you the price if you did not use any insurance.  - You can print the associated coupon and take it with your prescription to the pharmacy.  - You may also stop by our office during regular business hours  and pick up a GoodRx coupon card.  - If you need your prescription sent electronically to a different pharmacy, notify our office through [facility] or by phone at [phone]

## 2023-07-22 ENCOUNTER — Encounter: Payer: Self-pay | Admitting: Family Medicine

## 2023-07-22 ENCOUNTER — Ambulatory Visit: Admitting: Family Medicine

## 2023-07-22 VITALS — BP 136/60 | HR 64 | Temp 98.4°F | Wt 193.8 lb

## 2023-07-22 DIAGNOSIS — M545 Low back pain, unspecified: Secondary | ICD-10-CM | POA: Diagnosis not present

## 2023-07-22 MED ORDER — CYCLOBENZAPRINE HCL 5 MG PO TABS: 5.0000 mg | ORAL_TABLET | Freq: Three times a day (TID) | ORAL | 1 refills | Status: AC | PRN

## 2023-07-22 NOTE — Patient Instructions (Signed)
 Make take up to two Flexeril  every 8 hours as needed for muscle spasm.    Consider trial of PT if no better in 2-3 weeks.
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
## 2023-07-31 ENCOUNTER — Encounter: Payer: Self-pay | Admitting: Family Medicine

## 2023-08-08 ENCOUNTER — Ambulatory Visit: Admitting: Family Medicine

## 2023-08-11 ENCOUNTER — Encounter: Payer: Self-pay | Admitting: Family Medicine

## 2023-08-11 ENCOUNTER — Ambulatory Visit (INDEPENDENT_AMBULATORY_CARE_PROVIDER_SITE_OTHER)

## 2023-08-11 ENCOUNTER — Ambulatory Visit: Payer: Self-pay | Admitting: Family Medicine

## 2023-08-11 ENCOUNTER — Ambulatory Visit: Admitting: Family Medicine

## 2023-08-11 VITALS — BP 128/70 | HR 64 | Temp 98.5°F | Resp 16 | Ht 60.0 in | Wt 192.0 lb

## 2023-08-11 DIAGNOSIS — R202 Paresthesia of skin: Secondary | ICD-10-CM | POA: Diagnosis not present

## 2023-08-11 DIAGNOSIS — M545 Low back pain, unspecified: Secondary | ICD-10-CM | POA: Diagnosis not present

## 2023-08-11 DIAGNOSIS — R200 Anesthesia of skin: Secondary | ICD-10-CM

## 2023-08-11 MED ORDER — PREDNISONE 20 MG PO TABS: ORAL_TABLET | ORAL | 0 refills | Status: DC
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
## 2023-08-12 ENCOUNTER — Other Ambulatory Visit: Payer: Self-pay | Admitting: Family Medicine

## 2023-08-12 ENCOUNTER — Ambulatory Visit: Admitting: Rehabilitative and Restorative Service Providers"

## 2023-08-12 DIAGNOSIS — K219 Gastro-esophageal reflux disease without esophagitis: Secondary | ICD-10-CM

## 2023-08-13 ENCOUNTER — Other Ambulatory Visit: Payer: Self-pay | Admitting: Dermatology

## 2023-08-13 DIAGNOSIS — D485 Neoplasm of uncertain behavior of skin: Secondary | ICD-10-CM

## 2023-08-14 ENCOUNTER — Other Ambulatory Visit: Payer: Self-pay

## 2023-08-14 ENCOUNTER — Encounter: Payer: Self-pay | Admitting: Rehabilitation

## 2023-08-14 ENCOUNTER — Ambulatory Visit: Attending: Family Medicine | Admitting: Rehabilitation

## 2023-08-14 DIAGNOSIS — M5136 Other intervertebral disc degeneration, lumbar region with discogenic back pain only: Secondary | ICD-10-CM | POA: Diagnosis present

## 2023-08-14 DIAGNOSIS — M5459 Other low back pain: Secondary | ICD-10-CM
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
## 2023-08-18 ENCOUNTER — Encounter: Payer: Self-pay | Admitting: Plastic Surgery

## 2023-08-18 ENCOUNTER — Ambulatory Visit: Admitting: Plastic Surgery

## 2023-08-18 VITALS — BP 117/71 | HR 60 | Ht 60.0 in | Wt 192.8 lb

## 2023-08-18 DIAGNOSIS — L905 Scar conditions and fibrosis of skin: Secondary | ICD-10-CM | POA: Diagnosis not present

## 2023-08-18 DIAGNOSIS — L2084 Intrinsic (allergic) eczema: Secondary | ICD-10-CM
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
## 2023-08-21 ENCOUNTER — Ambulatory Visit: Admitting: Rehabilitative and Restorative Service Providers"

## 2023-08-22 ENCOUNTER — Ambulatory Visit: Admitting: Physical Therapy

## 2023-08-28 ENCOUNTER — Ambulatory Visit: Admitting: Rehabilitation

## 2023-09-08 ENCOUNTER — Encounter: Payer: Self-pay | Admitting: Family Medicine

## 2023-09-08 DIAGNOSIS — R200 Anesthesia of skin: Secondary | ICD-10-CM

## 2023-09-08 DIAGNOSIS — M545 Low back pain, unspecified: Secondary | ICD-10-CM

## 2023-09-09 ENCOUNTER — Encounter: Admitting: Physical Therapy

## 2023-09-11 ENCOUNTER — Encounter: Admitting: Physical Therapy

## 2023-09-14 ENCOUNTER — Ambulatory Visit
Admission: RE | Admit: 2023-09-14 | Discharge: 2023-09-14 | Disposition: A | Discharge status: Home / Self Care | Source: Ambulatory Visit | Attending: Family Medicine | Admitting: Family Medicine

## 2023-09-14 DIAGNOSIS — R200 Anesthesia of skin: Secondary | ICD-10-CM

## 2023-09-14 DIAGNOSIS — M545 Low back pain, unspecified: Secondary | ICD-10-CM

## 2023-09-16 ENCOUNTER — Encounter: Admitting: Physical Therapy

## 2023-09-18 ENCOUNTER — Encounter: Admitting: Physical Therapy

## 2023-09-23 ENCOUNTER — Encounter: Payer: Self-pay | Admitting: Family Medicine

## 2023-09-23 DIAGNOSIS — R200 Anesthesia of skin: Secondary | ICD-10-CM

## 2023-09-23 DIAGNOSIS — M4800 Spinal stenosis, site unspecified: Secondary | ICD-10-CM

## 2023-09-23 DIAGNOSIS — M545 Low back pain, unspecified: Secondary | ICD-10-CM

## 2023-09-24 ENCOUNTER — Ambulatory Visit: Payer: Self-pay | Admitting: Family Medicine

## 2023-10-14 ENCOUNTER — Ambulatory Visit: Admitting: Dermatology

## 2023-10-20 ENCOUNTER — Ambulatory Visit: Admitting: Physical Medicine and Rehabilitation

## 2023-10-20 ENCOUNTER — Encounter: Payer: Self-pay | Admitting: Physical Medicine and Rehabilitation

## 2023-10-20 DIAGNOSIS — M5441 Lumbago with sciatica, right side: Secondary | ICD-10-CM

## 2023-10-20 DIAGNOSIS — M5416 Radiculopathy, lumbar region: Secondary | ICD-10-CM

## 2023-10-20 DIAGNOSIS — G8929 Other chronic pain: Secondary | ICD-10-CM

## 2023-10-20 DIAGNOSIS — M5442 Lumbago with sciatica, left side: Secondary | ICD-10-CM | POA: Diagnosis not present

## 2023-10-20 DIAGNOSIS — M48062 Spinal stenosis, lumbar region with neurogenic claudication: Secondary | ICD-10-CM | POA: Diagnosis not present
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
## 2023-10-20 NOTE — Progress Notes (Unsigned)
 Core Outcome Measures Index (COMI) Back Score  Average Pain 8  COMI Score 80 %

## 2023-10-20 NOTE — Progress Notes (Unsigned)
 Pain Scale   Average Pain 1 Patient advising she has lower back pain radiating to her legs causing numbness in her feet bilaterally.        +Driver, -BT, -Dye Allergies.

## 2023-11-07 ENCOUNTER — Encounter: Payer: Self-pay | Admitting: Family Medicine

## 2023-11-10 ENCOUNTER — Other Ambulatory Visit: Payer: Self-pay

## 2023-11-10 DIAGNOSIS — M549 Dorsalgia, unspecified: Secondary | ICD-10-CM

## 2023-11-10 NOTE — Telephone Encounter (Signed)
[  It seems like she was offered initial treatment with injections and apparently she was not interested.] New referral to orthopedist can be placed as requested. Neurosurgeon is also an option. Thanks, BJ

## 2023-11-11 ENCOUNTER — Other Ambulatory Visit: Payer: Self-pay

## 2023-11-11 DIAGNOSIS — M4800 Spinal stenosis, site unspecified: Secondary | ICD-10-CM

## 2023-11-11 DIAGNOSIS — R200 Anesthesia of skin: Secondary | ICD-10-CM

## 2023-11-11 DIAGNOSIS — M545 Low back pain, unspecified: Secondary | ICD-10-CM

## 2023-11-11 DIAGNOSIS — M549 Dorsalgia, unspecified: Secondary | ICD-10-CM

## 2023-11-19 ENCOUNTER — Encounter: Payer: Self-pay | Admitting: Orthopedic Surgery

## 2023-11-19 ENCOUNTER — Ambulatory Visit: Admitting: Dermatology

## 2023-11-19 ENCOUNTER — Encounter: Payer: Self-pay | Admitting: Dermatology

## 2023-11-19 DIAGNOSIS — L905 Scar conditions and fibrosis of skin: Secondary | ICD-10-CM
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
## 2023-11-19 NOTE — Patient Instructions (Addendum)

## 2023-11-20 ENCOUNTER — Encounter: Payer: Self-pay | Admitting: Orthopedic Surgery

## 2023-11-20 ENCOUNTER — Ambulatory Visit: Admitting: Orthopedic Surgery

## 2023-11-20 VITALS — BP 118/62 | Ht 60.0 in | Wt 187.1 lb

## 2023-11-20 DIAGNOSIS — M4726 Other spondylosis with radiculopathy, lumbar region: Secondary | ICD-10-CM | POA: Diagnosis not present

## 2023-11-20 DIAGNOSIS — M47816 Spondylosis without myelopathy or radiculopathy, lumbar region: Secondary | ICD-10-CM

## 2023-11-20 DIAGNOSIS — M4316 Spondylolisthesis, lumbar region: Secondary | ICD-10-CM | POA: Diagnosis not present

## 2023-11-20 DIAGNOSIS — M48062 Spinal stenosis, lumbar region with neurogenic claudication: Secondary | ICD-10-CM | POA: Diagnosis not present

## 2023-11-20 DIAGNOSIS — M5416 Radiculopathy, lumbar region: Secondary | ICD-10-CM

## 2023-11-20 NOTE — Patient Instructions (Signed)
 It was so nice to see you today. Thank you so much for coming in.    You have wear and tear in your back with a slip at L4-L5 and spinal stenosis (pressure on spinal cord).   I recommend restarting physical therapy for your lower back. If they need new orders then let me know.   You will need to do at least 6 weeks of PT or be discharged by physical therapist prior to discussing any surgery options for your back.   You have a phone visit scheduled with me in January. Please do not hesitate to call if you have any questions or concerns. You can also message me in MyChart.   Glade Boys PA-C [phone]     The physicians and staff at [facility] at [facility] are committed to providing excellent care. You may receive a survey asking for feedback about your experience at our office. We value you your feedback and appreciate you taking the time to to fill it out. The [facility] leadership team is also available to discuss your experience in person, feel free to contact us  [phone].

## 2023-11-24 ENCOUNTER — Encounter: Payer: Self-pay | Admitting: Radiology

## 2023-12-17 ENCOUNTER — Encounter: Admitting: Orthopedic Surgery

## 2024-01-19 ENCOUNTER — Encounter: Admitting: Orthopedic Surgery

## 2024-01-29 ENCOUNTER — Ambulatory Visit: Payer: Self-pay

## 2024-01-29 NOTE — Telephone Encounter (Signed)
 FYI Only or Action Required?: FYI only for provider: appointment scheduled on 01/30/24.  Patient was last seen in primary care on 08/11/2023 by Jordan, Betty G, MD.  Called Nurse Triage reporting Cough.  Symptoms began several days ago.  Interventions attempted: OTC medications: Mucinex .  Symptoms are: unchanged.  Triage Disposition: See Physician Within 24 Hours  Patient/caregiver understands and will follow disposition?: Yes   Copied from CRM #[id number]. Topic: Clinical - Red Word Triage >> Jan 29, 2024  9:03 AM Deleta RAMAN wrote: Red Word that prompted transfer to Nurse Triage: patient is sick states cough is going into her chest. serve asthmatic and states she normally deal with this yearly. asthma turns into bronchitis Reason for Disposition  [1] Continuous (nonstop) coughing interferes with work or school AND [2] no improvement using cough treatment per Care Advice  Answer Assessment - Initial Assessment Questions Scheduled 01/30/24  Advised call back or ED/911 if symptoms worsen. Patient verbalized understanding.    1. ONSET: When did the cough begin?      Saturday 2. SEVERITY: How bad is the cough today?      moderate 3. SPUTUM: Describe the color of your sputum (e.g., none, dry cough; clear, white, yellow, green)     Dark yellow 4. HEMOPTYSIS: Are you coughing up any blood? If Yes, ask: How much? (e.g., flecks, streaks, tablespoons, etc.)     no 5. DIFFICULTY BREATHING: Are you having difficulty breathing? If Yes, ask: How bad is it? (e.g., mild, moderate, severe)      no 6. FEVER: Do you have a fever? If Yes, ask: What is your temperature, how was it measured, and when did it start?     Low grade fever, 100,  7. CARDIAC HISTORY: Do you have any history of heart disease? (e.g., heart attack, congestive heart failure)      no 8. LUNG HISTORY: Do you have any history of lung disease?  (e.g., pulmonary embolus, asthma, emphysema)     asthma 9. PE RISK  FACTORS: Do you have a history of blood clots? (or: recent major surgery, recent prolonged travel, bedridden)     no 10. OTHER SYMPTOMS: Do you have any other symptoms? (e.g., runny nose, wheezing, chest pain)     Sore throat, chest congestion denies diff breathing, chest pain, n/v, chills  Protocols used: Cough - Acute Productive-A-AH

## 2024-01-30 ENCOUNTER — Encounter: Payer: Self-pay | Admitting: Adult Health

## 2024-01-30 ENCOUNTER — Ambulatory Visit: Admitting: Adult Health

## 2024-01-30 ENCOUNTER — Telehealth: Admitting: Orthopedic Surgery

## 2024-01-30 VITALS — BP 138/82 | HR 72 | Temp 98.4°F | Ht 60.0 in | Wt 180.0 lb

## 2024-01-30 DIAGNOSIS — J069 Acute upper respiratory infection, unspecified: Secondary | ICD-10-CM | POA: Diagnosis not present

## 2024-01-30 DIAGNOSIS — J020 Streptococcal pharyngitis: Secondary | ICD-10-CM

## 2024-01-30 LAB — POCT INFLUENZA A/B
Influenza A, POC: NEGATIVE
Influenza B, POC: NEGATIVE

## 2024-01-30 LAB — POCT RAPID STREP A (OFFICE): Rapid Strep A Screen: POSITIVE — AB

## 2024-01-30 MED ORDER — PREDNISONE 10 MG PO TABS: ORAL_TABLET | ORAL | 0 refills | Status: AC

## 2024-01-30 MED ORDER — GUAIFENESIN-CODEINE 100-10 MG/5ML PO SOLN: 10.0000 mL | Freq: Three times a day (TID) | ORAL | 0 refills | Status: AC | PRN

## 2024-01-30 MED ORDER — AMOXICILLIN 500 MG PO CAPS: 500.0000 mg | ORAL_CAPSULE | Freq: Two times a day (BID) | ORAL | 0 refills | Status: AC
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
## 2024-02-03 ENCOUNTER — Encounter: Admitting: Physical Medicine and Rehabilitation

## 2024-02-06 ENCOUNTER — Encounter: Payer: Self-pay | Admitting: Physical Medicine and Rehabilitation

## 2024-02-06 ENCOUNTER — Ambulatory Visit: Admitting: Physical Medicine and Rehabilitation

## 2024-02-06 DIAGNOSIS — M5442 Lumbago with sciatica, left side: Secondary | ICD-10-CM | POA: Diagnosis not present

## 2024-02-06 DIAGNOSIS — M5441 Lumbago with sciatica, right side: Secondary | ICD-10-CM | POA: Diagnosis not present

## 2024-02-06 DIAGNOSIS — G8929 Other chronic pain: Secondary | ICD-10-CM

## 2024-02-06 DIAGNOSIS — M5416 Radiculopathy, lumbar region: Secondary | ICD-10-CM | POA: Diagnosis not present

## 2024-02-06 DIAGNOSIS — M48062 Spinal stenosis, lumbar region with neurogenic claudication: Secondary | ICD-10-CM

## 2024-02-06 DIAGNOSIS — M4316 Spondylolisthesis, lumbar region: Secondary | ICD-10-CM | POA: Diagnosis not present
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
## 2024-02-06 NOTE — Progress Notes (Unsigned)
 Pain Scale   Average Pain 8 Patient advising she thought she was seeing a back surgeon today..         +Driver, -BT, -Dye Allergies.

## 2024-02-08 ENCOUNTER — Encounter: Payer: Self-pay | Admitting: Adult Health

## 2024-02-10 ENCOUNTER — Telehealth: Payer: Self-pay | Admitting: Family Medicine

## 2024-02-10 NOTE — Telephone Encounter (Signed)
 Copied from CRM [id number]. Topic: General - Other >> Feb 10, 2024 11:11 AM Alfonso HERO wrote: Reason for CRM: patient calling in regards to the note she is needing the date for her to see the judge is in a couple of days. She is asking for a call back. She saw Cory and didn't know if that's who needs to provide her with the note.

## 2024-02-10 NOTE — Telephone Encounter (Signed)
 Please advise.

## 2024-02-22 ENCOUNTER — Telehealth: Payer: Self-pay

## 2024-02-22 NOTE — Telephone Encounter (Signed)
 MYCHART SENT ABOUT R/S APPT

## 2024-02-23 ENCOUNTER — Encounter: Admitting: Surgical

## 2024-02-25 ENCOUNTER — Encounter: Admitting: Orthopedic Surgery

## 2024-03-11 ENCOUNTER — Ambulatory Visit: Admitting: Orthopedic Surgery

## 2024-03-15 ENCOUNTER — Encounter: Admitting: Surgical

## 2024-05-24 ENCOUNTER — Ambulatory Visit: Admitting: Dermatology
